# Patient Record
Sex: Female | Born: 1940 | Race: White | Hispanic: No | Smoking: Former smoker
Health system: Southern US, Community
[De-identification: ages and names within clinical notes are randomized; demographics above are authoritative.]

## PROBLEM LIST (undated history)

## (undated) DIAGNOSIS — T8859XA Other complications of anesthesia, initial encounter: Secondary | ICD-10-CM

## (undated) DIAGNOSIS — C569 Malignant neoplasm of unspecified ovary: Secondary | ICD-10-CM

## (undated) DIAGNOSIS — I2510 Atherosclerotic heart disease of native coronary artery without angina pectoris: Secondary | ICD-10-CM

## (undated) DIAGNOSIS — M1990 Unspecified osteoarthritis, unspecified site: Secondary | ICD-10-CM

## (undated) DIAGNOSIS — J449 Chronic obstructive pulmonary disease, unspecified: Secondary | ICD-10-CM

## (undated) DIAGNOSIS — T4145XA Adverse effect of unspecified anesthetic, initial encounter: Secondary | ICD-10-CM

## (undated) DIAGNOSIS — Z972 Presence of dental prosthetic device (complete) (partial): Secondary | ICD-10-CM

## (undated) DIAGNOSIS — C801 Malignant (primary) neoplasm, unspecified: Secondary | ICD-10-CM

## (undated) DIAGNOSIS — K219 Gastro-esophageal reflux disease without esophagitis: Secondary | ICD-10-CM

## (undated) DIAGNOSIS — I499 Cardiac arrhythmia, unspecified: Secondary | ICD-10-CM

## (undated) DIAGNOSIS — E7800 Pure hypercholesterolemia, unspecified: Secondary | ICD-10-CM

## (undated) HISTORY — PX: OTHER SURGICAL HISTORY: SHX169

## (undated) HISTORY — PX: ABDOMINAL HYSTERECTOMY: SHX81

## (undated) HISTORY — PX: TONSILLECTOMY: SUR1361

---

## 1970-04-19 DIAGNOSIS — C569 Malignant neoplasm of unspecified ovary: Secondary | ICD-10-CM

## 1970-04-19 HISTORY — DX: Malignant neoplasm of unspecified ovary: C56.9

## 2005-06-11 ENCOUNTER — Ambulatory Visit: Payer: Self-pay | Admitting: Podiatry

## 2007-04-20 HISTORY — PX: LUNG LOBECTOMY: SHX167

## 2008-04-19 DIAGNOSIS — I2510 Atherosclerotic heart disease of native coronary artery without angina pectoris: Secondary | ICD-10-CM

## 2008-04-19 DIAGNOSIS — C801 Malignant (primary) neoplasm, unspecified: Secondary | ICD-10-CM

## 2008-04-19 HISTORY — DX: Atherosclerotic heart disease of native coronary artery without angina pectoris: I25.10

## 2008-04-19 HISTORY — DX: Malignant (primary) neoplasm, unspecified: C80.1

## 2008-05-17 ENCOUNTER — Ambulatory Visit: Payer: Self-pay | Admitting: Family Medicine

## 2008-05-20 ENCOUNTER — Ambulatory Visit: Payer: Self-pay | Admitting: Oncology

## 2008-05-27 ENCOUNTER — Ambulatory Visit: Payer: Self-pay | Admitting: Oncology

## 2008-05-30 ENCOUNTER — Ambulatory Visit: Payer: Self-pay | Admitting: Oncology

## 2008-06-05 ENCOUNTER — Ambulatory Visit: Payer: Self-pay | Admitting: Surgery

## 2008-06-12 ENCOUNTER — Inpatient Hospital Stay: Payer: Self-pay | Admitting: Surgery

## 2008-06-17 ENCOUNTER — Ambulatory Visit: Payer: Self-pay | Admitting: Oncology

## 2008-06-21 ENCOUNTER — Ambulatory Visit: Payer: Self-pay | Admitting: Oncology

## 2008-07-03 ENCOUNTER — Ambulatory Visit: Payer: Self-pay | Admitting: Surgery

## 2008-07-18 ENCOUNTER — Ambulatory Visit: Payer: Self-pay | Admitting: Oncology

## 2008-10-17 ENCOUNTER — Ambulatory Visit: Payer: Self-pay | Admitting: Oncology

## 2008-11-04 ENCOUNTER — Ambulatory Visit: Payer: Self-pay | Admitting: Oncology

## 2008-11-13 ENCOUNTER — Ambulatory Visit: Payer: Self-pay | Admitting: Oncology

## 2008-11-17 ENCOUNTER — Ambulatory Visit: Payer: Self-pay | Admitting: Oncology

## 2009-04-19 ENCOUNTER — Ambulatory Visit: Payer: Self-pay | Admitting: Oncology

## 2009-05-07 ENCOUNTER — Ambulatory Visit: Payer: Self-pay | Admitting: Oncology

## 2009-05-08 ENCOUNTER — Ambulatory Visit: Payer: Self-pay | Admitting: Oncology

## 2009-05-20 ENCOUNTER — Ambulatory Visit: Payer: Self-pay | Admitting: Oncology

## 2009-05-29 ENCOUNTER — Ambulatory Visit: Payer: Self-pay | Admitting: Family Medicine

## 2009-10-17 ENCOUNTER — Ambulatory Visit: Payer: Self-pay | Admitting: Oncology

## 2009-11-05 ENCOUNTER — Ambulatory Visit: Payer: Self-pay | Admitting: Oncology

## 2009-11-10 ENCOUNTER — Ambulatory Visit: Payer: Self-pay | Admitting: Oncology

## 2009-11-17 ENCOUNTER — Ambulatory Visit: Payer: Self-pay | Admitting: Oncology

## 2010-05-13 ENCOUNTER — Ambulatory Visit: Payer: Self-pay | Admitting: Oncology

## 2010-05-18 ENCOUNTER — Ambulatory Visit: Payer: Self-pay | Admitting: Oncology

## 2010-05-20 ENCOUNTER — Ambulatory Visit: Payer: Self-pay | Admitting: Oncology

## 2011-05-25 ENCOUNTER — Ambulatory Visit: Payer: Self-pay | Admitting: Oncology

## 2011-05-25 LAB — COMPREHENSIVE METABOLIC PANEL
Albumin: 3.5 g/dL (ref 3.4–5.0)
Alkaline Phosphatase: 73 U/L (ref 50–136)
BUN: 6 mg/dL — ABNORMAL LOW (ref 7–18)
Calcium, Total: 8.9 mg/dL (ref 8.5–10.1)
Creatinine: 0.82 mg/dL (ref 0.60–1.30)
Potassium: 3 mmol/L — ABNORMAL LOW (ref 3.5–5.1)
SGOT(AST): 17 U/L (ref 15–37)
SGPT (ALT): 22 U/L
Sodium: 140 mmol/L (ref 136–145)

## 2011-05-25 LAB — CBC CANCER CENTER
Basophil %: 0.2 %
Eosinophil #: 0.1 x10 3/mm (ref 0.0–0.7)
Lymphocyte #: 1.7 x10 3/mm (ref 1.0–3.6)
MCH: 31.9 pg (ref 26.0–34.0)
MCHC: 33.8 g/dL (ref 32.0–36.0)
MCV: 95 fL (ref 80–100)
Monocyte #: 0.7 x10 3/mm (ref 0.0–0.7)
Neutrophil %: 71.2 %
Platelet: 209 x10 3/mm (ref 150–440)

## 2011-06-18 ENCOUNTER — Ambulatory Visit: Payer: Self-pay | Admitting: Oncology

## 2011-11-05 ENCOUNTER — Ambulatory Visit: Payer: Self-pay | Admitting: Physical Medicine and Rehabilitation

## 2011-11-05 LAB — CREATININE, SERUM
Creatinine: 0.8 mg/dL (ref 0.60–1.30)
EGFR (Non-African Amer.): >60

## 2011-11-25 ENCOUNTER — Ambulatory Visit: Payer: Self-pay | Admitting: Oncology

## 2011-11-25 LAB — BASIC METABOLIC PANEL
BUN: 13 mg/dL (ref 7–18)
Calcium, Total: 8.3 mg/dL — ABNORMAL LOW (ref 8.5–10.1)
Chloride: 105 mmol/L (ref 98–107)
Creatinine: 0.86 mg/dL (ref 0.60–1.30)
EGFR (African American): >60
EGFR (Non-African Amer.): >60
Osmolality: 281 (ref 275–301)
Sodium: 140 mmol/L (ref 136–145)

## 2011-11-25 LAB — CBC CANCER CENTER
Basophil %: 0.6 %
Eosinophil %: 1.2 %
HCT: 39.9 % (ref 35.0–47.0)
HGB: 13.4 g/dL (ref 12.0–16.0)
Lymphocyte %: 20.1 %
Monocyte %: 8.6 %
Neutrophil #: 7.5 x10 3/mm — ABNORMAL HIGH (ref 1.4–6.5)
Neutrophil %: 69.5 %
RBC: 4.17 10*6/uL (ref 3.80–5.20)

## 2011-12-19 ENCOUNTER — Ambulatory Visit: Payer: Self-pay | Admitting: Oncology

## 2011-12-28 ENCOUNTER — Other Ambulatory Visit: Payer: Self-pay | Admitting: Neurosurgery

## 2012-01-03 ENCOUNTER — Ambulatory Visit (HOSPITAL_COMMUNITY)
Admission: RE | Admit: 2012-01-03 | Discharge: 2012-01-03 | Disposition: A | Discharge status: Home / Self Care | Payer: Medicare Other | Source: Ambulatory Visit | Attending: Neurosurgery | Admitting: Neurosurgery

## 2012-01-03 ENCOUNTER — Encounter (HOSPITAL_COMMUNITY): Payer: Self-pay

## 2012-01-03 ENCOUNTER — Encounter (HOSPITAL_COMMUNITY)
Admission: RE | Admit: 2012-01-03 | Discharge: 2012-01-03 | Disposition: A | Discharge status: Home / Self Care | Payer: Medicare Other | Source: Ambulatory Visit | Attending: Neurosurgery | Admitting: Neurosurgery

## 2012-01-03 DIAGNOSIS — Z01810 Encounter for preprocedural cardiovascular examination: Secondary | ICD-10-CM | POA: Insufficient documentation

## 2012-01-03 DIAGNOSIS — Z01818 Encounter for other preprocedural examination: Secondary | ICD-10-CM | POA: Insufficient documentation

## 2012-01-03 DIAGNOSIS — Z01812 Encounter for preprocedural laboratory examination: Secondary | ICD-10-CM | POA: Insufficient documentation

## 2012-01-03 HISTORY — DX: Atherosclerotic heart disease of native coronary artery without angina pectoris: I25.10

## 2012-01-03 HISTORY — DX: Adverse effect of unspecified anesthetic, initial encounter: T41.45XA

## 2012-01-03 HISTORY — DX: Unspecified osteoarthritis, unspecified site: M19.90

## 2012-01-03 HISTORY — DX: Malignant (primary) neoplasm, unspecified: C80.1

## 2012-01-03 HISTORY — DX: Other complications of anesthesia, initial encounter: T88.59XA

## 2012-01-03 LAB — BASIC METABOLIC PANEL
Calcium: 9.3 mg/dL (ref 8.4–10.5)
Creatinine, Ser: 0.62 mg/dL (ref 0.50–1.10)
GFR calc Af Amer: >90 mL/min (ref >90)
Sodium: 138 mEq/L (ref 135–145)

## 2012-01-03 LAB — SURGICAL PCR SCREEN
MRSA, PCR: NEGATIVE
Staphylococcus aureus: NEGATIVE

## 2012-01-03 LAB — CBC
Platelets: 204 10*3/uL (ref 150–400)
RBC: 4.18 MIL/uL (ref 3.87–5.11)
RDW: 13 % (ref 11.5–15.5)
WBC: 9.9 10*3/uL (ref 4.0–10.5)

## 2012-01-03 NOTE — Pre-Procedure Instructions (Signed)
20 Bridget Deleon  01/03/2012   Your procedure is scheduled on:  Thursday Sept 19th   2013  Report to [facility] at 0945 AM.per Dr Lovell Sheehan  Call this number if you have problems the morning of surgery: [phone]   Remember:   Do not eat food or drink :After Midnight.    Take these medicines the morning of surgery with A SIP OF WATER: diltiazem  prilose  Percocet     Do not wear jewelry, make-up or nail polish.  Do not wear lotions, powders, or perfumes. You may wear deodorant.  Do not shave 48 hours prior to surgery. Men may shave face and neck.  Do not bring valuables to the hospital.  Contacts, dentures or bridgework may not be worn into surgery.  Leave suitcase in the car. After surgery it may be brought to your room.  For patients admitted to the hospital, checkout time is 11:00 AM the day of discharge.   Patients discharged the day of surgery will not be allowed to drive home.  Name and phone number of your driver: [name]... Son in Social worker.   Special Instructions: CHG Shower Use Special Wash: 1/2 bottle night before surgery and 1/2 bottle morning of surgery.   Please read over the following fact sheets that you were given: Coughing and Deep Breathing, Lab Information, MRSA Information and Surgical Site Infection Prevention

## 2012-01-05 MED ORDER — CEFAZOLIN SODIUM-DEXTROSE 2-3 GM-% IV SOLR
2.0000 g | INTRAVENOUS | Status: AC
  Administered 2012-01-06: 2 g via INTRAVENOUS
  Filled 2012-01-05: qty 50

## 2012-01-06 ENCOUNTER — Inpatient Hospital Stay (HOSPITAL_COMMUNITY)
Admission: RE | Admit: 2012-01-06 | Discharge: 2012-01-06 | DRG: 491 | Disposition: A | Discharge status: Home / Self Care | Payer: Medicare Other | Source: Ambulatory Visit | Attending: Neurosurgery | Admitting: Neurosurgery

## 2012-01-06 ENCOUNTER — Encounter (HOSPITAL_COMMUNITY): Payer: Self-pay | Admitting: *Deleted

## 2012-01-06 ENCOUNTER — Encounter (HOSPITAL_COMMUNITY): Payer: Self-pay | Admitting: Anesthesiology

## 2012-01-06 ENCOUNTER — Encounter (HOSPITAL_COMMUNITY)
Admission: RE | Disposition: A | Discharge status: Home / Self Care | Payer: Self-pay | Source: Ambulatory Visit | Attending: Neurosurgery

## 2012-01-06 ENCOUNTER — Inpatient Hospital Stay (HOSPITAL_COMMUNITY): Payer: Medicare Other

## 2012-01-06 ENCOUNTER — Inpatient Hospital Stay (HOSPITAL_COMMUNITY): Payer: Medicare Other | Admitting: Anesthesiology

## 2012-01-06 DIAGNOSIS — I2510 Atherosclerotic heart disease of native coronary artery without angina pectoris: Secondary | ICD-10-CM | POA: Diagnosis present

## 2012-01-06 DIAGNOSIS — M5126 Other intervertebral disc displacement, lumbar region: Principal | ICD-10-CM | POA: Diagnosis present

## 2012-01-06 DIAGNOSIS — Z85118 Personal history of other malignant neoplasm of bronchus and lung: Secondary | ICD-10-CM

## 2012-01-06 HISTORY — PX: LUMBAR LAMINECTOMY/DECOMPRESSION MICRODISCECTOMY: SHX5026

## 2012-01-06 SURGERY — LUMBAR LAMINECTOMY/DECOMPRESSION MICRODISCECTOMY 1 LEVEL
Anesthesia: General | Site: Back | Laterality: Right | Wound class: Clean

## 2012-01-06 MED ORDER — ACETAMINOPHEN 650 MG RE SUPP: 650.0000 mg | RECTAL | Status: DC | PRN

## 2012-01-06 MED ORDER — ONDANSETRON HCL 4 MG/2ML IJ SOLN: 4.0000 mg | INTRAMUSCULAR | Status: DC | PRN

## 2012-01-06 MED ORDER — BACITRACIN ZINC 500 UNIT/GM EX OINT
TOPICAL_OINTMENT | CUTANEOUS | Status: DC | PRN
  Administered 2012-01-06: 1 via TOPICAL

## 2012-01-06 MED ORDER — ROCURONIUM BROMIDE 100 MG/10ML IV SOLN
INTRAVENOUS | Status: DC | PRN
  Administered 2012-01-06: 50 mg via INTRAVENOUS

## 2012-01-06 MED ORDER — CALCIUM CARBONATE-VITAMIN D 500-200 MG-UNIT PO TABS: 2.0000 | ORAL_TABLET | Freq: Every day | ORAL | Status: DC

## 2012-01-06 MED ORDER — BACITRACIN 50000 UNITS IM SOLR
INTRAMUSCULAR | Status: AC
  Filled 2012-01-06: qty 1

## 2012-01-06 MED ORDER — BELLADONNA ALK-PHENOBARBITAL 16.2 MG PO TABS
1.0000 | ORAL_TABLET | ORAL | Status: DC | PRN
  Filled 2012-01-06: qty 1

## 2012-01-06 MED ORDER — OXYCODONE-ACETAMINOPHEN 10-325 MG PO TABS: 1.0000 | ORAL_TABLET | ORAL | Status: DC | PRN

## 2012-01-06 MED ORDER — GLYCOPYRROLATE 0.2 MG/ML IJ SOLN
INTRAMUSCULAR | Status: DC | PRN
  Administered 2012-01-06: 0.4 mg via INTRAVENOUS

## 2012-01-06 MED ORDER — VITAMIN D3 25 MCG (1000 UNIT) PO TABS: 1000.0000 [IU] | ORAL_TABLET | Freq: Every morning | ORAL | Status: DC

## 2012-01-06 MED ORDER — BUPIVACAINE-EPINEPHRINE PF 0.5-1:200000 % IJ SOLN
INTRAMUSCULAR | Status: DC | PRN
  Administered 2012-01-06: 30 mL

## 2012-01-06 MED ORDER — FENTANYL CITRATE 0.05 MG/ML IJ SOLN
INTRAMUSCULAR | Status: DC | PRN
  Administered 2012-01-06: 100 ug via INTRAVENOUS

## 2012-01-06 MED ORDER — DIAZEPAM 5 MG PO TABS: 5.0000 mg | ORAL_TABLET | Freq: Four times a day (QID) | ORAL | Status: DC | PRN

## 2012-01-06 MED ORDER — MENTHOL 3 MG MT LOZG: 1.0000 | LOZENGE | OROMUCOSAL | Status: DC | PRN

## 2012-01-06 MED ORDER — LIDOCAINE HCL (CARDIAC) 20 MG/ML IV SOLN
INTRAVENOUS | Status: DC | PRN
  Administered 2012-01-06: 60 mg via INTRAVENOUS

## 2012-01-06 MED ORDER — ACETAMINOPHEN 325 MG PO TABS: 650.0000 mg | ORAL_TABLET | ORAL | Status: DC | PRN

## 2012-01-06 MED ORDER — HEMOSTATIC AGENTS (NO CHARGE) OPTIME
TOPICAL | Status: DC | PRN
  Administered 2012-01-06: 1 via TOPICAL

## 2012-01-06 MED ORDER — PROPOFOL 10 MG/ML IV BOLUS
INTRAVENOUS | Status: DC | PRN
  Administered 2012-01-06: 160 mg via INTRAVENOUS

## 2012-01-06 MED ORDER — PHENOL 1.4 % MT LIQD: 1.0000 | OROMUCOSAL | Status: DC | PRN

## 2012-01-06 MED ORDER — DIAZEPAM 5 MG PO TABS
5.0000 mg | ORAL_TABLET | Freq: Four times a day (QID) | ORAL | Status: DC | PRN
Start: 2012-01-06 — End: 2018-01-10

## 2012-01-06 MED ORDER — SENNOSIDES-DOCUSATE SODIUM 8.6-50 MG PO TABS
2.0000 | ORAL_TABLET | Freq: Every evening | ORAL | Status: DC | PRN
  Filled 2012-01-06: qty 2

## 2012-01-06 MED ORDER — OXYCODONE-ACETAMINOPHEN 5-325 MG PO TABS: 1.0000 | ORAL_TABLET | ORAL | Status: DC | PRN

## 2012-01-06 MED ORDER — CALCIUM-D 600-400 MG-UNIT PO TABS: 1.0000 | ORAL_TABLET | Freq: Every morning | ORAL | Status: DC

## 2012-01-06 MED ORDER — DILTIAZEM HCL ER 120 MG PO CP24: 120.0000 mg | ORAL_CAPSULE | Freq: Every morning | ORAL | Status: DC

## 2012-01-06 MED ORDER — 0.9 % SODIUM CHLORIDE (POUR BTL) OPTIME
TOPICAL | Status: DC | PRN
  Administered 2012-01-06: 1000 mL

## 2012-01-06 MED ORDER — FENTANYL CITRATE 0.05 MG/ML IJ SOLN: 25.0000 ug | INTRAMUSCULAR | Status: DC | PRN

## 2012-01-06 MED ORDER — DOCUSATE SODIUM 100 MG PO CAPS
100.0000 mg | ORAL_CAPSULE | Freq: Two times a day (BID) | ORAL | Status: DC
  Filled 2012-01-06: qty 1

## 2012-01-06 MED ORDER — DIPHENHYDRAMINE HCL 25 MG PO CAPS
25.0000 mg | ORAL_CAPSULE | Freq: Four times a day (QID) | ORAL | Status: DC | PRN
  Administered 2012-01-06: 25 mg via ORAL
  Filled 2012-01-06: qty 1

## 2012-01-06 MED ORDER — MORPHINE SULFATE 2 MG/ML IJ SOLN
1.0000 mg | INTRAMUSCULAR | Status: DC | PRN
  Administered 2012-01-06: 2 mg via INTRAVENOUS
  Filled 2012-01-06: qty 1

## 2012-01-06 MED ORDER — SODIUM CHLORIDE 0.9 % IR SOLN
Status: DC | PRN
  Administered 2012-01-06: 13:00:00

## 2012-01-06 MED ORDER — NEOSTIGMINE METHYLSULFATE 1 MG/ML IJ SOLN
INTRAMUSCULAR | Status: DC | PRN
  Administered 2012-01-06: 3 mg via INTRAVENOUS

## 2012-01-06 MED ORDER — THROMBIN 5000 UNITS EX SOLR
CUTANEOUS | Status: DC | PRN
  Administered 2012-01-06 (×2): 5000 [IU] via TOPICAL

## 2012-01-06 MED ORDER — LACTATED RINGERS IV SOLN: INTRAVENOUS | Status: DC

## 2012-01-06 MED ORDER — ONDANSETRON HCL 4 MG/2ML IJ SOLN
INTRAMUSCULAR | Status: DC | PRN
  Administered 2012-01-06: 4 mg via INTRAVENOUS

## 2012-01-06 MED ORDER — ATORVASTATIN CALCIUM 10 MG PO TABS
10.0000 mg | ORAL_TABLET | Freq: Every day | ORAL | Status: DC
  Filled 2012-01-06: qty 1

## 2012-01-06 MED ORDER — ZOLPIDEM TARTRATE 5 MG PO TABS: 5.0000 mg | ORAL_TABLET | Freq: Every evening | ORAL | Status: DC | PRN

## 2012-01-06 MED ORDER — PANTOPRAZOLE SODIUM 40 MG PO TBEC: 40.0000 mg | DELAYED_RELEASE_TABLET | Freq: Every day | ORAL | Status: DC

## 2012-01-06 MED ORDER — VANCOMYCIN HCL IN DEXTROSE 1-5 GM/200ML-% IV SOLN
1000.0000 mg | Freq: Once | INTRAVENOUS | Status: DC
  Filled 2012-01-06: qty 200

## 2012-01-06 MED ORDER — LACTATED RINGERS IV SOLN
INTRAVENOUS | Status: DC | PRN
  Administered 2012-01-06 (×2): via INTRAVENOUS

## 2012-01-06 MED ORDER — SIMVASTATIN 20 MG PO TABS: 20.0000 mg | ORAL_TABLET | Freq: Every day | ORAL | Status: DC

## 2012-01-06 MED ORDER — OXYCODONE HCL 5 MG PO TABS: 5.0000 mg | ORAL_TABLET | ORAL | Status: DC | PRN

## 2012-01-06 MED ORDER — MIDAZOLAM HCL 5 MG/5ML IJ SOLN
INTRAMUSCULAR | Status: DC | PRN
  Administered 2012-01-06: 2 mg via INTRAVENOUS

## 2012-01-06 MED ORDER — SODIUM CHLORIDE 0.9 % IV SOLN
INTRAVENOUS | Status: AC
  Filled 2012-01-06: qty 500

## 2012-01-06 SURGICAL SUPPLY — 55 items
BAG DECANTER FOR FLEXI CONT (MISCELLANEOUS) ×2 IMPLANT
BENZOIN TINCTURE PRP APPL 2/3 (GAUZE/BANDAGES/DRESSINGS) ×2 IMPLANT
BLADE SURG ROTATE 9660 (MISCELLANEOUS) IMPLANT
BRUSH SCRUB EZ PLAIN DRY (MISCELLANEOUS) ×2 IMPLANT
BUR ACORN 6.0 (BURR) ×2 IMPLANT
BUR MATCHSTICK NEURO 3.0 LAGG (BURR) ×2 IMPLANT
CANISTER SUCTION 2500CC (MISCELLANEOUS) ×2 IMPLANT
CLOTH BEACON ORANGE TIMEOUT ST (SAFETY) ×2 IMPLANT
CONT SPEC 4OZ CLIKSEAL STRL BL (MISCELLANEOUS) ×2 IMPLANT
DRAPE LAPAROTOMY 100X72X124 (DRAPES) ×2 IMPLANT
DRAPE MICROSCOPE LEICA (MISCELLANEOUS) ×2 IMPLANT
DRAPE POUCH INSTRU U-SHP 10X18 (DRAPES) ×2 IMPLANT
DRAPE SURG 17X23 STRL (DRAPES) ×8 IMPLANT
ELECT BLADE 4.0 EZ CLEAN MEGAD (MISCELLANEOUS) ×2
ELECT REM PT RETURN 9FT ADLT (ELECTROSURGICAL) ×2
ELECTRODE BLDE 4.0 EZ CLN MEGD (MISCELLANEOUS) ×1 IMPLANT
ELECTRODE REM PT RTRN 9FT ADLT (ELECTROSURGICAL) ×1 IMPLANT
GAUZE SPONGE 4X4 16PLY XRAY LF (GAUZE/BANDAGES/DRESSINGS) IMPLANT
GLOVE BIO SURGEON STRL SZ8.5 (GLOVE) ×2 IMPLANT
GLOVE BIOGEL PI IND STRL 8 (GLOVE) ×2 IMPLANT
GLOVE BIOGEL PI INDICATOR 8 (GLOVE) ×2
GLOVE ECLIPSE 7.5 STRL STRAW (GLOVE) ×4 IMPLANT
GLOVE EXAM NITRILE LRG STRL (GLOVE) IMPLANT
GLOVE EXAM NITRILE MD LF STRL (GLOVE) IMPLANT
GLOVE EXAM NITRILE XL STR (GLOVE) IMPLANT
GLOVE EXAM NITRILE XS STR PU (GLOVE) IMPLANT
GLOVE INDICATOR 8.0 STRL GRN (GLOVE) ×2 IMPLANT
GLOVE INDICATOR 8.5 STRL (GLOVE) ×2 IMPLANT
GLOVE SS BIOGEL STRL SZ 8 (GLOVE) ×1 IMPLANT
GLOVE SUPERSENSE BIOGEL SZ 8 (GLOVE) ×1
GOWN BRE IMP SLV AUR LG STRL (GOWN DISPOSABLE) IMPLANT
GOWN BRE IMP SLV AUR XL STRL (GOWN DISPOSABLE) ×6 IMPLANT
GOWN STRL REIN 2XL LVL4 (GOWN DISPOSABLE) ×4 IMPLANT
KIT BASIN OR (CUSTOM PROCEDURE TRAY) ×2 IMPLANT
KIT ROOM TURNOVER OR (KITS) ×2 IMPLANT
NEEDLE HYPO 21X1.5 SAFETY (NEEDLE) IMPLANT
NEEDLE HYPO 22GX1.5 SAFETY (NEEDLE) ×4 IMPLANT
NS IRRIG 1000ML POUR BTL (IV SOLUTION) ×2 IMPLANT
PACK LAMINECTOMY NEURO (CUSTOM PROCEDURE TRAY) ×2 IMPLANT
PAD ARMBOARD 7.5X6 YLW CONV (MISCELLANEOUS) ×6 IMPLANT
PATTIES SURGICAL .5 X1 (DISPOSABLE) IMPLANT
RUBBERBAND STERILE (MISCELLANEOUS) ×4 IMPLANT
SPONGE GAUZE 4X4 12PLY (GAUZE/BANDAGES/DRESSINGS) ×2 IMPLANT
SPONGE SURGIFOAM ABS GEL SZ50 (HEMOSTASIS) ×2 IMPLANT
STRIP CLOSURE SKIN 1/2X4 (GAUZE/BANDAGES/DRESSINGS) ×2 IMPLANT
SUT VIC AB 1 CT1 18XBRD ANBCTR (SUTURE) ×1 IMPLANT
SUT VIC AB 1 CT1 8-18 (SUTURE) ×1
SUT VIC AB 2-0 CP2 18 (SUTURE) ×2 IMPLANT
SYR 20CC LL (SYRINGE) IMPLANT
SYR 20ML ECCENTRIC (SYRINGE) ×2 IMPLANT
SYR CONTROL 10ML LL (SYRINGE) ×2 IMPLANT
TAPE CLOTH SURG 4X10 WHT LF (GAUZE/BANDAGES/DRESSINGS) ×2 IMPLANT
TOWEL OR 17X24 6PK STRL BLUE (TOWEL DISPOSABLE) ×2 IMPLANT
TOWEL OR 17X26 10 PK STRL BLUE (TOWEL DISPOSABLE) ×2 IMPLANT
WATER STERILE IRR 1000ML POUR (IV SOLUTION) ×2 IMPLANT

## 2012-01-06 NOTE — Op Note (Signed)
Brief history: The patient is a 71 year old white female who has complained of back and right leg pain consistent with a lumbar radiculopathy. She has failed medical management and was worked up with a lumbar MRI. This demonstrated patient had a herniated disc and spinal stenosis at L45. I discussed the various treatment option with the patient including surgery. The patient has weighed the risks, benefits, alternatives surgery and decided proceed with a L4-5 discectomy and decompression.  Preoperative diagnosis: L4-5 herniated disc, spinal stenosis, lumbar discopathy, lumbago  Postoperative diagnosis: The same  Procedure: Right L4-5 Intervertebral discectomy using micro-dissection  Surgeon: Dr. Delma Officer  Asst.: Dr. Aliene Beams  Anesthesia: Gen. endotracheal  Estimated blood loss: 75 cc  Drains: None  Complications: None  Description of procedure: The patient was brought to the operating room by the anesthesia team. General endotracheal anesthesia was induced. The patient was turned to the prone position on the Wilson frame. The patient's lumbosacral region was then prepared with Betadine scrub and Betadine solution. Sterile drapes were applied.  I then injected the area to be incised with Marcaine with epinephrine solution. I then used a scalpel to make a linear midline incision over the L4-5 intervertebral disc space. I then used electrocautery to perform a right sided subperiosteal dissection exposing the spinous process and lamina of L4 and L5. We obtained intraoperative radiograph to confirm our location. I then inserted the [facility] retractor for exposure.  We then brought the operative microscope into the field. Under its magnification and illumination we completed the microdissection. I used a high-speed drill to perform a laminotomy at L4 on the right. I then used a Kerrison punches to widen the laminotomy and removed the ligamentum flavum at L4-5 on the right. We then used  microdissection to free up the thecal sac and the L5 nerve root from the epidural tissue. I then used a Kerrison punch to perform a foraminotomy at about the right L5 nerve root. We then using the nerve root retractor to gently retract the thecal sac and the right L5 nerve root medially. This exposed the intervertebral disc which had migrated in a cephalad direction over the midline L4 vertebral body.. We identified the ruptured disc and remove it with the pituitary forceps. I then used he is we told to remove some redundant posterior longitudinal ligament from the L4-5 endplates further decompressing the thecal sac.  I then palpated along the ventral surface of the thecal sac and along exit route of the right L5 nerve root and noted that the neural structures were well decompressed. This completed the decompression.  We then obtained hemostasis using bipolar electrocautery. We irrigated the wound out with bacitracin solution. We then removed the retractor. We then reapproximated the patient's thoracolumbar fascia with interrupted #1 Vicryl suture. We then reapproximated the patient's subcutaneous tissue with interrupted 3-0 Vicryl suture. We then reapproximated patient's skin with Steri-Strips and benzoin. The was then coated with bacitracin ointment. The drapes were removed. The patient was subsequently returned to the supine position where they were extubated by the anesthesia team. The patient was then transported to the postanesthesia care unit in stable condition. All sponge instrument and needle counts were reportedly correct at the end of this case.

## 2012-01-06 NOTE — Progress Notes (Signed)
ANTIBIOTIC CONSULT NOTE - INITIAL  Pharmacy Consult for Vancomycin Indication: empiric post-op prophylaxis  Allergies  Allergen Reactions  . Iron Rash  . Penicillins Rash  . Vicodin (Hydrocodone-Acetaminophen) Other (See Comments)    Wide awake, just doesn't work    Patient Measurements: Height: 5\' 10"  (177.8 cm) Weight: 164 lb (74.39 kg) IBW/kg (Calculated) : 68.5   Vital Signs: Temp: 97.9 F (36.6 C) (09/19 1500) Temp src: Oral (09/19 0930) BP: 126/59 mmHg (09/19 1500) Pulse Rate: 77  (09/19 1500) Intake/Output from previous day:   Intake/Output from this shift: Total I/O In: 1200 [I.V.:1200] Out: 50 [Blood:50]  Labs:  [facility] 01/03/12 1602  WBC 9.9  HGB 12.9  PLT 204  LABCREA --  CREATININE 0.62   Estimated Creatinine Clearance: 71.8 ml/min (by C-G formula based on Cr of 0.62). No results found for this basename: VANCOTROUGH:2,VANCOPEAK:2,VANCORANDOM:2,GENTTROUGH:2,GENTPEAK:2,GENTRANDOM:2,TOBRATROUGH:2,TOBRAPEAK:2,TOBRARND:2,AMIKACINPEAK:2,AMIKACINTROU:2,AMIKACIN:2, in the last 72 hours   Microbiology: Recent Results (from the past 720 hour(s))  SURGICAL PCR SCREEN     Status: Normal   Collection Time   01/03/12  3:46 PM      Component Value Range Status Comment   MRSA, PCR NEGATIVE  NEGATIVE Final    Staphylococcus aureus NEGATIVE  NEGATIVE Final     Medical History: Past Medical History  Diagnosis Date  . Complication of anesthesia 23 yrs ago .     woke up on operating table   . Coronary artery disease 2010    woke up and states she was told  bottom of heart was asleep ... takes diltiazem for that reason .  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 Arthritis     Medications:  Prescriptions prior to admission  Medication Sig Dispense Refill  . alendronate (FOSAMAX) 70 MG tablet Take 70 mg by mouth every 7 (seven) days. On Saturday.  Take with a full glass of water on an empty stomach.      . belladonna alk-PHENObarbital (DONNATAL) 16.2 MG  tablet Take 1 tablet by mouth as needed. For cramping      . Calcium Carbonate-Vitamin D (CALCIUM-D) 600-400 MG-UNIT TABS Take 1 tablet by mouth every morning.      . cholecalciferol (VITAMIN D) 1000 UNITS tablet Take 1,000 Units by mouth every morning.      Marland Kitchen CRANBERRY PO Take 1,500 mg by mouth every morning.      . diltiazem (DILACOR XR) 120 MG 24 hr capsule Take 120 mg by mouth every morning.      Marland Kitchen ibuprofen (ADVIL,MOTRIN) 800 MG tablet Take 800 mg by mouth 2 (two) times daily as needed. For pain      . omeprazole (PRILOSEC) 20 MG capsule Take 20 mg by mouth every morning.      Marland Kitchen oxyCODONE-acetaminophen (PERCOCET) 10-325 MG per tablet Take 1 tablet by mouth 3 (three) times daily as needed. For pain      . pravastatin (PRAVACHOL) 40 MG tablet Take 40 mg by mouth every morning.      . senna-docusate (SENOKOT-S) 8.6-50 MG per tablet Take 2 tablets by mouth at bedtime as needed. For constipation      . DISCONTD: methocarbamol (ROBAXIN) 500 MG tablet Take 500 mg by mouth 2 (two) times daily as needed. For pain      . DISCONTD: predniSONE (STERAPRED UNI-PAK) 5 MG TABS Take 5-15 mg by mouth as needed. For rash       Assessment: 71 y.o. female s/p Right L4-5 Intervertebral discectomy. No drains  in place post-op. Received Ancef 2gm pre-op at 1152. To receive one dose of vancomycin post-op.   Goal of Therapy:  Vancomycin trough level 10-15 mcg/ml  Plan:  1. Vancomycin 1gm IV x 1 today at 2000 2. Pharmacy will sign off - please reconsult if needed.  Thanks, Christoper Fabian, PharmD, BCPS Clinical pharmacist, pager [phone] 01/06/2012 3:17 PM

## 2012-01-06 NOTE — Anesthesia Procedure Notes (Signed)
Procedure Name: Intubation Date/Time: 01/06/2012 11:45 AM Performed by: Jerilee Hoh Pre-anesthesia Checklist: Patient identified, Emergency Drugs available, Suction available and Patient being monitored Patient Re-evaluated:Patient Re-evaluated prior to inductionOxygen Delivery Method: Circle system utilized Preoxygenation: Pre-oxygenation with 100% oxygen Intubation Type: IV induction Ventilation: Mask ventilation without difficulty and Oral airway inserted - appropriate to patient size Laryngoscope Size: Mac and 3 Grade View: Grade I Tube type: Oral Tube size: 7.5 mm Number of attempts: 1 Airway Equipment and Method: Stylet Placement Confirmation: ETT inserted through vocal cords under direct vision,  positive ETCO2 and breath sounds checked- equal and bilateral Secured at: 22 cm Tube secured with: Tape Dental Injury: Teeth and Oropharynx as per pre-operative assessment

## 2012-01-06 NOTE — H&P (Signed)
Subjective: The patient is a 71 year old white female who has complained of back and right leg pain. She has failed medical management and was worked up with a lumbar MRI. This demonstrated a herniated disc at L4-5. I discussed the various treatment option with the patient including surgery. She has weighed the risks, benefits, and alternatives surgery and decided proceed with a L4-5 discectomy.   Past Medical History  Diagnosis Date  . Complication of anesthesia 23 yrs ago .     woke up on operating table   . Coronary artery disease 2010    woke up and states she was told  bottom of heart was asleep ... takes diltiazem for that reason .  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 Arthritis     Past Surgical History  Procedure Date  . Tonsillectomy   . Abdominal hysterectomy     No Known Allergies  History  Substance Use Topics  . Smoking status: Not on file  . Smokeless tobacco: Not on file  . Alcohol Use: No    No family history on file. Prior to Admission medications   Medication Sig Start Date End Date Taking? Authorizing Provider  alendronate (FOSAMAX) 70 MG tablet Take 70 mg by mouth every 7 (seven) days. On Saturday.  Take with a full glass of water on an empty stomach.   Yes Historical Provider, MD  belladonna alk-PHENObarbital (DONNATAL) 16.2 MG tablet Take 1 tablet by mouth as needed. For cramping   Yes Historical Provider, MD  Calcium Carbonate-Vitamin D (CALCIUM-D) 600-400 MG-UNIT TABS Take 1 tablet by mouth every morning.   Yes Historical Provider, MD  cholecalciferol (VITAMIN D) 1000 UNITS tablet Take 1,000 Units by mouth every morning.   Yes Historical Provider, MD  CRANBERRY PO Take 1,500 mg by mouth every morning.   Yes Historical Provider, MD  diltiazem (DILACOR XR) 120 MG 24 hr capsule Take 120 mg by mouth every morning.   Yes Historical Provider, MD  ibuprofen (ADVIL,MOTRIN) 800 MG tablet Take 800 mg by mouth 2 (two) times daily as needed. For pain   Yes  Historical Provider, MD  methocarbamol (ROBAXIN) 500 MG tablet Take 500 mg by mouth 2 (two) times daily as needed. For pain   Yes Historical Provider, MD  omeprazole (PRILOSEC) 20 MG capsule Take 20 mg by mouth every morning.   Yes Historical Provider, MD  oxyCODONE-acetaminophen (PERCOCET) 10-325 MG per tablet Take 1 tablet by mouth 3 (three) times daily as needed. For pain   Yes Historical Provider, MD  pravastatin (PRAVACHOL) 40 MG tablet Take 40 mg by mouth every morning.   Yes Historical Provider, MD  predniSONE (STERAPRED UNI-PAK) 5 MG TABS Take 5-15 mg by mouth as needed. For rash   Yes Historical Provider, MD  senna-docusate (SENOKOT-S) 8.6-50 MG per tablet Take 2 tablets by mouth at bedtime as needed. For constipation   Yes Historical Provider, MD     Review of Systems  Positive ROS: As above  All other systems have been reviewed and were otherwise negative with the exception of those mentioned in the HPI and as above.  Objective: Vital signs in last 24 hours:    General Appearance: Alert, cooperative, no distress, appears stated age Head: Normocephalic, without obvious abnormality, atraumatic Eyes: PERRL, conjunctiva/corneas clear, EOM's intact, fundi benign, both eyes      Ears: Normal TM's and external ear canals, both ears Throat: Lips, mucosa, and tongue normal; teeth and gums normal  Neck: Supple, symmetrical, trachea midline, no adenopathy; thyroid: No enlargement/tenderness/nodules; no carotid bruit or JVD Back: Symmetric, no curvature, ROM normal, no CVA tenderness Lungs: Clear to auscultation bilaterally, respirations unlabored Heart: Regular rate and rhythm, S1 and S2 normal, no murmur, rub or gallop Abdomen: Soft, non-tender, bowel sounds active all four quadrants, no masses, no organomegaly Extremities: Extremities normal, atraumatic, no cyanosis or edema Pulses: 2+ and symmetric all extremities Skin: Skin color, texture, turgor normal, no rashes or  lesions  NEUROLOGIC:   Mental status: alert and oriented, no aphasia, good attention span, Fund of knowledge/ memory ok Motor Exam - grossly normal except she has slightly decreased left extensor hallucis longus strength. Sensory Exam - grossly normal except that she has decreased sensation in the right L5 distribution Reflexes:  Coordination - grossly normal Gait - grossly normal Balance - grossly normal Cranial Nerves: I: smell Not tested  II: visual acuity  OS: Normal    OD: Normal   II: visual fields Full to confrontation  II: pupils Equal, round, reactive to light  III,VII: ptosis None  III,IV,VI: extraocular muscles  Full ROM  V: mastication Normal  V: facial light touch sensation  Normal  V,VII: corneal reflex  Present  VII: facial muscle function - upper  Normal  VII: facial muscle function - lower Normal  VIII: hearing Not tested  IX: soft palate elevation  Normal  IX,X: gag reflex Present  XI: trapezius strength  5/5  XI: sternocleidomastoid strength 5/5  XI: neck flexion strength  5/5  XII: tongue strength  Normal    Data Review Lab Results  Component Value Date   WBC 9.9 01/03/2012   HGB 12.9 01/03/2012   HCT 38.3 01/03/2012   MCV 91.6 01/03/2012   PLT 204 01/03/2012   Lab Results  Component Value Date   NA 138 01/03/2012   K 3.2* 01/03/2012   CL 102 01/03/2012   CO2 28 01/03/2012   BUN 10 01/03/2012   CREATININE 0.62 01/03/2012   GLUCOSE 101* 01/03/2012   No results found for this basename: INR, PROTIME    Assessment/Plan: Left L4-5 herniated disc, spinal stenosis, lumbar radiculopathy, lumbago: I have discussed the situation with the patient. I reviewed her MR scan with her and pointed out the abnormalities. We have discussed the various treatment options including surgery. I have described the surgical option of a right L4-5 discectomy. I've shown her surgical models. We have discussed the risks, benefits, alternatives and likelihood of achieving our goals  with surgery. I have answered all the patient's questions. She wants to proceed with the operation.   Bridget Deleon D 01/06/2012 9:11 AM

## 2012-01-06 NOTE — Anesthesia Preprocedure Evaluation (Addendum)
Anesthesia Evaluation  Patient identified by MRN, date of birth, ID band Patient awake    Reviewed: Allergy & Precautions, H&P , NPO status , Patient's Chart, lab work & pertinent test results  Airway Mallampati: II      Dental   Pulmonary neg pulmonary ROS,  breath sounds clear to auscultation        Cardiovascular + CAD Rhythm:Regular Rate:Normal     Neuro/Psych negative neurological ROS     GI/Hepatic Neg liver ROS,   Endo/Other  negative endocrine ROS  Renal/GU negative Renal ROS     Musculoskeletal   Abdominal   Peds  Hematology negative hematology ROS (+)   Anesthesia Other Findings   Reproductive/Obstetrics                          Anesthesia Physical Anesthesia Plan  ASA: III  Anesthesia Plan: General   Post-op Pain Management:    Induction: Intravenous  Airway Management Planned: Oral ETT  Additional Equipment:   Intra-op Plan:   Post-operative Plan:   Informed Consent:   Dental advisory given  Plan Discussed with: CRNA, Surgeon and Anesthesiologist  Anesthesia Plan Comments:        Anesthesia Quick Evaluation

## 2012-01-06 NOTE — Anesthesia Postprocedure Evaluation (Signed)
  Anesthesia Post-op Note  Patient: Bridget Deleon  Procedure(s) Performed: Procedure(s) (LRB) with comments: LUMBAR LAMINECTOMY/DECOMPRESSION MICRODISCECTOMY 1 LEVEL (Right) - RIGHT Lumbar four - five diskectomy  Patient Location: PACU  Anesthesia Type: General  Level of Consciousness: awake  Airway and Oxygen Therapy: Patient Spontanous Breathing  Post-op Pain: mild  Post-op Assessment: Post-op Vital signs reviewed  Post-op Vital Signs: Reviewed  Complications: No apparent anesthesia complications

## 2012-01-06 NOTE — Progress Notes (Signed)
Patient ID: Bridget Deleon, female   DOB: 01-09-1941, 71 y.o.   MRN: [id number] Subjective:  The patient is alert and pleasant. She looks well. She is in no apparent distress.  Objective: Vital signs in last 24 hours: Temp:  [97.7 F (36.5 C)-98.4 F (36.9 C)] 98.4 F (36.9 C) (09/19 1336) Pulse Rate:  [73] 73  (09/19 0930) Resp:  [18] 18  (09/19 0930) BP: (131)/(69) 131/69 mmHg (09/19 0930) SpO2:  [98 %] 98 % (09/19 0930)  Intake/Output from previous day:   Intake/Output this shift: Total I/O In: 1000 [I.V.:1000] Out: 50 [Blood:50]  Physical exam the patient is alert. She is moving all 4 extremities well.  Lab Results:  [facility] 01/03/12 1602  WBC 9.9  HGB 12.9  HCT 38.3  PLT 204   BMET  Basename 01/03/12 1602  NA 138  K 3.2*  CL 102  CO2 28  GLUCOSE 101*  BUN 10  CREATININE 0.62  CALCIUM 9.3    Studies/Results: No results found.  Assessment/Plan: The patient is doing well.  LOS: 0 days     Ronalda Walpole D 01/06/2012, 2:04 PM

## 2012-01-06 NOTE — Progress Notes (Signed)
Patient converted from simvastatin 20 mg prescription, to atorvastatin 10 mg prescription, per therapeutic interchange authorized by P&[employer] due to simvastatin drug interaction with diltiazem. The recommendation is that simvastatin 10mg  is the highest dosage that should be used concomitantly with diltiazem.  Thank you,  Tomi Bamberger, PharmD Clinical Pharmacist Pager: [phone] Pharmacy: [phone] 01/06/2012 3:30 PM

## 2012-01-06 NOTE — Transfer of Care (Signed)
Immediate Anesthesia Transfer of Care Note  Patient: Bridget Deleon  Procedure(s) Performed: Procedure(s) (LRB) with comments: LUMBAR LAMINECTOMY/DECOMPRESSION MICRODISCECTOMY 1 LEVEL (Right) - RIGHT Lumbar four - five diskectomy  Patient Location: PACU  Anesthesia Type: General  Level of Consciousness: awake, alert , oriented and patient cooperative  Airway & Oxygen Therapy: Patient Spontanous Breathing and Patient connected to nasal cannula oxygen  Post-op Assessment: Report given to PACU RN, Post -op Vital signs reviewed and stable and Patient moving all extremities  Post vital signs: Reviewed and stable  Complications: No apparent anesthesia complications

## 2012-01-06 NOTE — Preoperative (Signed)
Beta Blockers   Reason not to administer Beta Blockers:Not Applicable 

## 2012-01-06 NOTE — Progress Notes (Signed)
Pt doing well. Pt ambulated in the hallway without difficulty. Pt voided x 2. Pt's pain is controlled. Pt D/C'd home via wheelchair @ 1900 per MD order. Rema Fendt, RN

## 2012-01-07 ENCOUNTER — Encounter (HOSPITAL_COMMUNITY): Payer: Self-pay | Admitting: Neurosurgery

## 2012-01-10 NOTE — Discharge Summary (Signed)
  Physician Discharge Summary  Patient ID: Bridget Deleon MRN: [id number] DOB/AGE: 11-24-1940 71 y.o.  Admit date: 01/06/2012 Discharge date: 01/10/2012 01/06/12   Admission Diagnoses:Right L4-5 herniated disc, lumbar radiculopathy, lumbago  Discharge Diagnoses: The same  Active Problems:  * No active hospital problems. *    Discharged Condition: good  Hospital Course: I admitted the patient to [facility] on 01/06/2012. On that day I performed a right L4-5 discectomy. The patient's postoperative course was unremarkable and she was discharged to home that day. The patient was given oral and written discharge instructions.  Consults:None  Significant Diagnostic Studies:None  Treatments:Right L4-5 discectomy using microdissection  Discharge Exam: Blood pressure 128/69, pulse 89, temperature 98.8 F (37.1 C), temperature source Oral, resp. rate 18, height 5\' 10"  (1.778 m), weight 74.39 kg (164 lb), SpO2 95.00%. She is alert and oriented. Her strength is normal.  Disposition: Home     Medication List     As of 01/10/2012  7:33 AM    STOP taking these medications         methocarbamol 500 MG tablet   Commonly known as: ROBAXIN      predniSONE 5 MG Tabs   Commonly known as: STERAPRED UNI-PAK      TAKE these medications         alendronate 70 MG tablet   Commonly known as: FOSAMAX   Take 70 mg by mouth every 7 (seven) days. On Saturday.  Take with a full glass of water on an empty stomach.      belladonna alk-PHENObarbital 16.2 MG tablet   Commonly known as: DONNATAL   Take 1 tablet by mouth as needed. For cramping      Calcium-D 600-400 MG-UNIT Tabs   Take 1 tablet by mouth every morning.      cholecalciferol 1000 UNITS tablet   Commonly known as: VITAMIN D   Take 1,000 Units by mouth every morning.      CRANBERRY PO   Take 1,500 mg by mouth every morning.      diazepam 5 MG tablet   Commonly known as: VALIUM   Take 1 tablet (5 mg total) by mouth  every 6 (six) hours as needed for anxiety.      diltiazem 120 MG 24 hr capsule   Commonly known as: DILACOR XR   Take 120 mg by mouth every morning.      ibuprofen 800 MG tablet   Commonly known as: ADVIL,MOTRIN   Take 800 mg by mouth 2 (two) times daily as needed. For pain      omeprazole 20 MG capsule   Commonly known as: PRILOSEC   Take 20 mg by mouth every morning.      oxyCODONE-acetaminophen 10-325 MG per tablet   Commonly known as: PERCOCET   Take 1 tablet by mouth every 4 (four) hours as needed.      oxyCODONE-acetaminophen 10-325 MG per tablet   Commonly known as: PERCOCET   Take 1 tablet by mouth 3 (three) times daily as needed. For pain      pravastatin 40 MG tablet   Commonly known as: PRAVACHOL   Take 40 mg by mouth every morning.      senna-docusate 8.6-50 MG per tablet   Commonly known as: Senokot-S   Take 2 tablets by mouth at bedtime as needed. For constipation         Signed: Cristi Loron 01/10/2012, 7:33 AM

## 2012-02-14 ENCOUNTER — Ambulatory Visit: Payer: Self-pay

## 2012-05-24 ENCOUNTER — Ambulatory Visit: Payer: Self-pay | Admitting: Oncology

## 2012-06-17 ENCOUNTER — Ambulatory Visit: Payer: Self-pay | Admitting: Oncology

## 2012-07-17 ENCOUNTER — Ambulatory Visit: Payer: Self-pay | Admitting: Ophthalmology

## 2012-07-24 ENCOUNTER — Ambulatory Visit: Payer: Self-pay | Admitting: Ophthalmology

## 2012-08-07 ENCOUNTER — Ambulatory Visit: Payer: Self-pay | Admitting: Ophthalmology

## 2012-11-24 DIAGNOSIS — K589 Irritable bowel syndrome without diarrhea: Secondary | ICD-10-CM | POA: Insufficient documentation

## 2012-11-24 DIAGNOSIS — F419 Anxiety disorder, unspecified: Secondary | ICD-10-CM | POA: Insufficient documentation

## 2012-11-24 DIAGNOSIS — I10 Essential (primary) hypertension: Secondary | ICD-10-CM | POA: Insufficient documentation

## 2013-02-14 ENCOUNTER — Ambulatory Visit: Payer: Self-pay

## 2013-05-25 ENCOUNTER — Ambulatory Visit: Payer: Self-pay | Admitting: Oncology

## 2013-06-17 ENCOUNTER — Ambulatory Visit: Payer: Self-pay | Admitting: Oncology

## 2014-08-09 NOTE — Op Note (Signed)
PATIENT NAME:  Bridget Deleon, Bridget Deleon MR#:  [id number] DATE OF BIRTH:  07/28/1940  DATE OF PROCEDURE:  07/24/2012  PREOPERATIVE DIAGNOSIS: Cataract, left eye.   POSTOPERATIVE DIAGNOSIS: Cataract, left eye.   PROCEDURE PERFORMED: Extracapsular cataract extraction using phacoemulsification, placement of Alcon SN6CWS, 21.5 diopter posterior chamber lens, serial number [id number].   SURGEON: Loura Back. Aerilynn Goin, MD  ANESTHESIA: 4% lidocaine and 0.75% Marcaine in a 50:50 mixture with 10 units per mL of Hylenex added, given as a peribulbar.   ANESTHESIOLOGIST: Dr. Andree Elk.   COMPLICATIONS: None.   ESTIMATED BLOOD LOSS: Less than 1 mL.   CDE: 47.54.  DESCRIPTION OF PROCEDURE:  The patient was brought to the operating room and given a peribulbar block.  The patient was then prepped and draped in the usual fashion.  The vertical rectus muscles were imbricated using 5-0 silk sutures.  These sutures were then clamped to the sterile drapes as bridle sutures.  A limbal peritomy was performed extending two clock hours and hemostasis was obtained with cautery.  A partial thickness scleral groove was made at the surgical limbus and dissected anteriorly in a lamellar dissection using an Alcon crescent knife.  The anterior chamber was entered supero-temporally with a Superblade and through the lamellar dissection with a 2.6 mm keratome.  DisCoVisc was used to replace the aqueous and a continuous tear capsulorrhexis was carried out.  Hydrodissection and hydrodelineation were carried out with balanced salt and a 27 gauge canula.  The nucleus was rotated to confirm the effectiveness of the hydrodissection.  Phacoemulsification was carried out using a divide-and-conquer technique.  Total ultrasound time was 1 minute and 32.8 seconds with an average power of 25.9 percent.   Irrigation/aspiration was used to remove the residual cortex.  DisCoVisc was used to inflate the capsule and the internal incision was enlarged  to 3 mm with the crescent knife.  The intraocular lens was folded and inserted into the capsular bag using the AcrySert delivery system.  Irrigation/aspiration was used to remove the residual DisCoVisc.  Miostat was injected into the anterior chamber through the paracentesis track to inflate the anterior chamber and induce miosis.  The wound was checked for leaks and none were found. The conjunctiva was closed with cautery and the bridle sutures were removed.  Two drops of 0.3% Vigamox were placed on the eye.   An eye shield was placed on the eye.  The patient was discharged to the recovery room in good condition.     ____________________________ Loura Back Ardyth Kelso, MD sad:es D: 07/24/2012 14:24:16 ET T: 07/24/2012 14:34:21 ET JOB#: [id number]  cc: Remo Lipps A. Aleja Yearwood, MD, <Dictator> Martie Lee MD ELECTRONICALLY SIGNED 07/31/2012 13:28

## 2014-08-09 NOTE — Op Note (Signed)
PATIENT NAME:  Bridget Deleon, Bridget Deleon MR#:  [id number] DATE OF BIRTH:  1940-06-23  DATE OF PROCEDURE:  08/07/2012  PREOPERATIVE DIAGNOSIS:  Cataract, right eye.   POSTOPERATIVE DIAGNOSIS:  Cataract, right eye.  PROCEDURE PERFORMED:  Extracapsular cataract extraction using phacoemulsification with placement of an Alcon SN6CWS, 21.5-diopter posterior chamber lens, serial [id number].  SURGEON:  Loura Back. Lounell Schumacher, MD  ASSISTANT:  None.  ANESTHESIA:  4% lidocaine and 0.75% Marcaine in a 50/50 mixture with 10 units/mL of Hylenex added, given as a peribulbar.  ANESTHESIOLOGIST:  Dr. Benjamine Mola  COMPLICATIONS:  None.  ESTIMATED BLOOD LOSS:  Less than 1 mL.  DESCRIPTION OF PROCEDURE:  The patient was brought to the operating room and given a peribulbar block.  The patient was then prepped and draped in the usual fashion.  The vertical rectus muscles were imbricated using 5-0 silk sutures.  These sutures were then clamped to the sterile drapes as bridle sutures.  A limbal peritomy was performed extending two clock hours and hemostasis was obtained with cautery.  A partial thickness scleral groove was made at the surgical limbus and dissected anteriorly in a lamellar dissection using an Alcon crescent knife.  The anterior chamber was entered superonasally with a Superblade and through the lamellar dissection with a 2.6 mm keratome.  DisCoVisc was used to replace the aqueous and a continuous tear capsulorrhexis was carried out.  Hydrodissection and hydrodelineation were carried out with balanced salt and a 27 gauge canula.  The nucleus was rotated to confirm the effectiveness of the hydrodissection.  Phacoemulsification was carried out using a divide-and-conquer technique.  Total ultrasound time was 1 minute and 34.6 seconds with an average power of 24.6 percent.  CDE of 42.78.  Irrigation/aspiration was used to remove the residual cortex.  DisCoVisc was used to inflate the capsule and the internal incision  was enlarged to 3 mm with the crescent knife.  The intraocular lens was folded and inserted into the capsular bag using the AcrySert Delivery System.   Irrigation/aspiration was used to remove the residual DisCoVisc.  Miostat was injected into the anterior chamber through the paracentesis track to inflate the anterior chamber and induce miosis.  The wound was checked for leaks and none were found. The conjunctiva was closed with cautery and the bridle sutures were removed.  Two drops of 0.3% Vigamox were placed on the eye.   An eye shield was placed on the eye.  The patient was discharged to the recovery room in good condition.  ____________________________ Loura Back Nakai Pollio, MD sad:cb D: 08/07/2012 14:20:54 ET T: 08/07/2012 14:53:00 ET JOB#: [id number]  cc: Remo Lipps A. Amily Depp, MD, <Dictator> Martie Lee MD ELECTRONICALLY SIGNED 08/14/2012 14:21

## 2015-07-31 DIAGNOSIS — R7989 Other specified abnormal findings of blood chemistry: Secondary | ICD-10-CM | POA: Insufficient documentation

## 2016-12-28 ENCOUNTER — Other Ambulatory Visit: Payer: Self-pay | Admitting: Internal Medicine

## 2016-12-28 DIAGNOSIS — Z1239 Encounter for other screening for malignant neoplasm of breast: Secondary | ICD-10-CM

## 2017-01-13 ENCOUNTER — Ambulatory Visit
Admission: RE | Admit: 2017-01-13 | Discharge: 2017-01-13 | Disposition: A | Discharge status: Home / Self Care | Payer: Medicare Other | Source: Ambulatory Visit | Attending: Internal Medicine | Admitting: Internal Medicine

## 2017-01-13 DIAGNOSIS — Z1231 Encounter for screening mammogram for malignant neoplasm of breast: Secondary | ICD-10-CM | POA: Insufficient documentation

## 2017-01-13 DIAGNOSIS — Z1239 Encounter for other screening for malignant neoplasm of breast: Secondary | ICD-10-CM

## 2018-01-10 ENCOUNTER — Other Ambulatory Visit: Payer: Self-pay

## 2018-01-10 ENCOUNTER — Encounter: Payer: Self-pay | Admitting: *Deleted

## 2018-01-12 NOTE — Anesthesia Preprocedure Evaluation (Addendum)
Anesthesia Evaluation  Patient identified by MRN, date of birth, ID band Patient awake    Reviewed: Allergy & Precautions, NPO status , Patient's Chart, lab work & pertinent test results  History of Anesthesia Complications Negative for: history of anesthetic complications  Airway Mallampati: IV  TM Distance: >3 FB Neck ROM: Full    Dental  (+) Lower Dentures, Upper Dentures   Pulmonary former smoker (quit 2009),    Pulmonary exam normal breath sounds clear to auscultation       Cardiovascular negative cardio ROS Normal cardiovascular exam Rhythm:Regular Rate:Normal  Pt with excellent functional capacity.  Multiple flights of stairs with no difficulty.    Neuro/Psych negative neurological ROS     GI/Hepatic GERD  ,  Endo/Other  negative endocrine ROS  Renal/GU negative Renal ROS     Musculoskeletal  (+) Arthritis ,   Abdominal   Peds  Hematology Ovarian and lung CA   Anesthesia Other Findings   Reproductive/Obstetrics                           Anesthesia Physical Anesthesia Plan  ASA: II  Anesthesia Plan: General   Post-op Pain Management:    Induction: Intravenous  PONV Risk Score and Plan: 3 and Dexamethasone and Ondansetron  Airway Management Planned: Natural Airway  Additional Equipment:   Intra-op Plan:   Post-operative Plan:   Informed Consent: I have reviewed the patients History and Physical, chart, labs and discussed the procedure including the risks, benefits and alternatives for the proposed anesthesia with the patient or authorized representative who has indicated his/her understanding and acceptance.     Plan Discussed with: CRNA  Anesthesia Plan Comments:        Anesthesia Quick Evaluation

## 2018-01-18 ENCOUNTER — Ambulatory Visit: Payer: Medicare Other | Admitting: Anesthesiology

## 2018-01-18 ENCOUNTER — Ambulatory Visit
Admission: RE | Admit: 2018-01-18 | Discharge: 2018-01-18 | Disposition: A | Discharge status: Home / Self Care | Payer: Medicare Other | Source: Ambulatory Visit | Attending: Otolaryngology | Admitting: Otolaryngology

## 2018-01-18 ENCOUNTER — Encounter
Admission: RE | Disposition: A | Discharge status: Home / Self Care | Payer: Self-pay | Source: Ambulatory Visit | Attending: Otolaryngology

## 2018-01-18 DIAGNOSIS — H61001 Unspecified perichondritis of right external ear: Secondary | ICD-10-CM | POA: Diagnosis not present

## 2018-01-18 DIAGNOSIS — Z8543 Personal history of malignant neoplasm of ovary: Secondary | ICD-10-CM | POA: Diagnosis not present

## 2018-01-18 DIAGNOSIS — K219 Gastro-esophageal reflux disease without esophagitis: Secondary | ICD-10-CM | POA: Insufficient documentation

## 2018-01-18 DIAGNOSIS — Z87891 Personal history of nicotine dependence: Secondary | ICD-10-CM | POA: Diagnosis not present

## 2018-01-18 HISTORY — DX: Gastro-esophageal reflux disease without esophagitis: K21.9

## 2018-01-18 HISTORY — DX: Malignant neoplasm of unspecified ovary: C56.9

## 2018-01-18 HISTORY — PX: MASS EXCISION: SHX2000

## 2018-01-18 HISTORY — DX: Presence of dental prosthetic device (complete) (partial): Z97.2

## 2018-01-18 SURGERY — EXCISION MASS
Anesthesia: General | Site: Ear | Laterality: Right

## 2018-01-18 MED ORDER — LIDOCAINE HCL (CARDIAC) PF 100 MG/5ML IV SOSY
PREFILLED_SYRINGE | INTRAVENOUS | Status: DC | PRN
  Administered 2018-01-18: 40 mg via INTRAVENOUS

## 2018-01-18 MED ORDER — BACITRACIN 500 UNIT/GM EX OINT
TOPICAL_OINTMENT | CUTANEOUS | Status: DC | PRN
  Administered 2018-01-18: 1 via TOPICAL

## 2018-01-18 MED ORDER — ACETAMINOPHEN 10 MG/ML IV SOLN: 1000.0000 mg | Freq: Once | INTRAVENOUS | Status: DC | PRN

## 2018-01-18 MED ORDER — PROPOFOL 500 MG/50ML IV EMUL
INTRAVENOUS | Status: DC | PRN
  Administered 2018-01-18: 50 ug/kg/min via INTRAVENOUS

## 2018-01-18 MED ORDER — LACTATED RINGERS IV SOLN
INTRAVENOUS | Status: DC
  Administered 2018-01-18: 10:00:00 via INTRAVENOUS

## 2018-01-18 MED ORDER — LACTATED RINGERS IV SOLN: INTRAVENOUS | Status: DC

## 2018-01-18 MED ORDER — GENTAMICIN SULFATE 0.1 % EX OINT: 1.0000 "application " | TOPICAL_OINTMENT | Freq: Three times a day (TID) | CUTANEOUS | 0 refills | Status: DC

## 2018-01-18 MED ORDER — FENTANYL CITRATE (PF) 100 MCG/2ML IJ SOLN: 25.0000 ug | INTRAMUSCULAR | Status: DC | PRN

## 2018-01-18 MED ORDER — FENTANYL CITRATE (PF) 100 MCG/2ML IJ SOLN
INTRAMUSCULAR | Status: DC | PRN
  Administered 2018-01-18: 50 ug via INTRAVENOUS

## 2018-01-18 MED ORDER — OXYCODONE HCL 5 MG/5ML PO SOLN: 5.0000 mg | Freq: Once | ORAL | Status: DC | PRN

## 2018-01-18 MED ORDER — LIDOCAINE-EPINEPHRINE 1 %-1:100000 IJ SOLN
INTRAMUSCULAR | Status: DC | PRN
  Administered 2018-01-18: 1 mL

## 2018-01-18 MED ORDER — OXYCODONE HCL 5 MG PO TABS: 5.0000 mg | ORAL_TABLET | Freq: Once | ORAL | Status: DC | PRN

## 2018-01-18 MED ORDER — ONDANSETRON HCL 4 MG/2ML IJ SOLN
4.0000 mg | Freq: Once | INTRAMUSCULAR | Status: AC | PRN
  Administered 2018-01-18: 4 mg via INTRAVENOUS

## 2018-01-18 MED ORDER — MIDAZOLAM HCL 2 MG/2ML IJ SOLN
INTRAMUSCULAR | Status: DC | PRN
  Administered 2018-01-18: 2 mg via INTRAVENOUS

## 2018-01-18 MED ORDER — ONDANSETRON HCL 4 MG/2ML IJ SOLN
INTRAMUSCULAR | Status: DC | PRN
  Administered 2018-01-18: 4 mg via INTRAVENOUS

## 2018-01-18 SURGICAL SUPPLY — 18 items
APPLICATOR COTTON TIP WD 3 STR (MISCELLANEOUS) ×2 IMPLANT
CORD BIP STRL DISP 12FT (MISCELLANEOUS) ×2 IMPLANT
COTTON BALL STRL MEDIUM (GAUZE/BANDAGES/DRESSINGS) ×2 IMPLANT
GLOVE BIO SURGEON STRL SZ7.5 (GLOVE) ×4 IMPLANT
KIT TURNOVER KIT A (KITS) ×2 IMPLANT
MARKER SKIN W/RULER 31145785 (MISCELLANEOUS) ×2 IMPLANT
NEEDLE HYPO 25GX1X1/2 BEV (NEEDLE) ×2 IMPLANT
NS IRRIG 500ML POUR BTL (IV SOLUTION) ×2 IMPLANT
PACK ENT CUSTOM (PACKS) ×2 IMPLANT
SOL PREP PVP 2OZ (MISCELLANEOUS) ×2
SOLUTION PREP PVP 2OZ (MISCELLANEOUS) ×1 IMPLANT
STRAP BODY AND KNEE 60X3 (MISCELLANEOUS) ×2 IMPLANT
SUCTION FRAZIER TIP 10 FR DISP (SUCTIONS) IMPLANT
SUT PROLENE 5 0 P 3 (SUTURE) ×2 IMPLANT
SUT PROLENE 6 0 P 1 18 (SUTURE) ×2 IMPLANT
SUT VIC AB 5-0 P-3 18X BRD (SUTURE) ×1 IMPLANT
SUT VIC AB 5-0 P3 18 (SUTURE) ×1
SYR 10ML LL (SYRINGE) ×2 IMPLANT

## 2018-01-18 NOTE — Discharge Instructions (Signed)
General Anesthesia, Adult, Care After °These instructions provide you with information about caring for yourself after your procedure. Your health care provider may also give you more specific instructions. Your treatment has been planned according to current medical practices, but problems sometimes occur. Call your health care provider if you have any problems or questions after your procedure. °What can I expect after the procedure? °After the procedure, it is common to have: °· Vomiting. °· A sore throat. °· Mental slowness. ° °It is common to feel: °· Nauseous. °· Cold or shivery. °· Sleepy. °· Tired. °· Sore or achy, even in parts of your body where you did not have surgery. ° °Follow these instructions at home: °For at least 24 hours after the procedure: °· Do not: °? Participate in activities where you could fall or become injured. °? Drive. °? Use heavy machinery. °? Drink alcohol. °? Take sleeping pills or medicines that cause drowsiness. °? Make important decisions or sign legal documents. °? Take care of children on your own. °· Rest. °Eating and drinking °· If you vomit, drink water, juice, or soup when you can drink without vomiting. °· Drink enough fluid to keep your urine clear or pale yellow. °· Make sure you have little or no nausea before eating solid foods. °· Follow the diet recommended by your health care provider. °General instructions °· Have a responsible adult stay with you until you are awake and alert. °· Return to your normal activities as told by your health care provider. Ask your health care provider what activities are safe for you. °· Take over-the-counter and prescription medicines only as told by your health care provider. °· If you smoke, do not smoke without supervision. °· Keep all follow-up visits as told by your health care provider. This is important. °Contact a health care provider if: °· You continue to have nausea or vomiting at home, and medicines are not helpful. °· You  cannot drink fluids or start eating again. °· You cannot urinate after 8-12 hours. °· You develop a skin rash. °· You have fever. °· You have increasing redness at the site of your procedure. °Get help right away if: °· You have difficulty breathing. °· You have chest pain. °· You have unexpected bleeding. °· You feel that you are having a life-threatening or urgent problem. °This information is not intended to replace advice given to you by your health care provider. Make sure you discuss any questions you have with your health care provider. °Document Released: 07/12/2000 Document Revised: 09/08/2015 Document Reviewed: 03/20/2015 °Elsevier Interactive Patient Education © 2018 [employer] ° °

## 2018-01-18 NOTE — Transfer of Care (Signed)
Immediate Anesthesia Transfer of Care Note  Patient: Bridget Deleon  Procedure(s) Performed: EXCISION OF RIGHT EAR LESION (Right Ear)  Patient Location: PACU  Anesthesia Type: General  Level of Consciousness: awake, alert  and patient cooperative  Airway and Oxygen Therapy: Patient Spontanous Breathing and Patient connected to supplemental oxygen  Post-op Assessment: Post-op Vital signs reviewed, Patient's Cardiovascular Status Stable, Respiratory Function Stable, Patent Airway and No signs of Nausea or vomiting  Post-op Vital Signs: Reviewed and stable  Complications: No apparent anesthesia complications

## 2018-01-18 NOTE — H&P (Signed)
..  History and Physical paper copy reviewed and updated date of procedure and will be scanned into system.  Patient seen and examined and marked.  

## 2018-01-18 NOTE — Anesthesia Postprocedure Evaluation (Signed)
Anesthesia Post Note  Patient: Bridget Deleon  Procedure(s) Performed: EXCISION OF RIGHT EAR LESION (Right Ear)  Patient location during evaluation: PACU Anesthesia Type: General Level of consciousness: awake and alert, oriented and patient cooperative Pain management: pain level controlled Vital Signs Assessment: post-procedure vital signs reviewed and stable Respiratory status: spontaneous breathing, nonlabored ventilation and respiratory function stable Cardiovascular status: blood pressure returned to baseline and stable Postop Assessment: adequate PO intake Anesthetic complications: no    Darrin Nipper

## 2018-01-18 NOTE — Op Note (Signed)
..  01/18/2018  10:20 AM    Knecht, Enrique Sack  [id number]   Pre-Op Dx:  ULCER NEOPLASM UNCERTAIN BEHAVIOR EAR  Post-op Dx: ULCER NEOPLASM UNCERTAIN BEHAVIOR EAR  Proc:Excision of right ear mass with advancement flap closure 2cm  Surg: Vernelle Wisner  Anes:  IV sedation  EBL:  None  Comp:  None  Findings:  Ulcerated mass extending from antihelix to conchal bowl excised.  Cartilage margin excised beneath.  Diffuse erythema surrounding lesion.  Procedure: With the patient in a comfortable supine position, IV sedation anesthesia was administered.  At an appropriate level, right ear was injected with 22ml of 1% lidocaine with 1:100,000 epinephrine.  The patient was prepped and draped in a sterile fashion.  Using a 15 blade, a fusiform incision was made surrounding the nodular and ulcerated lesion.  Dissection with 15 blade and scissors was made under magnification down to the perichondrium.  The cartilage did not appear to be invovled.  The lesion was passed off the table for permanent evaluation.  Skin was elevated along the side of the conchal bowl and antihelix for closure.  There was limited mobility so a notch of cartilage was removed from underneath the lesion to allow for closure.  Deep 5.0 vicryl and superifical interupted 5.0 Prolene was used for closure of the skin.  The lesion was topped with a bacitracin soaked cotton ball.  Following this  The patient was returned to anesthesia, awakened, and transferred to recovery in stable condition.  Dispo:  PACU to home  Plan: Topical antibiotic use with cotton ball.  Follow up in 1 week.   Curly Mackowski 10:20 AM 01/18/2018

## 2018-01-18 NOTE — Anesthesia Procedure Notes (Signed)
Performed by: Jarnell Cordaro, CRNA Pre-anesthesia Checklist: Patient identified, Emergency Drugs available, Suction available, Patient being monitored and Timeout performed Patient Re-evaluated:Patient Re-evaluated prior to induction Oxygen Delivery Method: Simple face mask Placement Confirmation: positive ETCO2 and breath sounds checked- equal and bilateral       

## 2018-01-19 ENCOUNTER — Encounter: Payer: Self-pay | Admitting: Otolaryngology

## 2018-01-23 ENCOUNTER — Other Ambulatory Visit: Payer: Self-pay | Admitting: Internal Medicine

## 2018-01-23 DIAGNOSIS — Z1231 Encounter for screening mammogram for malignant neoplasm of breast: Secondary | ICD-10-CM

## 2018-01-23 LAB — SURGICAL PATHOLOGY

## 2018-02-03 ENCOUNTER — Ambulatory Visit
Admission: RE | Admit: 2018-02-03 | Discharge: 2018-02-03 | Disposition: A | Discharge status: Home / Self Care | Payer: Medicare Other | Source: Ambulatory Visit | Attending: Internal Medicine | Admitting: Internal Medicine

## 2018-02-03 DIAGNOSIS — Z1231 Encounter for screening mammogram for malignant neoplasm of breast: Secondary | ICD-10-CM | POA: Insufficient documentation

## 2018-10-13 DIAGNOSIS — L509 Urticaria, unspecified: Secondary | ICD-10-CM | POA: Insufficient documentation

## 2019-05-22 IMAGING — MG MM DIGITAL SCREENING BILAT W/ TOMO W/ CAD
6 of 12 series · 6 of 36 positions shown · non-contrast
Comparison: Previous exam(s).

CLINICAL DATA: Screening.

EXAM:
DIGITAL SCREENING BILATERAL MAMMOGRAM WITH TOMO AND CAD

[R CC synth-2D (1 of 2)]
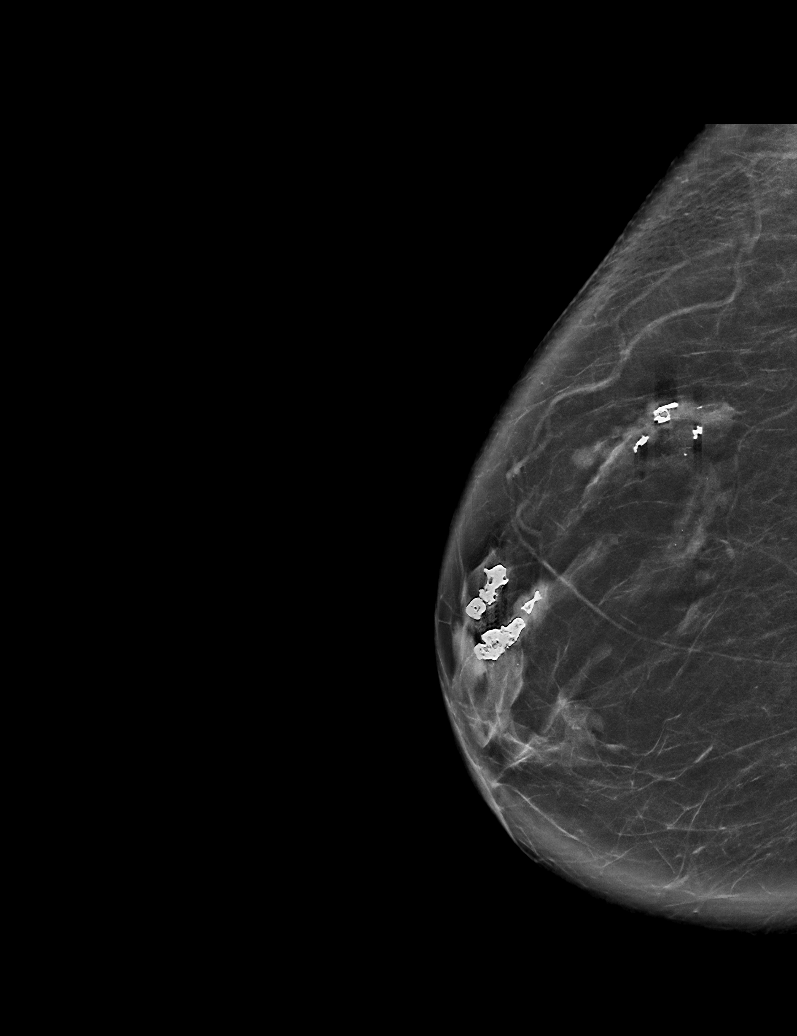

[R CC synth-2D (2 of 2)]
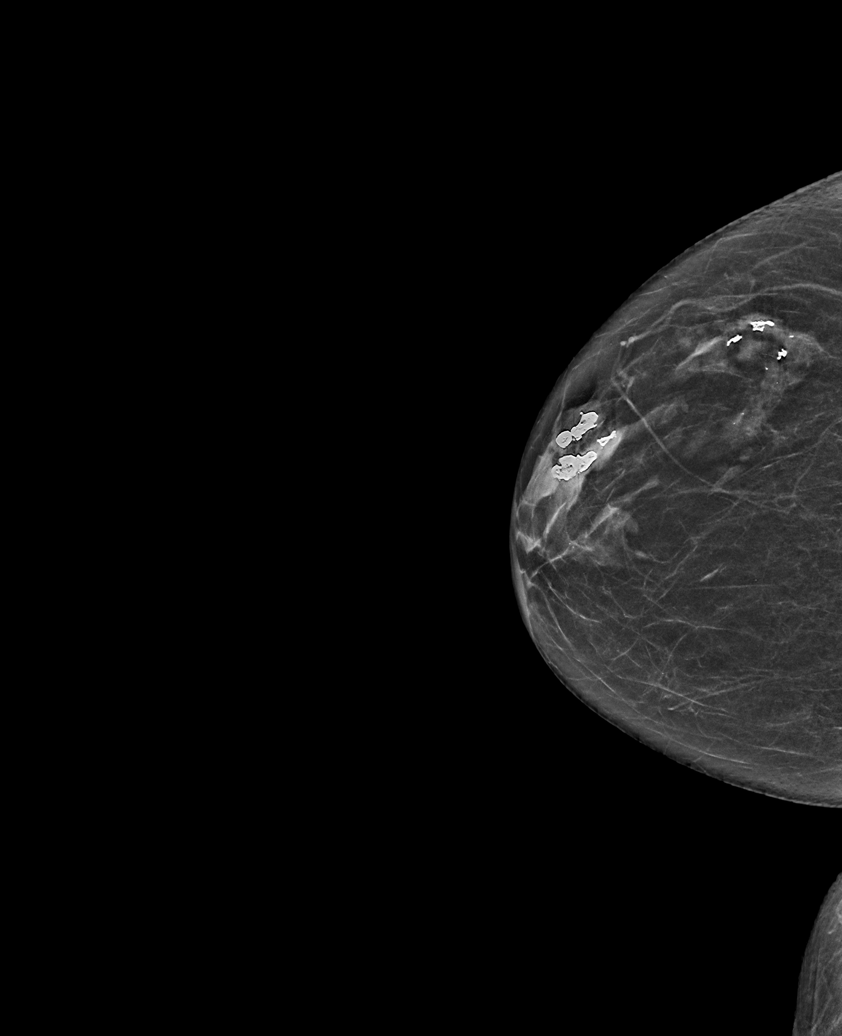

[R MLO synth-2D]
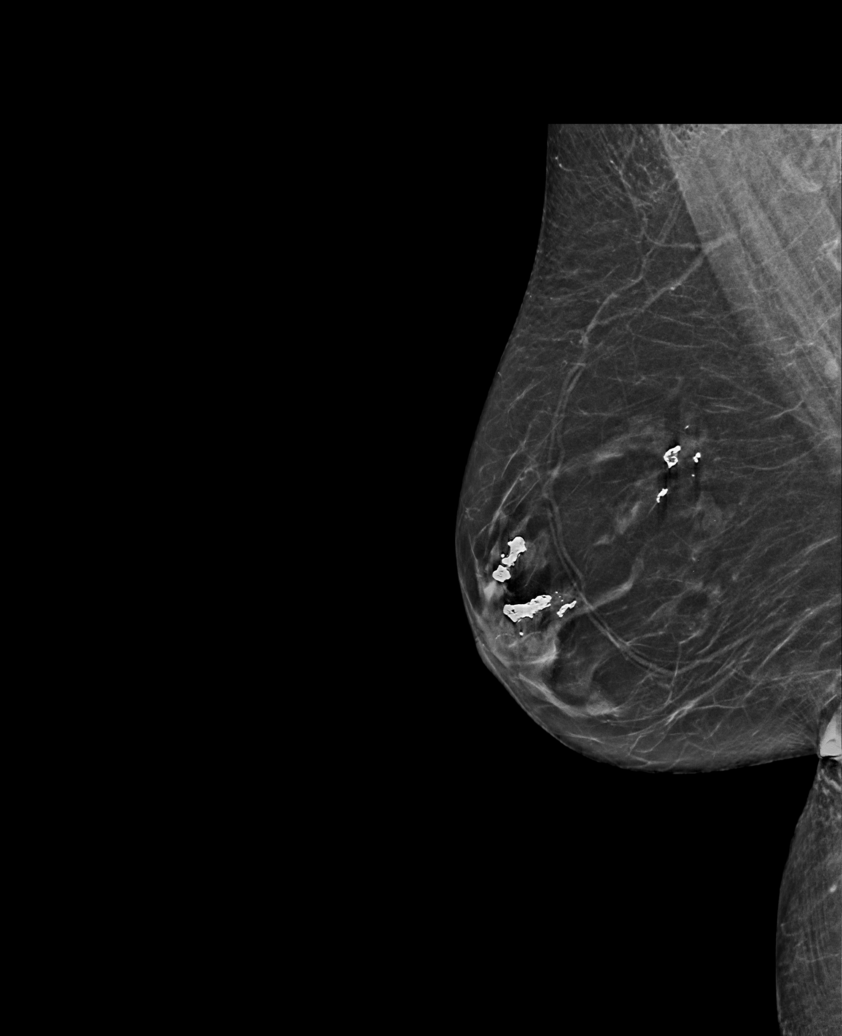

[L MLO synth-2D]
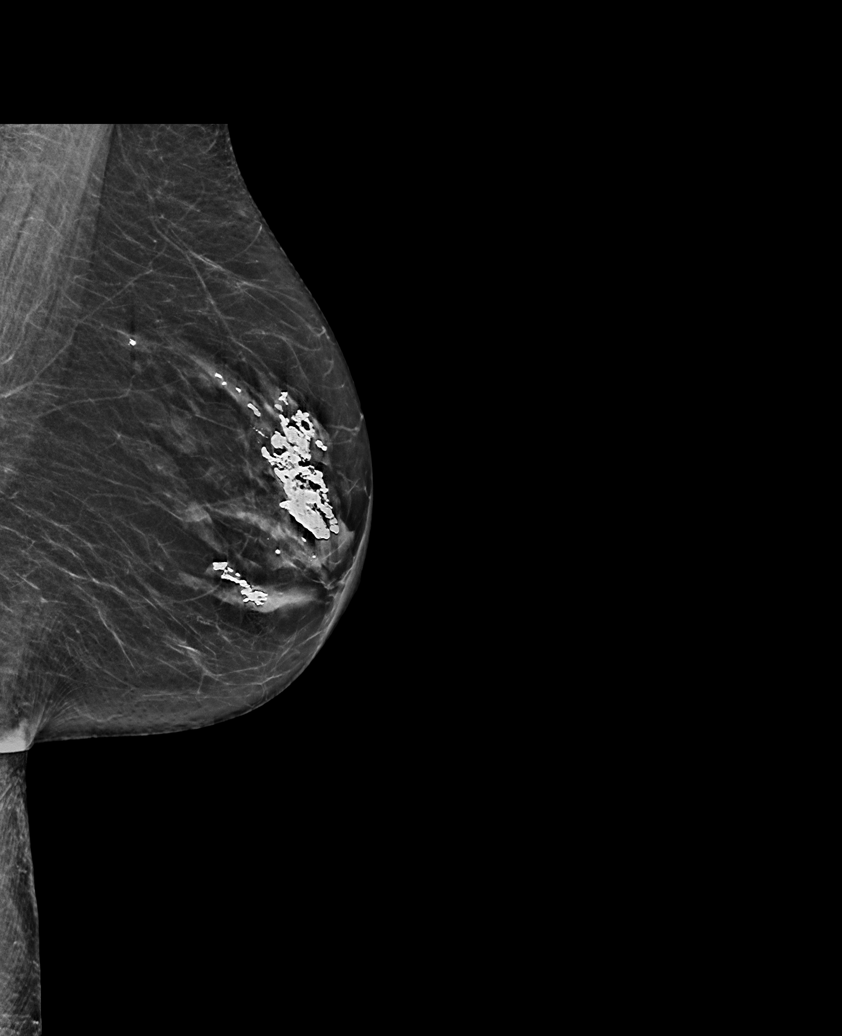

[L CC synth-2D (1 of 2)]
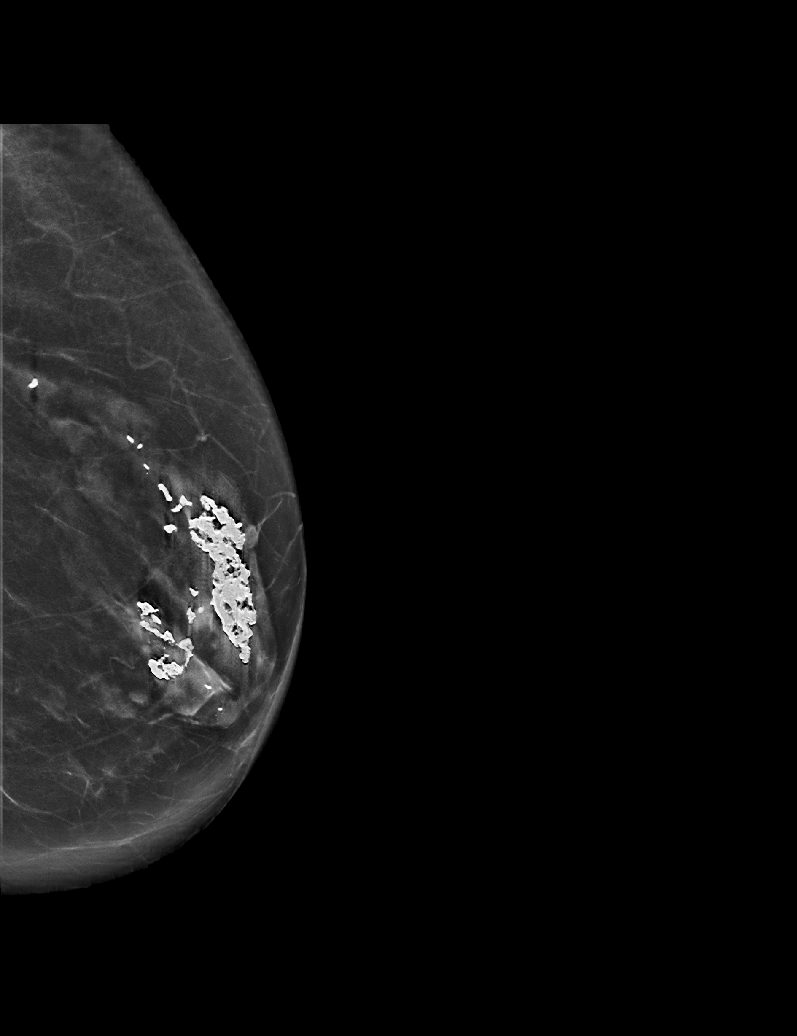

[L CC synth-2D (2 of 2)]
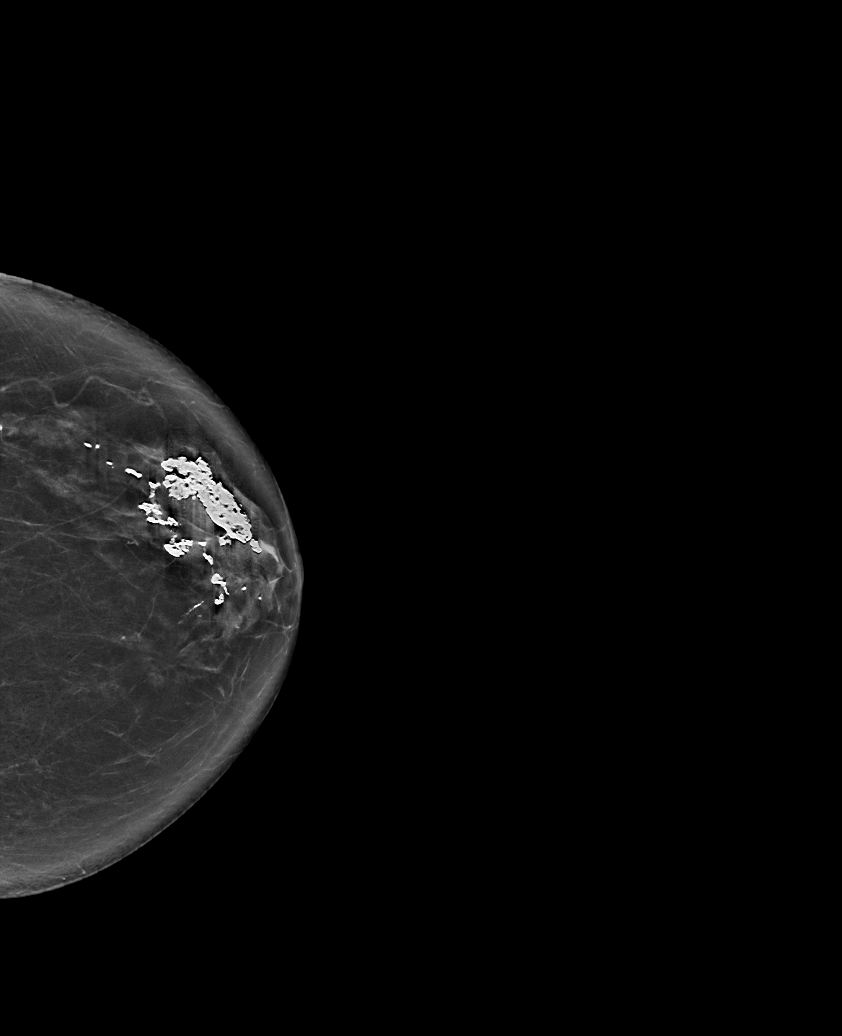

[6 of 36 positions shown; findings below may reference images not displayed]

ACR Breast Density Category b: There are scattered areas of
fibroglandular density.
FINDINGS: There are no findings suspicious for malignancy. Images were
processed with CAD.
IMPRESSION: No mammographic evidence of malignancy. A result letter of this
screening mammogram will be mailed directly to the patient.

RECOMMENDATION:
Screening mammogram in one year. (Code:IV-4-JSM)

BI-RADS CATEGORY  2: Benign.

## 2019-08-24 DIAGNOSIS — Z85118 Personal history of other malignant neoplasm of bronchus and lung: Secondary | ICD-10-CM | POA: Insufficient documentation

## 2019-10-15 ENCOUNTER — Other Ambulatory Visit: Payer: Self-pay | Admitting: Internal Medicine

## 2019-10-15 DIAGNOSIS — Z1231 Encounter for screening mammogram for malignant neoplasm of breast: Secondary | ICD-10-CM

## 2020-01-29 ENCOUNTER — Ambulatory Visit
Admission: RE | Admit: 2020-01-29 | Discharge: 2020-01-29 | Disposition: A | Discharge status: Home / Self Care | Payer: Medicare PPO | Source: Ambulatory Visit | Attending: Internal Medicine | Admitting: Internal Medicine

## 2020-01-29 ENCOUNTER — Other Ambulatory Visit: Payer: Self-pay

## 2020-01-29 DIAGNOSIS — Z1231 Encounter for screening mammogram for malignant neoplasm of breast: Secondary | ICD-10-CM | POA: Diagnosis present

## 2020-06-18 DIAGNOSIS — E782 Mixed hyperlipidemia: Secondary | ICD-10-CM | POA: Insufficient documentation

## 2020-06-18 DIAGNOSIS — I10 Essential (primary) hypertension: Secondary | ICD-10-CM | POA: Insufficient documentation

## 2020-06-18 DIAGNOSIS — R9431 Abnormal electrocardiogram [ECG] [EKG]: Secondary | ICD-10-CM | POA: Insufficient documentation

## 2020-07-03 ENCOUNTER — Ambulatory Visit: Payer: Medicare PPO | Admitting: Dermatology

## 2020-08-18 DIAGNOSIS — R0602 Shortness of breath: Secondary | ICD-10-CM | POA: Insufficient documentation

## 2020-08-27 ENCOUNTER — Other Ambulatory Visit: Payer: Self-pay

## 2020-08-27 ENCOUNTER — Ambulatory Visit: Payer: Medicare PPO | Admitting: Dermatology

## 2020-08-27 DIAGNOSIS — L578 Other skin changes due to chronic exposure to nonionizing radiation: Secondary | ICD-10-CM | POA: Diagnosis not present

## 2020-08-27 DIAGNOSIS — L820 Inflamed seborrheic keratosis: Secondary | ICD-10-CM | POA: Diagnosis not present

## 2020-08-27 DIAGNOSIS — L821 Other seborrheic keratosis: Secondary | ICD-10-CM

## 2020-08-27 NOTE — Patient Instructions (Signed)

## 2020-08-27 NOTE — Progress Notes (Signed)
   New Patient Visit  Subjective  Bridget Deleon is a 80 y.o. female who presents for the following: check spot (R infra ocular, ~20yrs, growing, itchy).  She has other areas to be evaluated. New patient referral from Dr. Tracie Harrier. Patient accompanied by sister who contributes to history.  The following portions of the chart were reviewed this encounter and updated as appropriate:   Tobacco  Allergies  Meds  Problems  Med Hx  Surg Hx  Fam Hx     Review of Systems:  No other skin or systemic complaints except as noted in HPI or Assessment and Plan.  Objective  Well appearing patient in no apparent distress; mood and affect are within normal limits.  A focused examination was performed including face. Relevant physical exam findings are noted in the Assessment and Plan.  Objective  R med lower eyelid and eylid margin x 1: Erythematous keratotic or waxy stuck-on papule or plaque.    Assessment & Plan  Inflamed seborrheic keratosis R med lower eyelid and eylid margin x 1  Destruction of lesion - R med lower eyelid and eylid margin x 1 Complexity: simple   Destruction method: cryotherapy   Informed consent: discussed and consent obtained   Timeout:  patient name, date of birth, surgical site, and procedure verified Lesion destroyed using liquid nitrogen: Yes   Region frozen until ice ball extended beyond lesion: Yes   Outcome: patient tolerated procedure well with no complications   Post-procedure details: wound care instructions given    Actinic Damage - chronic, secondary to cumulative UV radiation exposure/sun exposure over time - diffuse scaly erythematous macules with underlying dyspigmentation - Recommend daily broad spectrum sunscreen SPF 30+ to sun-exposed areas, reapply every 2 hours as needed.  - Recommend staying in the shade or wearing long sleeves, sun glasses (UVA+UVB protection) and wide brim hats (4-inch brim around the entire circumference of the  hat). - Call for new or changing lesions.  Seborrheic Keratoses - Stuck-on, waxy, tan-brown papules and/or plaques  - Benign-appearing - Discussed benign etiology and prognosis. - Observe - Call for any changes  Return in about 3 months (around 11/27/2020) for recheck ISK.  I, Othelia Pulling, RMA, am acting as scribe for Sarina Ser, MD .  Documentation: I have reviewed the above documentation for accuracy and completeness, and I agree with the above.  Sarina Ser, MD

## 2020-09-02 ENCOUNTER — Encounter: Payer: Self-pay | Admitting: Dermatology

## 2020-09-05 ENCOUNTER — Other Ambulatory Visit: Payer: Self-pay | Admitting: Internal Medicine

## 2020-09-05 DIAGNOSIS — Z1231 Encounter for screening mammogram for malignant neoplasm of breast: Secondary | ICD-10-CM

## 2020-12-16 ENCOUNTER — Other Ambulatory Visit: Payer: Self-pay

## 2020-12-16 ENCOUNTER — Encounter: Payer: Self-pay | Admitting: Dermatology

## 2020-12-16 ENCOUNTER — Ambulatory Visit: Payer: Medicare PPO | Admitting: Dermatology

## 2020-12-16 DIAGNOSIS — L820 Inflamed seborrheic keratosis: Secondary | ICD-10-CM

## 2020-12-16 DIAGNOSIS — Z872 Personal history of diseases of the skin and subcutaneous tissue: Secondary | ICD-10-CM | POA: Diagnosis not present

## 2020-12-16 DIAGNOSIS — L821 Other seborrheic keratosis: Secondary | ICD-10-CM | POA: Diagnosis not present

## 2020-12-16 NOTE — Patient Instructions (Signed)

## 2020-12-16 NOTE — Progress Notes (Signed)
   Follow-Up Visit   Subjective  Bridget Deleon is a 80 y.o. female who presents for the following: recheck ISK (R med lower eyelid and eylid margin - resolved per patient. ).  The following portions of the chart were reviewed this encounter and updated as appropriate:   Tobacco  Allergies  Meds  Problems  Med Hx  Surg Hx  Fam Hx     Review of Systems:  No other skin or systemic complaints except as noted in HPI or Assessment and Plan.  Objective  Well appearing patient in no apparent distress; mood and affect are within normal limits.  A focused examination was performed including face. Relevant physical exam findings are noted in the Assessment and Plan.  Right med lower eyelid and eyelid margin Clear today   Assessment & Plan  Inflamed seborrheic keratosis Right med lower eyelid and eyelid margin Hx of ISK, resolved with LN2  Seborrheic Keratoses - Stuck-on, waxy, tan-brown papules and/or plaques  - Benign-appearing - Discussed benign etiology and prognosis. - Observe - Call for any changes  Return if symptoms worsen or fail to improve.  I, Othelia Pulling, RMA, am acting as scribe for Sarina Ser, MD . Documentation: I have reviewed the above documentation for accuracy and completeness, and I agree with the above.  Sarina Ser, MD

## 2021-02-24 ENCOUNTER — Other Ambulatory Visit: Payer: Self-pay

## 2021-02-24 ENCOUNTER — Ambulatory Visit
Admission: RE | Admit: 2021-02-24 | Discharge: 2021-02-24 | Disposition: A | Discharge status: Home / Self Care | Payer: Medicare PPO | Source: Ambulatory Visit | Attending: Internal Medicine | Admitting: Internal Medicine

## 2021-02-24 DIAGNOSIS — Z1231 Encounter for screening mammogram for malignant neoplasm of breast: Secondary | ICD-10-CM | POA: Insufficient documentation

## 2021-11-11 DIAGNOSIS — R413 Other amnesia: Secondary | ICD-10-CM | POA: Insufficient documentation

## 2021-11-11 DIAGNOSIS — F411 Generalized anxiety disorder: Secondary | ICD-10-CM | POA: Insufficient documentation

## 2021-11-30 ENCOUNTER — Other Ambulatory Visit
Admission: RE | Admit: 2021-11-30 | Discharge: 2021-11-30 | Disposition: A | Discharge status: Home / Self Care | Payer: Medicare PPO | Source: Ambulatory Visit | Attending: Pulmonary Disease | Admitting: Pulmonary Disease

## 2021-11-30 DIAGNOSIS — R0609 Other forms of dyspnea: Secondary | ICD-10-CM | POA: Insufficient documentation

## 2021-11-30 DIAGNOSIS — Z85118 Personal history of other malignant neoplasm of bronchus and lung: Secondary | ICD-10-CM | POA: Diagnosis present

## 2021-11-30 LAB — D-DIMER, QUANTITATIVE: D-Dimer, Quant: 0.54 ug/mL-FEU — ABNORMAL HIGH (ref 0.00–0.50)

## 2021-12-02 ENCOUNTER — Other Ambulatory Visit: Payer: Self-pay | Admitting: Pulmonary Disease

## 2021-12-02 ENCOUNTER — Other Ambulatory Visit (HOSPITAL_COMMUNITY): Payer: Self-pay | Admitting: Pulmonary Disease

## 2021-12-02 DIAGNOSIS — R0609 Other forms of dyspnea: Secondary | ICD-10-CM

## 2021-12-08 ENCOUNTER — Other Ambulatory Visit: Payer: Medicare PPO

## 2021-12-09 ENCOUNTER — Ambulatory Visit
Admission: RE | Admit: 2021-12-09 | Discharge: 2021-12-09 | Disposition: A | Discharge status: Home / Self Care | Payer: Medicare PPO | Source: Ambulatory Visit | Attending: Pulmonary Disease | Admitting: Pulmonary Disease

## 2021-12-09 DIAGNOSIS — R0609 Other forms of dyspnea: Secondary | ICD-10-CM | POA: Diagnosis present

## 2021-12-09 MED ORDER — IOHEXOL 350 MG/ML SOLN
75.0000 mL | Freq: Once | INTRAVENOUS | Status: AC | PRN
  Administered 2021-12-09: 75 mL via INTRAVENOUS

## 2022-01-25 ENCOUNTER — Other Ambulatory Visit: Payer: Self-pay | Admitting: Internal Medicine

## 2022-01-25 DIAGNOSIS — Z1231 Encounter for screening mammogram for malignant neoplasm of breast: Secondary | ICD-10-CM

## 2022-02-25 ENCOUNTER — Ambulatory Visit
Admission: RE | Admit: 2022-02-25 | Discharge: 2022-02-25 | Disposition: A | Discharge status: Home / Self Care | Payer: Medicare PPO | Source: Ambulatory Visit | Attending: Internal Medicine | Admitting: Internal Medicine

## 2022-02-25 DIAGNOSIS — Z1231 Encounter for screening mammogram for malignant neoplasm of breast: Secondary | ICD-10-CM | POA: Diagnosis present

## 2022-04-19 ENCOUNTER — Observation Stay
Admission: EM | Admit: 2022-04-19 | Discharge: 2022-04-21 | Disposition: A | Discharge status: Home / Self Care | Payer: Medicare PPO | Attending: Student | Admitting: Student

## 2022-04-19 ENCOUNTER — Emergency Department: Payer: Medicare PPO

## 2022-04-19 DIAGNOSIS — Z79899 Other long term (current) drug therapy: Secondary | ICD-10-CM | POA: Diagnosis not present

## 2022-04-19 DIAGNOSIS — Z87891 Personal history of nicotine dependence: Secondary | ICD-10-CM | POA: Diagnosis not present

## 2022-04-19 DIAGNOSIS — R0602 Shortness of breath: Secondary | ICD-10-CM | POA: Diagnosis present

## 2022-04-19 DIAGNOSIS — E876 Hypokalemia: Secondary | ICD-10-CM | POA: Insufficient documentation

## 2022-04-19 DIAGNOSIS — M810 Age-related osteoporosis without current pathological fracture: Secondary | ICD-10-CM | POA: Diagnosis not present

## 2022-04-19 DIAGNOSIS — R0902 Hypoxemia: Secondary | ICD-10-CM

## 2022-04-19 DIAGNOSIS — Z1152 Encounter for screening for COVID-19: Secondary | ICD-10-CM | POA: Diagnosis not present

## 2022-04-19 DIAGNOSIS — J101 Influenza due to other identified influenza virus with other respiratory manifestations: Secondary | ICD-10-CM | POA: Diagnosis not present

## 2022-04-19 DIAGNOSIS — Z85118 Personal history of other malignant neoplasm of bronchus and lung: Secondary | ICD-10-CM | POA: Diagnosis not present

## 2022-04-19 DIAGNOSIS — J441 Chronic obstructive pulmonary disease with (acute) exacerbation: Secondary | ICD-10-CM | POA: Insufficient documentation

## 2022-04-19 DIAGNOSIS — I2510 Atherosclerotic heart disease of native coronary artery without angina pectoris: Secondary | ICD-10-CM | POA: Diagnosis not present

## 2022-04-19 DIAGNOSIS — K589 Irritable bowel syndrome without diarrhea: Secondary | ICD-10-CM | POA: Insufficient documentation

## 2022-04-19 DIAGNOSIS — R791 Abnormal coagulation profile: Secondary | ICD-10-CM | POA: Insufficient documentation

## 2022-04-19 DIAGNOSIS — J9601 Acute respiratory failure with hypoxia: Secondary | ICD-10-CM | POA: Diagnosis not present

## 2022-04-19 LAB — CBC WITH DIFFERENTIAL/PLATELET
Abs Immature Granulocytes: 0.07 10*3/uL (ref 0.00–0.07)
Basophils Absolute: 0.1 10*3/uL (ref 0.0–0.1)
Basophils Relative: 1 %
Eosinophils Absolute: 0.1 10*3/uL (ref 0.0–0.5)
Eosinophils Relative: 1 %
HCT: 39.6 % (ref 36.0–46.0)
Hemoglobin: 13 g/dL (ref 12.0–15.0)
Immature Granulocytes: 1 %
Lymphocytes Relative: 17 %
Lymphs Abs: 2.1 10*3/uL (ref 0.7–4.0)
MCH: 30.7 pg (ref 26.0–34.0)
MCHC: 32.8 g/dL (ref 30.0–36.0)
MCV: 93.4 fL (ref 80.0–100.0)
Monocytes Absolute: 1 10*3/uL (ref 0.1–1.0)
Monocytes Relative: 8 %
Neutro Abs: 8.9 10*3/uL — ABNORMAL HIGH (ref 1.7–7.7)
Neutrophils Relative %: 72 %
Platelets: 221 10*3/uL (ref 150–400)
RBC: 4.24 MIL/uL (ref 3.87–5.11)
RDW: 13.3 % (ref 11.5–15.5)
WBC: 12.1 10*3/uL — ABNORMAL HIGH (ref 4.0–10.5)
nRBC: 0 % (ref 0.0–0.2)

## 2022-04-19 LAB — COMPREHENSIVE METABOLIC PANEL
ALT: 13 U/L (ref 0–44)
AST: 19 U/L (ref 15–41)
Albumin: 3.7 g/dL (ref 3.5–5.0)
Alkaline Phosphatase: 63 U/L (ref 38–126)
Anion gap: 10 (ref 5–15)
BUN: 24 mg/dL — ABNORMAL HIGH (ref 8–23)
CO2: 24 mmol/L (ref 22–32)
Calcium: 9 mg/dL (ref 8.9–10.3)
Chloride: 101 mmol/L (ref 98–111)
Creatinine, Ser: 0.81 mg/dL (ref 0.44–1.00)
GFR, Estimated: >60 mL/min (ref >60)
Glucose, Bld: 130 mg/dL — ABNORMAL HIGH (ref 70–99)
Potassium: 3.4 mmol/L — ABNORMAL LOW (ref 3.5–5.1)
Sodium: 135 mmol/L (ref 135–145)
Total Bilirubin: 0.7 mg/dL (ref 0.3–1.2)
Total Protein: 7 g/dL (ref 6.5–8.1)

## 2022-04-19 LAB — TROPONIN I (HIGH SENSITIVITY)
Troponin I (High Sensitivity): 7 ng/L (ref <18)
Troponin I (High Sensitivity): 7 ng/L (ref <18)

## 2022-04-19 LAB — RESP PANEL BY RT-PCR (RSV, FLU A&B, COVID)  RVPGX2
Influenza A by PCR: POSITIVE — AB
Influenza B by PCR: NEGATIVE
Resp Syncytial Virus by PCR: NEGATIVE
SARS Coronavirus 2 by RT PCR: NEGATIVE

## 2022-04-19 MED ORDER — SENNOSIDES-DOCUSATE SODIUM 8.6-50 MG PO TABS: 2.0000 | ORAL_TABLET | Freq: Every evening | ORAL | Status: DC | PRN

## 2022-04-19 MED ORDER — DICYCLOMINE HCL 20 MG PO TABS: 20.0000 mg | ORAL_TABLET | Freq: Three times a day (TID) | ORAL | Status: DC | PRN

## 2022-04-19 MED ORDER — PREDNISONE 20 MG PO TABS
20.0000 mg | ORAL_TABLET | Freq: Every day | ORAL | Status: DC
  Administered 2022-04-20: 20 mg via ORAL
  Filled 2022-04-19: qty 1

## 2022-04-19 MED ORDER — OSELTAMIVIR PHOSPHATE 75 MG PO CAPS
75.0000 mg | ORAL_CAPSULE | Freq: Once | ORAL | Status: AC
  Administered 2022-04-19: 75 mg via ORAL
  Filled 2022-04-19: qty 1

## 2022-04-19 MED ORDER — IBUPROFEN 400 MG PO TABS: 400.0000 mg | ORAL_TABLET | Freq: Four times a day (QID) | ORAL | Status: DC | PRN

## 2022-04-19 MED ORDER — ONDANSETRON 8 MG PO TBDP
8.0000 mg | ORAL_TABLET | Freq: Three times a day (TID) | ORAL | Status: DC | PRN
  Administered 2022-04-20: 8 mg via ORAL
  Filled 2022-04-19: qty 2

## 2022-04-19 MED ORDER — POTASSIUM CHLORIDE CRYS ER 20 MEQ PO TBCR
20.0000 meq | EXTENDED_RELEASE_TABLET | ORAL | Status: AC
  Administered 2022-04-19 – 2022-04-20 (×3): 20 meq via ORAL
  Filled 2022-04-19 (×3): qty 1

## 2022-04-19 MED ORDER — OXYCODONE-ACETAMINOPHEN 5-325 MG PO TABS: 1.0000 | ORAL_TABLET | Freq: Three times a day (TID) | ORAL | Status: DC | PRN

## 2022-04-19 MED ORDER — OYSTER SHELL CALCIUM/D3 500-5 MG-MCG PO TABS
1.0000 | ORAL_TABLET | Freq: Every morning | ORAL | Status: DC
  Administered 2022-04-20 – 2022-04-21 (×2): 1 via ORAL
  Filled 2022-04-19 (×2): qty 1

## 2022-04-19 MED ORDER — PREDNISOLONE 5 MG PO TABS: 5.0000 mg | ORAL_TABLET | Freq: Every day | ORAL | Status: DC

## 2022-04-19 MED ORDER — GUAIFENESIN ER 600 MG PO TB12
600.0000 mg | ORAL_TABLET | Freq: Two times a day (BID) | ORAL | Status: DC
  Administered 2022-04-19: 600 mg via ORAL
  Filled 2022-04-19: qty 1

## 2022-04-19 MED ORDER — BENZONATATE 100 MG PO CAPS
200.0000 mg | ORAL_CAPSULE | Freq: Three times a day (TID) | ORAL | Status: DC | PRN
  Administered 2022-04-20 – 2022-04-21 (×3): 200 mg via ORAL
  Filled 2022-04-19 (×3): qty 2

## 2022-04-19 NOTE — ED Triage Notes (Signed)
Pt presents to the ED via POV due to SOB and increasing tiredness x 2 weeks. Pt states she took a z pack that was prescribed by pcp. Pt is talkative in triage. Pt A&Ox4

## 2022-04-19 NOTE — H&P (Signed)
History and Physical    Patient: Bridget Deleon WOE:[id number] DOB: 1940/11/03 DOA: 04/19/2022 DOS: the patient was seen and examined on 04/19/2022 PCP: Tracie Harrier, MD  Patient coming from: Home  Chief Complaint:  Chief Complaint  Patient presents with   Shortness of Breath   HPI: Bridget Deleon is a 82 y.o. 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  She presented from home with a 3-day history of cough and shortness of breath.  She denies any known sick contacts.  Cough is nonproductive.  No associated fever or chills.  She also admits to generalized weakness and difficulty with walking.  She has had poor oral intake.  She was also noted to have shortness of breath and hypoxia on presentation.  Workup in the ED revealed influenza A infection.  Chest x-ray however unremarkable for any acute infiltrates.  Due to patient's age, pulmonary comorbidities and need for oxygen, patient is being admitted to hospitalist service for further management.  Patient was started on oseltamavir in the ER.  Review of Systems: As mentioned in the history of present illness. All other systems reviewed and are negative. Past Medical History:  Diagnosis Date   Arthritis    osteoarthritis   Cancer ([place]) 2010   right lower lobe cancer    Complication of anesthesia 23 yrs ago .    woke up on operating table, also A-fib for short time after Lung surgery   Coronary artery disease 2010   woke up and states she was told  bottom of heart was asleep ... takes diltiazem for that reason .   GERD (gastroesophageal reflux disease)    Ovarian ca [facility]) 1972   Wears dentures    full upper and lower   Past Surgical History:  Procedure Laterality Date   ABDOMINAL HYSTERECTOMY     LUMBAR LAMINECTOMY/DECOMPRESSION MICRODISCECTOMY  01/06/2012    Procedure: LUMBAR LAMINECTOMY/DECOMPRESSION MICRODISCECTOMY 1 LEVEL;  Surgeon: Ophelia Charter, MD;  Location: [facility];  Service: Neurosurgery;  Laterality: Right;  RIGHT Lumbar four - five diskectomy   LUNG LOBECTOMY  2009   MASS EXCISION Right 01/18/2018   Procedure: EXCISION OF RIGHT EAR LESION;  Surgeon: Carloyn Manner, MD;  Location: [place];  Service: ENT;  Laterality: Right;   TONSILLECTOMY     Social History:  reports that she quit smoking about 15 years ago. Her smoking use included cigarettes. She has never used smokeless tobacco. She reports current drug use. Drug: Oxycodone. She reports that she does not drink alcohol.  Allergies  Allergen Reactions   Iron Rash   Penicillins Rash   Adhesive [Tape]     Tears skin   Calamine     Burns skin   Codeine Nausea And Vomiting   Vicodin [Hydrocodone-Acetaminophen] Nausea And Vomiting    Family History  Problem Relation Age of Onset   Breast cancer Neg Hx     Prior to Admission medications   Medication Sig Start Date End Date Taking? Authorizing Provider  alendronate (FOSAMAX) 70 MG tablet Take 70 mg by mouth every 7 (seven) days. On Saturday.  Take with a full glass of water on an empty stomach.    [provider]  benzonatate (TESSALON) 200 MG capsule Take 200 mg by mouth 3 (three) times daily as needed for cough.    [provider]  Calcium Carbonate-Vitamin D (  CALCIUM-D) 600-400 MG-UNIT TABS Take 1 tablet by mouth every morning.    [provider]  cholecalciferol (VITAMIN D) 1000 UNITS tablet Take 1,000 Units by mouth every morning.    [provider]  CRANBERRY PO Take 1,500 mg by mouth every morning.    [provider]  dicyclomine (BENTYL) 20 MG tablet Take 20 mg by mouth 3 (three) times daily as needed for spasms.    [provider]  diphenhydramine-acetaminophen (TYLENOL PM) 25-500 MG TABS tablet Take 1 tablet by mouth at bedtime as needed.     [provider]  gentamicin cream (GARAMYCIN) 0.1 % Apply 1 application topically 3 (three) times daily as needed.    [provider]  gentamicin ointment (GARAMYCIN) 0.1 % Apply 1 application topically 3 (three) times daily. 01/18/18   Vaught, Jeannie Fend, MD  hydrocortisone 2.5 % cream Apply topically 2 (two) times daily as needed.    [provider]  ibuprofen (ADVIL,MOTRIN) 800 MG tablet Take 800 mg by mouth 2 (two) times daily as needed. For pain    [provider]  losartan (COZAAR) 100 MG tablet Take by mouth. 08/18/20 08/18/21  [provider]  methocarbamol (ROBAXIN) 500 MG tablet Take 500 mg by mouth 3 (three) times daily as needed for muscle spasms.    [provider]  ondansetron (ZOFRAN-ODT) 8 MG disintegrating tablet Take 8 mg by mouth every 8 (eight) hours as needed for nausea or vomiting.    [provider]  oxyCODONE-acetaminophen (PERCOCET/ROXICET) 5-325 MG tablet Take by mouth every 4 (four) hours as needed for severe pain.    [provider]  prednisoLONE 5 MG TABS tablet Take 5 mg by mouth daily.    [provider]  senna-docusate (SENOKOT-S) 8.6-50 MG per tablet Take 2 tablets by mouth at bedtime as needed. For constipation    [provider]  traMADol (ULTRAM) 50 MG tablet Take by mouth every 6 (six) hours as needed.    [provider]  triamcinolone cream (KENALOG) 0.1 % Apply 1 application topically 2 (two) times daily as needed.    [provider]    Physical Exam: Vitals:   04/19/22 1623 04/19/22 1625 04/19/22 1631  BP: 118/61    Pulse: 86    Resp: 18    Temp: 98.6 F (37 C)    TempSrc: Oral    SpO2: (!) 89% 91% 93%  Weight:  61.2 kg   Height:  '5\' 9"'$  (1.753 m)    Patient is a pleasant elderly female.  She is alert oriented x 3. H EENT: Pupils are equal round react light accommodation.  No JVD. Neck supple with no JVD. Chest: Clinically diminished bilaterally.   No significant expiratory wheezes appreciated. Abdomen: Soft nontender.  Bowel sounds present.  No organomegaly appreciated. Extremities: Without any pedal edema. Cardiovascular: S1-S2 regular rhythm and rate. CNS: Shows no focal deficits. Skin negative for any new rash.  Data Reviewed: Sodium was 135, potassium 3.4, chloride 101, bicarb 24, glucose 130, BUN 24, creatinine 0.8, WBC 12.1 hemoglobin 13, hematocrit 39, platelet count 221 neutrophil count 72%.  Influenza A+.  Chest x-ray: Shows no acute cardiopulmonary disease.  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  Symptoms consistent with bronchitis versus COPD exacerbation.  Tested positive for Fawze A.  Influenza A viral infection prodrome symptoms plan suspected viral  bronchitis.  Patient was initiated on oseltamavir.  Patient will be gently hydrated.  Advance diet as tolerated by patient.  Guaifenesin, benzonatate will be prescribed.  Acute hypoxia secondary to COPD exacerbation most likely secondary to influenza A infection.  Patient will be given as needed bronchodilators.  Patient is on inhaled corticosteroids at home.  Currently requiring oxygen supplementation.  Mild elevated D-dimer: Using age-adjusted D-dimer measures, patient is less likely to have fatigue based on her numbers.  Medication for CT angiogram at this time.  Last CTA was in August 2023.  Hypokalemia: Potassium replacement protocol will be instituted.  Chronic back pain: Patient will continue home medications with opiates.  She is also on NSAIDs.  Irritable bowel syndrome: Patient is on Bentyl as needed twice daily.  Hypertension: Patient is on Cozaar and nifedipine: Will resume home medications as appropriate and as tolerated by blood pressure.  Osteoporosis: Patient is on Fosamax  Acute on  chronic debility: Physical therapy to evaluate patient in AM.   Advance Care Planning:   Code Status: Full Code   Consults: none  Family Communication: Patient's family who is at bedside backdated regarding plan of care.  Severity of Illness: The appropriate patient status for this patient is OBSERVATION. Observation status is judged to be reasonable and necessary in order to provide the required intensity of service to ensure the patient's safety. The patient's presenting symptoms, physical exam findings, and initial radiographic and laboratory data in the context of their medical condition is felt to place them at decreased risk for further clinical deterioration. Furthermore, it is anticipated that the patient will be medically stable for discharge from the hospital within 2 midnights of admission.   Author: Artist Beach, MD 04/19/2022 7:00 PM  For on call review [email or web address].

## 2022-04-19 NOTE — ED Provider Notes (Signed)
[facility] Provider Note    Event Date/Time   First MD Initiated Contact with Patient 04/19/22 1730     (approximate)  History   Chief Complaint: Shortness of Breath  HPI  Bridget Deleon is a 82 y.o. female with a past medical history arthritis, gastric reflux, presents to the emergency department for generalized weakness cough and shortness of breath.  According to the patient for the past 3 days she has been very weak coughing and short of breath.  Patient states she does not normally use a walker but has been having to use a walker due to diffuse weakness.  No known fever at home.  Vital signs show afebrile in the emergency department however 89% on room air.  No baseline O2 requirement.  Physical Exam   Triage Vital Signs: ED Triage Vitals  Enc Vitals Group     BP 04/19/22 1623 118/61     Pulse Rate 04/19/22 1623 86     Resp 04/19/22 1623 18     Temp 04/19/22 1623 98.6 F (37 C)     Temp Source 04/19/22 1623 Oral     SpO2 04/19/22 1623 (!) 89 %     Weight 04/19/22 1625 135 lb (61.2 kg)     Height 04/19/22 1625 '5\' 9"'$  (1.753 m)     Head Circumference --      Peak Flow --      Pain Score 04/19/22 1625 4     Pain Loc --      Pain Edu? --      Excl. in [place]? --     Most recent vital signs: Vitals:   04/19/22 1625 04/19/22 1631  BP:    Pulse:    Resp:    Temp:    SpO2: 91% 93%    General: Awake, no distress.  Frequent wet sounding cough. CV:  Good peripheral perfusion.  Regular rate and rhythm  Resp:  Normal effort.  Equal breath sounds bilaterally.  Abd:  No distention.  Soft, nontender.  No rebound or guarding.  ED Results / Procedures / Treatments   EKG  EKG viewed and interpreted by myself shows a sinus rhythm at 71 bpm with a narrow QRS, normal axis, normal intervals, nonspecific ST changes.  RADIOLOGY  Chest x-ray viewed and interpreted by myself shows no consolidation. Radiology is read the chest x-ray as  negative.   MEDICATIONS ORDERED IN ED: Medications - No data to display   IMPRESSION / MDM / [place] / ED COURSE  I reviewed the triage vital signs and the nursing notes.  Patient's presentation is most consistent with acute presentation with potential threat to life or bodily function.  Patient presents emergency department for worsening weakness shortness of breath cough found to be hypoxic in the 80s on room air placed on 2 L nasal cannula satting in the mid 90s currently.  No baseline O2 requirement.  Patient's labs have resulted showing overall reassuring CBC with slight leukocytosis of 12,000, chemistry shows no concerning findings troponin reassuringly negative.  Patient's chest x-ray is clear.  Patient's influenza A test is positive highly suspect influenza to be the cause of the patient's weakness shortness of breath cough and hypoxia.  We will maintain the patient on 2 L nasal cannula we will IV hydrate and dose Tamiflu.  Patient will require admission to the hospital service given her new hypoxia.   CRITICAL CARE Performed by: Harvest Dark   Total critical care time:  30 minutes  Critical care time was exclusive of separately billable procedures and treating other patients.  Critical care was necessary to treat or prevent imminent or life-threatening deterioration.  Critical care was time spent personally by me on the following activities: development of treatment plan with patient and/or surrogate as well as nursing, discussions with consultants, evaluation of patient's response to treatment, examination of patient, obtaining history from patient or surrogate, ordering and performing treatments and interventions, ordering and review of laboratory studies, ordering and review of radiographic studies, pulse oximetry and re-evaluation of patient's condition.   FINAL CLINICAL IMPRESSION(S) / ED DIAGNOSES   Respiratory failure secondary to influenza   hypoxia Influenza A    Note:  This document was prepared using Dragon voice recognition software and may include unintentional dictation errors.   Harvest Dark, MD 04/19/22 1827

## 2022-04-19 NOTE — ED Notes (Signed)
Patient provided with [place] box at this time per request of wanting something to eat. Tray set up for easy assistance. Patient tolerating well.

## 2022-04-19 NOTE — ED Provider Triage Note (Signed)
Emergency Medicine Provider Triage Evaluation Note  Bridget Deleon, a 82 y.o. female  was evaluated in triage.  Pt complains of shortness of breath and generalized weakness.  Patient reports 2 weeks of symptoms with increasing shortness of breath over the last 2 weeks.  She took a Zpack prescribed by her PCP empirically.  Review of Systems  Positive: Cough, SOB Negative: FCS  Physical Exam  BP 118/61 (BP Location: Right Arm)   Pulse 86   Temp 98.6 F (37 C) (Oral)   Resp 18   SpO2 91%  Gen:   Awake, no distress  NAD Resp:  Normal effort  MSK:   Moves extremities without difficulty  Other:    Medical Decision Making  Medically screening exam initiated at 4:25 PM.  Appropriate orders placed.  Bridget Deleon was informed that the remainder of the evaluation will be completed by another provider, this initial triage assessment does not replace that evaluation, and the importance of remaining in the ED until their evaluation is complete.  Geriatric patient to the ED for evaluation of 2 weeks of cough & SOB.  She presents hypoxic to the ED with room air sats in the high 80s.   Bridget Needles, PA-C 04/19/22 1627

## 2022-04-20 DIAGNOSIS — J101 Influenza due to other identified influenza virus with other respiratory manifestations: Secondary | ICD-10-CM | POA: Diagnosis not present

## 2022-04-20 LAB — CBC
HCT: 36.1 % (ref 36.0–46.0)
Hemoglobin: 11.8 g/dL — ABNORMAL LOW (ref 12.0–15.0)
MCH: 30.2 pg (ref 26.0–34.0)
MCHC: 32.7 g/dL (ref 30.0–36.0)
MCV: 92.3 fL (ref 80.0–100.0)
Platelets: 205 10*3/uL (ref 150–400)
RBC: 3.91 MIL/uL (ref 3.87–5.11)
RDW: 13.4 % (ref 11.5–15.5)
WBC: 9.1 10*3/uL (ref 4.0–10.5)
nRBC: 0 % (ref 0.0–0.2)

## 2022-04-20 LAB — BASIC METABOLIC PANEL
Anion gap: 8 (ref 5–15)
BUN: 15 mg/dL (ref 8–23)
CO2: 25 mmol/L (ref 22–32)
Calcium: 8.4 mg/dL — ABNORMAL LOW (ref 8.9–10.3)
Chloride: 104 mmol/L (ref 98–111)
Creatinine, Ser: 0.7 mg/dL (ref 0.44–1.00)
GFR, Estimated: >60 mL/min (ref >60)
Glucose, Bld: 153 mg/dL — ABNORMAL HIGH (ref 70–99)
Potassium: 3.5 mmol/L (ref 3.5–5.1)
Sodium: 137 mmol/L (ref 135–145)

## 2022-04-20 MED ORDER — GUAIFENESIN ER 600 MG PO TB12
600.0000 mg | ORAL_TABLET | Freq: Two times a day (BID) | ORAL | Status: DC
  Administered 2022-04-20 – 2022-04-21 (×3): 600 mg via ORAL
  Filled 2022-04-20 (×3): qty 1

## 2022-04-20 MED ORDER — SERTRALINE HCL 50 MG PO TABS
50.0000 mg | ORAL_TABLET | Freq: Every day | ORAL | Status: DC
  Administered 2022-04-20 – 2022-04-21 (×2): 50 mg via ORAL
  Filled 2022-04-20 (×2): qty 1

## 2022-04-20 MED ORDER — MELATONIN 5 MG PO TABS
5.0000 mg | ORAL_TABLET | Freq: Every day | ORAL | Status: DC
  Administered 2022-04-20: 5 mg via ORAL
  Filled 2022-04-20: qty 1

## 2022-04-20 MED ORDER — FLUTICASONE FUROATE-VILANTEROL 100-25 MCG/ACT IN AEPB
1.0000 | INHALATION_SPRAY | Freq: Every day | RESPIRATORY_TRACT | Status: DC
  Administered 2022-04-21: 1 via RESPIRATORY_TRACT
  Filled 2022-04-20: qty 28

## 2022-04-20 MED ORDER — UMECLIDINIUM BROMIDE 62.5 MCG/ACT IN AEPB
1.0000 | INHALATION_SPRAY | Freq: Every day | RESPIRATORY_TRACT | Status: DC
  Administered 2022-04-21: 1 via RESPIRATORY_TRACT
  Filled 2022-04-20: qty 7

## 2022-04-20 MED ORDER — IPRATROPIUM-ALBUTEROL 0.5-2.5 (3) MG/3ML IN SOLN
3.0000 mL | Freq: Four times a day (QID) | RESPIRATORY_TRACT | Status: DC
  Administered 2022-04-20: 3 mL via RESPIRATORY_TRACT
  Filled 2022-04-20 (×2): qty 3

## 2022-04-20 MED ORDER — PREDNISONE 20 MG PO TABS
40.0000 mg | ORAL_TABLET | Freq: Every day | ORAL | Status: DC
  Administered 2022-04-21: 40 mg via ORAL
  Filled 2022-04-20: qty 2

## 2022-04-20 MED ORDER — ENOXAPARIN SODIUM 40 MG/0.4ML IJ SOSY
40.0000 mg | PREFILLED_SYRINGE | INTRAMUSCULAR | Status: DC
  Administered 2022-04-20: 40 mg via SUBCUTANEOUS
  Filled 2022-04-20: qty 0.4

## 2022-04-20 MED ORDER — HYDRALAZINE HCL 20 MG/ML IJ SOLN: 5.0000 mg | Freq: Four times a day (QID) | INTRAMUSCULAR | Status: DC | PRN

## 2022-04-20 MED ORDER — OSELTAMIVIR PHOSPHATE 30 MG PO CAPS
30.0000 mg | ORAL_CAPSULE | Freq: Two times a day (BID) | ORAL | Status: DC
  Administered 2022-04-20 – 2022-04-21 (×3): 30 mg via ORAL
  Filled 2022-04-20 (×3): qty 1

## 2022-04-20 MED ORDER — OSELTAMIVIR PHOSPHATE 75 MG PO CAPS: 75.0000 mg | ORAL_CAPSULE | Freq: Two times a day (BID) | ORAL | Status: DC

## 2022-04-20 MED ORDER — ALBUTEROL SULFATE (2.5 MG/3ML) 0.083% IN NEBU: 2.5000 mg | INHALATION_SOLUTION | RESPIRATORY_TRACT | Status: DC | PRN

## 2022-04-20 NOTE — Progress Notes (Signed)
PHARMACY NOTE:  ANTIMICROBIAL/antiviral RENAL DOSAGE ADJUSTMENT  Current antimicrobial/antiviral regimen includes a mismatch between antimicrobial/antiviral  dosage and estimated renal function.  As per policy approved by the Pharmacy & Therapeutics and Medical Executive Committees, the antimicrobial/ antiviral dosage will be adjusted accordingly.  Current antiviral  dosage:  tamiflu '75mg'$  po bid  Indication: flu  Renal Function: Estimated Creatinine Clearance: 54.4 mL/min (by C-G formula based on SCr of 0.81 mg/dL).     antiviral dosage has been changed to:   tamiflu 30 bid for Crcl < 60 ml/min  Additional comments:   Thank you for allowing pharmacy to be a part of this patient's care.  Orlanda Frankum A, [facility] 04/20/2022 9:14 AM

## 2022-04-20 NOTE — Progress Notes (Signed)
Mobility Specialist - Progress Note     04/20/22 1200  Mobility  Activity Ambulated with assistance in hallway;Stood at bedside  Level of Assistance Standby assist, set-up cues, supervision of patient - no hands on  Assistive Device None  Distance Ambulated (ft) 80 ft  Activity Response Tolerated well  Mobility Referral Yes  $Mobility charge 1 Mobility   Pt resting in bed on RA upon entry. Pt STS and ambulates in hallway SBA. Pt lightly furniture/wall surfs and declined HHA or assistance with lines while walking. Pt began displaying SOB at 40f (SPO2 level was 91%) and took a 2-3 minute seated rest break before returning to bed. Pt left with needs in reach and family present in room.   ALoma SenderMobility Specialist 04/20/22, 12:58 PM

## 2022-04-20 NOTE — ED Notes (Signed)
Patient ambulated to restroom with ease.

## 2022-04-20 NOTE — Evaluation (Signed)
Occupational Therapy Evaluation Patient Details Name: Bridget Deleon MRN: [id number] DOB: [birth year] Today's Date: 04/20/2022   History of Present Illness Pt is a 82 y.o. female presenting to hospital 04/19/22 with c/o SOB, generalized weakness, and cough x3 days.  Pt noted to have SOB and hypoxia on presentation.  (+) influenza A.  PMH includes arthritis, gastric reflux, COPD, h/o smoking, adenocarcinoma of lung s/p R lobectomy, chronic LBP, CAD, IBS, HLD, GERD, lumbar laminectomy/decompression.   Clinical Impression   Upon entering the room, pt supine in bed with no c/o pain and agreeable to OT intervention. Pt reports living with multiple family members and being Ind at baseline. She has recently started using rollator for safety secondary to weakness. Pt on RA during session and remaining above 90%. Pt reports feeling some weakness but close to baseline. Mod I for bed mobility and pt sits EOB to open all containers of breakfast tray. OT educated pt on energy conservation techniques for self care and functional mobility with recommendation to use rollator to increase safety. Pt verbalized understanding. She plans on giving driving up and family will take her to appointments. Pt declines further OT intervention this session and is likely close to her baseline. OT does recommend mobility team to work with pt while here to increase endurance before returning home.  OT to complete order at this time.      Recommendations for follow up therapy are one component of a multi-disciplinary discharge planning process, led by the attending physician.  Recommendations may be updated based on patient status, additional functional criteria and insurance authorization.   Follow Up Recommendations  No OT follow up     Assistance Recommended at Discharge Intermittent Supervision/Assistance  Patient can return home with the following Assistance with cooking/housework;Assist for transportation       Equipment  Recommendations  None recommended by OT       Precautions / Restrictions Precautions Precautions: Fall Restrictions Weight Bearing Restrictions: No      Mobility Bed Mobility Overal bed mobility: Modified Independent             General bed mobility comments: no difficulties noted    Transfers Overall transfer level: Needs assistance Equipment used: None Transfers: Sit to/from Stand Sit to Stand: Supervision                  Balance Overall balance assessment: Needs assistance Sitting-balance support: No upper extremity supported, Feet supported Sitting balance-Leahy Scale: Normal     Standing balance support: No upper extremity supported, During functional activity Standing balance-Leahy Scale: Good                             ADL either performed or assessed with clinical judgement   ADL Overall ADL's : Needs assistance/impaired Eating/Feeding: Independent;Sitting   Grooming: Wash/dry hands;Wash/dry face;Sitting;Supervision/safety                                       Vision Patient Visual Report: No change from baseline              Pertinent Vitals/Pain Pain Assessment Pain Assessment: No/denies pain     Hand Dominance Right   Extremity/Trunk Assessment Upper Extremity Assessment Upper Extremity Assessment: Overall WFL for tasks assessed   Lower Extremity Assessment Lower Extremity Assessment: Generalized weakness   Cervical / Trunk Assessment Cervical /  Trunk Assessment: Normal   Communication Communication Communication: No difficulties   Cognition Arousal/Alertness: Awake/alert Behavior During Therapy: WFL for tasks assessed/performed Overall Cognitive Status: Within Functional Limits for tasks assessed                                                  Home Living Family/patient expects to be discharged to:: Private residence Living Arrangements: Other relatives (Pt lives with  multiple family members) Available Help at Discharge: Family Type of Home: House Home Access: Stairs to enter Technical brewer of Steps: 1 Entrance Stairs-Rails: None Home Layout: Two level;Able to live on main level with bedroom/bathroom     Bathroom Shower/Tub: Occupational psychologist: Standard     Home Equipment: Rollator (4 wheels)          Prior Functioning/Environment Prior Level of Function : Independent/Modified Independent             Mobility Comments: No AD use typically but recently using 4ww d/t feeling weak. ADLs Comments: Pt reports plan to give up her license d/t not feeling safe driving anymore. She reports Ind in self care and IADLs but will have family to assist as needed.                 OT Goals(Current goals can be found in the care plan section) Acute Rehab OT Goals Patient Stated Goal: to return home and stop coughing OT Goal Formulation: With patient Time For Goal Achievement: 04/20/22 Potential to Achieve Goals: Good  OT Frequency:         AM-PAC OT "6 Clicks" Daily Activity     Outcome Measure Help from another person eating meals?: None Help from another person taking care of personal grooming?: None Help from another person toileting, which includes using toliet, bedpan, or urinal?: None Help from another person bathing (including washing, rinsing, drying)?: None Help from another person to put on and taking off regular upper body clothing?: None Help from another person to put on and taking off regular lower body clothing?: None 6 Click Score: 24   End of Session Nurse Communication: Mobility status  Activity Tolerance: Patient tolerated treatment well Patient left: in bed;with call bell/phone within reach;with bed alarm set                   Time: 1000-1015 OT Time Calculation (min): 15 min Charges:  OT General Charges $OT Visit: 1 Visit OT Evaluation $OT Eval Low Complexity: 1 Low  Darleen Crocker, MS,  OTR/L , CBIS ascom [phone]  04/20/22, 11:16 AM

## 2022-04-20 NOTE — Evaluation (Signed)
Physical Therapy Evaluation Patient Details Name: Bridget Deleon MRN: [id number] DOB: [birth year] Today's Date: 04/20/2022  History of Present Illness  Pt is a 82 y.o. female presenting to hospital 04/19/22 with c/o SOB, generalized weakness, and cough x3 days.  Pt noted to have SOB and hypoxia on presentation.  (+) influenza A.  PMH includes arthritis, gastric reflux, COPD, h/o smoking, adenocarcinoma of lung s/p R lobectomy, chronic LBP, CAD, IBS, HLD, GERD, lumbar laminectomy/decompression.  Clinical Impression  Prior to hospital admission, pt was most recently using rollator for ambulation d/t feeling weak (no AD use prior to that); lives with family and stays on main level.  Currently pt is modified independent with bed mobility; SBA with transfers; and CGA ambulating 100 feet (no AD use)--pt reporting LE's feeling weak when ambulating.  Pt's O2 sats 90% on room air post ambulation (SOB noted) but within a minute of sitting rest pt's O2 sats 94% or greater on room air and SOB improved within a couple minutes of sitting rest.  No c/o pain during session.  Pt would benefit from skilled PT to address noted impairments and functional limitations (see below for any additional details).  Upon hospital discharge, pt would benefit from [facility] (pt reports she does not feel she will need HHPT upon hospital discharge); anticipate pt will benefit from continued use of rollator for mobility as well.    Recommendations for follow up therapy are one component of a multi-disciplinary discharge planning process, led by the attending physician.  Recommendations may be updated based on patient status, additional functional criteria and insurance authorization.  Follow Up Recommendations Home health PT      Assistance Recommended at Discharge Set up Supervision/Assistance  Patient can return home with the following  A little help with walking and/or transfers;A little help with bathing/dressing/bathroom;Assistance  with cooking/housework;Assist for transportation;Help with stairs or ramp for entrance    Equipment Recommendations None recommended by PT (pt has rollator at home already she has been using)  Recommendations for Other Services  OT consult    Functional Status Assessment Patient has had a recent decline in their functional status and demonstrates the ability to make significant improvements in function in a reasonable and predictable amount of time.     Precautions / Restrictions Precautions Precautions: Fall Restrictions Weight Bearing Restrictions: No      Mobility  Bed Mobility   Bed Mobility: Supine to Sit, Sit to Supine     Supine to sit: Modified independent (Device/Increase time), HOB elevated Sit to supine: Modified independent (Device/Increase time), HOB elevated   General bed mobility comments: no difficulties noted    Transfers Overall transfer level: Needs assistance Equipment used: None Transfers: Sit to/from Stand Sit to Stand: Supervision           General transfer comment: mild increased effort to stand from bed but overall steady    Ambulation/Gait Ambulation/Gait assistance: Min guard Gait Distance (Feet): 100 Feet Assistive device: None   Gait velocity: decreased     General Gait Details: mostly step through gait pattern  Stairs            Wheelchair Mobility    Modified Rankin (Stroke Patients Only)       Balance Overall balance assessment: Needs assistance Sitting-balance support: No upper extremity supported, Feet supported Sitting balance-Leahy Scale: Normal Sitting balance - Comments: steady sitting reaching outside BOS   Standing balance support: No upper extremity supported, During functional activity Standing balance-Leahy Scale: Good Standing balance comment: no  loss of balance noted with ambulation (without UE support)                             Pertinent Vitals/Pain Pain Assessment Pain  Assessment: No/denies pain HR WFL during sessions activities.    Home Living Family/patient expects to be discharged to:: Private residence Living Arrangements: Other relatives (Pt's son in law, granddaughter (about 45 y.o.) and great grandson (82 y.o.).) Available Help at Discharge: Family Type of Home: House Home Access: Stairs to enter Entrance Stairs-Rails: None Entrance Stairs-Number of Steps: 1   Home Layout: Two level;Able to live on main level with bedroom/bathroom Home Equipment: Rollator (4 wheels)      Prior Function Prior Level of Function : Independent/Modified Independent             Mobility Comments: No AD use typically but recently using 4ww d/t feeling weak. ADLs Comments: Pt reports plan to give up her license d/t not feeling safe driving anymore.     Hand Dominance        Extremity/Trunk Assessment   Upper Extremity Assessment Upper Extremity Assessment: Defer to OT evaluation;Overall WFL for tasks assessed    Lower Extremity Assessment Lower Extremity Assessment: Generalized weakness    Cervical / Trunk Assessment Cervical / Trunk Assessment: Normal  Communication   Communication: No difficulties  Cognition Arousal/Alertness: Awake/alert Behavior During Therapy: Flat affect Overall Cognitive Status: Within Functional Limits for tasks assessed                                          General Comments  Nursing cleared pt for participation in physical therapy.  Pt agreeable to PT session.    Exercises     Assessment/Plan    PT Assessment Patient needs continued PT services  PT Problem List Decreased strength;Decreased activity tolerance;Decreased balance;Decreased mobility;Cardiopulmonary status limiting activity       PT Treatment Interventions DME instruction;Gait training;Functional mobility training;Therapeutic activities;Therapeutic exercise;Balance training;Patient/family education;Stair training    PT Goals  (Current goals can be found in the Care Plan section)  Acute Rehab PT Goals Patient Stated Goal: to improve strength and endurance PT Goal Formulation: With patient Time For Goal Achievement: 05/04/22 Potential to Achieve Goals: Good    Frequency Min 2X/week     Co-evaluation               AM-PAC PT "6 Clicks" Mobility  Outcome Measure Help needed turning from your back to your side while in a flat bed without using bedrails?: None Help needed moving from lying on your back to sitting on the side of a flat bed without using bedrails?: None Help needed moving to and from a bed to a chair (including a wheelchair)?: A Little Help needed standing up from a chair using your arms (e.g., wheelchair or bedside chair)?: A Little Help needed to walk in hospital room?: A Little Help needed climbing 3-5 steps with a railing? : A Little 6 Click Score: 20    End of Session   Activity Tolerance: Patient limited by fatigue Patient left: in bed;with call bell/phone within reach;with bed alarm set Nurse Communication: Mobility status;Precautions;Other (comment) (pt's O2 sats during session) PT Visit Diagnosis: Other abnormalities of gait and mobility (R26.89);Muscle weakness (generalized) (M62.81)    Time: 1443-1540 PT Time Calculation (min) (ACUTE ONLY): 20 min  Charges:   PT Evaluation $PT Eval Low Complexity: 1 Low         Keyry Iracheta, PT 04/20/22, 9:16 AM

## 2022-04-20 NOTE — ED Notes (Signed)
Pt independently ambulatory to bathroom

## 2022-04-20 NOTE — Progress Notes (Signed)
[facility] Progress Note  Patient: Bridget Deleon    UXN:[id number]  DOA: 04/19/2022     Date of Service: the patient was seen and examined on 04/20/2022  Chief Complaint  Patient presents with   Shortness of Breath   Brief hospital course:  Bridget Deleon is a 82 y.o. 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  She presented from home with a 3-day history of cough and shortness of breath.  She denies any known sick contacts.  Cough is nonproductive.  No associated fever or chills.  She also admits to generalized weakness and difficulty with walking.  She has had poor oral intake.  She was also noted to have shortness of breath and hypoxia on presentation.   Workup in the ED revealed influenza A infection.  Chest x-ray however unremarkable for any acute infiltrates.  Due to patient's age, pulmonary comorbidities and need for oxygen, patient is being admitted to hospitalist service for further management.  Patient was started on oseltamavir in the ER.   Assessment and Plan:  Influenza A viral infection prodrome symptoms plan suspected viral bronchitis.  Patient was initiated on oseltamavir.  Patient will be gently hydrated.  Advance diet as tolerated by patient.  Guaifenesin, benzonatate will be prescribed.   Acute hypoxia secondary to COPD exacerbation most likely secondary to influenza A infection.  Patient will be given as needed bronchodilators.  Patient is on inhaled corticosteroids at home.  Currently requiring oxygen supplementation.   Mild elevated D-dimer: Using age-adjusted D-dimer measures, patient is less likely to have fatigue based on her numbers.  Medication for CT angiogram at this time.  Last CTA was in August 2023.   Hypokalemia: Potassium replacement protocol will be instituted.   Chronic back  pain: Patient will continue home medications with opiates.  She is also on NSAIDs.   Irritable bowel syndrome: Patient is on Bentyl as needed twice daily.   Hypertension: Patient is on Cozaar and nifedipine: Will resume home medications as appropriate and as tolerated by blood pressure.   Osteoporosis: Patient is on Fosamax   Acute on chronic debility: Physical therapy to evaluate patient in AM.    Body mass index is 19.94 kg/m.  Interventions:     Diet: Carb modified diet DVT Prophylaxis: Subcutaneous Lovenox   Advance goals of care discussion: Full code  Family Communication: family was present at bedside, at the time of interview.  The pt provided permission to discuss medical plan with the family. Opportunity was given to ask question and all questions were answered satisfactorily.   Disposition:  Pt is from Home, admitted with influenza A viral infection, feels generalized weakness, still has generalized weakness, needs to be seen by PT/OT improvement, which precludes a safe discharge. Discharge to home, most likely tomorrow a.m.  Subjective: No significant overnight events, patient is feeling generalized weakness, having mild cough and mild shortness of breath, saturating well on room air. Denies any chest pain or palpitations, no any other active issues.  Physical Exam: General:  alert oriented to time, place, and person.  Appear in mild distress, affect appropriate Eyes: PERRLA ENT: Oral Mucosa Clear, moist  Neck: no JVD,  Cardiovascular: S1 and S2 Present, no Murmur,  Respiratory: good respiratory effort, Bilateral Air entry equal and Decreased, mild Crackles, no wheezes Abdomen: Bowel Sound present, Soft and no tenderness,  Skin: no rashes  Extremities: no Pedal edema, no calf tenderness Neurologic: without any new focal findings Gait not checked due to patient safety concerns  Vitals:   04/20/22 0900 04/20/22 0901 04/20/22 1154 04/20/22 1300  BP: (!) 113/93    125/73  Pulse: 84 74  76  Resp: (!) '29 19  18  '$ Temp:   98 F (36.7 C)   TempSrc:   Oral   SpO2: 94% 92%  93%  Weight:      Height:       No intake or output data in the 24 hours ending 04/20/22 1653 Filed Weights   04/19/22 1625  Weight: 61.2 kg    Data Reviewed: I have personally reviewed and interpreted daily labs, tele strips, imagings as discussed above. I reviewed all nursing notes, pharmacy notes, vitals, pertinent old records I have discussed plan of care as described above with RN and patient/family.  CBC: Recent Labs  Lab 04/19/22 1630 04/20/22 1050  WBC 12.1* 9.1  NEUTROABS 8.9*  --   HGB 13.0 11.8*  HCT 39.6 36.1  MCV 93.4 92.3  PLT 221 300   Basic Metabolic Panel: Recent Labs  Lab 04/19/22 1630 04/20/22 1050  NA 135 137  K 3.4* 3.5  CL 101 104  CO2 24 25  GLUCOSE 130* 153*  BUN 24* 15  CREATININE 0.81 0.70  CALCIUM 9.0 8.4*    Studies: DG Chest 2 View  Result Date: 04/19/2022 CLINICAL DATA:  Shortness of breath and chest pain. EXAM: CHEST - 2 VIEW COMPARISON:  12/09/2021 CT and 01/03/2012 chest radiograph FINDINGS: Patient is rotated to the RIGHT. The cardiomediastinal silhouette is unchanged given technique. There is no evidence of focal airspace disease, pulmonary edema, suspicious pulmonary nodule/mass, pleural effusion, or pneumothorax. No acute bony abnormalities are identified. IMPRESSION: No acute cardiopulmonary disease. Electronically Signed   By: Margarette Canada M.D.   On: 04/19/2022 17:33    Scheduled Meds:  calcium-vitamin D  1 tablet Oral q morning   enoxaparin (LOVENOX) injection  40 mg Subcutaneous Q24H   fluticasone furoate-vilanterol  1 puff Inhalation Daily   And   umeclidinium bromide  1 puff Inhalation Daily   guaiFENesin  600 mg Oral BID   ipratropium-albuterol  3 mL Nebulization Q6H   melatonin  5 mg Oral QHS   oseltamivir  30 mg Oral BID   [START ON 04/21/2022] predniSONE  40 mg Oral Q breakfast   sertraline  50 mg Oral Daily    Continuous Infusions: PRN Meds: albuterol, benzonatate, dicyclomine, hydrALAZINE, ibuprofen, ondansetron, oxyCODONE-acetaminophen, senna-docusate  Time spent: 35 minutes  Author: Val Riles. MD [facility] 04/20/2022 4:53 PM  To reach On-call, see care teams to locate the attending and reach out to them via [email or web address]. If 7PM-7AM, please contact night-coverage If you still have difficulty reaching the attending provider, please page the [facility] (Director on Call) for [facility] on amion for assistance.

## 2022-04-20 NOTE — ED Notes (Signed)
Pt complaining of nausea. Prn medication given. Pt requested gingerale and saltines and tolerating well.

## 2022-04-20 NOTE — ED Notes (Signed)
Pt provided with ginger ale per request

## 2022-04-21 DIAGNOSIS — J101 Influenza due to other identified influenza virus with other respiratory manifestations: Secondary | ICD-10-CM | POA: Diagnosis not present

## 2022-04-21 MED ORDER — OSELTAMIVIR PHOSPHATE 30 MG PO CAPS: 30.0000 mg | ORAL_CAPSULE | Freq: Two times a day (BID) | ORAL | 0 refills | Status: AC

## 2022-04-21 MED ORDER — GUAIFENESIN-DM 100-10 MG/5ML PO SYRP: 5.0000 mL | ORAL_SOLUTION | ORAL | 0 refills | Status: AC | PRN

## 2022-04-21 NOTE — Discharge Summary (Signed)
[facility] Discharge Summary   Patient: Bridget Deleon NMM:[id number]  PCP: Tracie Harrier, MD  Date of admission: 04/19/2022   Date of discharge:  04/21/2022     Discharge Diagnoses:  Principal Problem:   Influenza A   Admitted From: Home Disposition:  Home   Recommendations for Outpatient Follow-up:  PCP: in 1 wk Follow up LABS/TEST:     Diet recommendation: Cardiac diet  Activity: The patient is advised to gradually reintroduce usual activities, as tolerated  Discharge Condition: stable  Code Status: Full code   History of present illness: As per the H and P dictated on admission Hospital Course:  Bridget Deleon is a 82 y.o. 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  She presented from home with a 3-day history of cough and shortness of breath.  She denies any known sick contacts.  Cough is nonproductive.  No associated fever or chills.  She also admits to generalized weakness and difficulty with walking.  She has had poor oral intake.  She was also noted to have shortness of breath and hypoxia on presentation. Workup in the ED revealed influenza A infection.  Chest x-ray however unremarkable for any acute infiltrates.  Due to patient's age, pulmonary comorbidities and need for oxygen, patient is being admitted to hospitalist service for further management.  Patient was started on oseltamavir in the ER.   Assessment and Plan: # Influenza A viral infection prodrome symptoms plan suspected viral bronchitis.  Patient was initiated on oseltamavir.  s/p gentle hydration and symptomatic treatment given.  Patient remained on room air saturating well.  Patient was advised to continue Tamiflu for total 5 days and follow with PCP. # Acute hypoxia secondary to COPD exacerbation most likely secondary to  influenza A infection.  Inhaler and nebulizer were continued, patient's O2 saturation improved, saturating well on room air. # Mild elevated D-dimer: Using age-adjusted D-dimer measures, patient is less likely to have fatigue based on her numbers.  Medication for CT angiogram at this time.  Last CTA was in August 2023. # Hypokalemia: Potassium repleted. Resolved  # Chronic back pain: continue home medications. # Irritable bowel syndrome: Patient is on Bentyl as needed twice daily. # Hypertension: Continue Cozaar and nifedipine, advised to monitor BP at home and follow with PCP. # Osteoporosis: continues Fosamax # Acute on chronic debility, feeling generalized weakness but improved, ambulating well.  Seen by PT, recommended home health PT, OT recommended no needs.     Body mass index is 19.94 kg/m.  Interventions:  - Patient was instructed, not to drive, operate heavy machinery, perform activities at heights, swimming or participation in water activities or provide baby sitting services while on Pain, Sleep and Anxiety Medications; until her outpatient Physician has advised to do so again.  - Also recommended to not to take more than prescribed Pain, Sleep and Anxiety Medications.  Patient was ambulatory without any assistance. On the day of the discharge the patient's vitals were stable, and no other acute medical condition were reported by patient. the patient was felt safe to be discharge at Home.  Consultants: None Procedures: None  Discharge Exam: General: Appear in no distress, no Rash; Oral Mucosa Clear, moist. Cardiovascular: S1 and S2 Present, no Murmur, Respiratory: normal respiratory effort, Bilateral Air entry present and no Crackles, no wheezes Abdomen: Bowel Sound present, Soft and no tenderness, no  hernia Extremities: no Pedal edema, no calf tenderness Neurology: alert and oriented to time, place, and person affect appropriate.  Filed Weights   04/19/22 1625  Weight:  61.2 kg   Vitals:   04/20/22 1754 04/20/22 2351  BP: 128/65 114/62  Pulse: 71 76  Resp: 18 18  Temp: 98.5 F (36.9 C) 98.9 F (37.2 C)  SpO2: 96% 97%    DISCHARGE MEDICATION: Allergies as of 04/21/2022       Reactions   Iron Rash   Penicillins Rash   Adhesive [tape]    Tears skin   Calamine    Burns skin   Codeine Nausea And Vomiting   Vicodin [hydrocodone-acetaminophen] Nausea And Vomiting        Medication List     STOP taking these medications    meloxicam 15 MG tablet Commonly known as: MOBIC   prednisoLONE 5 MG Tabs tablet       TAKE these medications    alendronate 70 MG tablet Commonly known as: FOSAMAX Take 70 mg by mouth every 7 (seven) days. On Saturday.  Take with a full glass of water on an empty stomach.   benzonatate 200 MG capsule Commonly known as: TESSALON Take 200 mg by mouth 3 (three) times daily as needed for cough.   Calcium-D 600-400 MG-UNIT Tabs Take 1 tablet by mouth every morning.   cholecalciferol 1000 units tablet Commonly known as: VITAMIN D Take 1,000 Units by mouth every morning.   CRANBERRY PO Take 1,500 mg by mouth every morning.   dicyclomine 20 MG tablet Commonly known as: BENTYL Take 20 mg by mouth 3 (three) times daily as needed for spasms.   diphenhydramine-acetaminophen 25-500 MG Tabs tablet Commonly known as: TYLENOL PM Take 1 tablet by mouth at bedtime as needed.   fluticasone 50 MCG/ACT nasal spray Commonly known as: FLONASE Place 1 spray into both nostrils daily.   gentamicin cream 0.1 % Commonly known as: GARAMYCIN Apply 1 application topically 3 (three) times daily as needed. What changed: Another medication with the same name was removed. Continue taking this medication, and follow the directions you see here.   guaiFENesin-dextromethorphan 100-10 MG/5ML syrup Commonly known as: ROBITUSSIN DM Take 5 mLs by mouth every 4 (four) hours as needed for cough.   hydrocortisone 2.5 % cream Apply  topically 2 (two) times daily as needed.   ibuprofen 800 MG tablet Commonly known as: ADVIL Take 800 mg by mouth 2 (two) times daily as needed. For pain   losartan 100 MG tablet Commonly known as: COZAAR Take by mouth.   methocarbamol 500 MG tablet Commonly known as: ROBAXIN Take 500 mg by mouth 3 (three) times daily as needed for muscle spasms.   NIFEdipine 30 MG 24 hr tablet Commonly known as: PROCARDIA-XL/NIFEDICAL-XL Take 30 mg by mouth daily.   ondansetron 8 MG disintegrating tablet Commonly known as: ZOFRAN-ODT Take 8 mg by mouth every 8 (eight) hours as needed for nausea or vomiting.   oseltamivir 30 MG capsule Commonly known as: TAMIFLU Take 1 capsule (30 mg total) by mouth 2 (two) times daily for 4 days.   oxyCODONE-acetaminophen 5-325 MG tablet Commonly known as: PERCOCET/ROXICET Take by mouth every 4 (four) hours as needed for severe pain.   senna-docusate 8.6-50 MG tablet Commonly known as: Senokot-S Take 2 tablets by mouth at bedtime as needed. For constipation   sertraline 50 MG tablet Commonly known as: ZOLOFT Take 1 tablet by mouth daily.   torsemide 20 MG tablet Commonly  known as: DEMADEX Take 20 mg by mouth 3 (three) times a week.   traMADol 50 MG tablet Commonly known as: ULTRAM Take by mouth every 6 (six) hours as needed.   Trelegy Ellipta 100-62.5-25 MCG/ACT Aepb Generic drug: Fluticasone-Umeclidin-Vilant Inhale 1 puff into the lungs daily.   triamcinolone cream 0.1 % Commonly known as: KENALOG Apply 1 application topically 2 (two) times daily as needed.       Allergies  Allergen Reactions   Iron Rash   Penicillins Rash   Adhesive [Tape]     Tears skin   Calamine     Burns skin   Codeine Nausea And Vomiting   Vicodin [Hydrocodone-Acetaminophen] Nausea And Vomiting   Discharge Instructions     Call MD for:  difficulty breathing, headache or visual disturbances   Complete by: As directed    Call MD for:  extreme fatigue    Complete by: As directed    Call MD for:  persistant dizziness or light-headedness   Complete by: As directed    Call MD for:  temperature >100.4   Complete by: As directed    Diet - low sodium heart healthy   Complete by: As directed    Discharge instructions   Complete by: As directed    Follow-up with PCP in 1 week   Increase activity slowly   Complete by: As directed        The results of significant diagnostics from this hospitalization (including imaging, microbiology, ancillary and laboratory) are listed below for reference.    Significant Diagnostic Studies: DG Chest 2 View  Result Date: 04/19/2022 CLINICAL DATA:  Shortness of breath and chest pain. EXAM: CHEST - 2 VIEW COMPARISON:  12/09/2021 CT and 01/03/2012 chest radiograph FINDINGS: Patient is rotated to the RIGHT. The cardiomediastinal silhouette is unchanged given technique. There is no evidence of focal airspace disease, pulmonary edema, suspicious pulmonary nodule/mass, pleural effusion, or pneumothorax. No acute bony abnormalities are identified. IMPRESSION: No acute cardiopulmonary disease. Electronically Signed   By: Margarette Canada M.D.   On: 04/19/2022 17:33    Microbiology: Recent Results (from the past 240 hour(s))  Resp panel by RT-PCR (RSV, Flu A&B, Covid) Anterior Nasal Swab     Status: Abnormal   Collection Time: 04/19/22  4:30 PM   Specimen: Anterior Nasal Swab  Result Value Ref Range Status   SARS Coronavirus 2 by RT PCR NEGATIVE NEGATIVE Final    Comment: (NOTE) SARS-CoV-2 target nucleic acids are NOT DETECTED.  The SARS-CoV-2 RNA is generally detectable in upper respiratory specimens during the acute phase of infection. The lowest concentration of SARS-CoV-2 viral copies this assay can detect is 138 copies/mL. A negative result does not preclude SARS-Cov-2 infection and should not be used as the sole basis for treatment or other patient management decisions. A negative result may occur with  improper  specimen collection/handling, submission of specimen other than nasopharyngeal swab, presence of viral mutation(s) within the areas targeted by this assay, and inadequate number of viral copies(<138 copies/mL). A negative result must be combined with clinical observations, patient history, and epidemiological information. The expected result is Negative.  Fact Sheet for Patients:  EntrepreneurPulse.com.au  Fact Sheet for Healthcare Providers:  IncredibleEmployment.be  This test is no t yet approved or cleared by the [place] FDA and  has been authorized for detection and/or diagnosis of SARS-CoV-2 by FDA under an Emergency Use Authorization (EUA). This EUA will remain  in effect (meaning this test can be used) for  the duration of the COVID-19 declaration under Section 564(b)(1) of the Act, 21 U.S.C.section 360bbb-3(b)(1), unless the authorization is terminated  or revoked sooner.       Influenza A by PCR POSITIVE (A) NEGATIVE Final   Influenza B by PCR NEGATIVE NEGATIVE Final    Comment: (NOTE) The Xpert Xpress SARS-CoV-2/FLU/RSV plus assay is intended as an aid in the diagnosis of influenza from Nasopharyngeal swab specimens and should not be used as a sole basis for treatment. Nasal washings and aspirates are unacceptable for Xpert Xpress SARS-CoV-2/FLU/RSV testing.  Fact Sheet for Patients: EntrepreneurPulse.com.au  Fact Sheet for Healthcare Providers: IncredibleEmployment.be  This test is not yet approved or cleared by the [place] FDA and has been authorized for detection and/or diagnosis of SARS-CoV-2 by FDA under an Emergency Use Authorization (EUA). This EUA will remain in effect (meaning this test can be used) for the duration of the COVID-19 declaration under Section 564(b)(1) of the Act, 21 U.S.C. section 360bbb-3(b)(1), unless the authorization is terminated or revoked.     Resp  Syncytial Virus by PCR NEGATIVE NEGATIVE Final    Comment: (NOTE) Fact Sheet for Patients: EntrepreneurPulse.com.au  Fact Sheet for Healthcare Providers: IncredibleEmployment.be  This test is not yet approved or cleared by the [place] FDA and has been authorized for detection and/or diagnosis of SARS-CoV-2 by FDA under an Emergency Use Authorization (EUA). This EUA will remain in effect (meaning this test can be used) for the duration of the COVID-19 declaration under Section 564(b)(1) of the Act, 21 U.S.C. section 360bbb-3(b)(1), unless the authorization is terminated or revoked.  Performed at [facility], [address] [place], [place] [address]      Labs: CBC: Recent Labs  Lab 04/19/22 1630 04/20/22 1050  WBC 12.1* 9.1  NEUTROABS 8.9*  --   HGB 13.0 11.8*  HCT 39.6 36.1  MCV 93.4 92.3  PLT 221 417   Basic Metabolic Panel: Recent Labs  Lab 04/19/22 1630 04/20/22 1050  NA 135 137  K 3.4* 3.5  CL 101 104  CO2 24 25  GLUCOSE 130* 153*  BUN 24* 15  CREATININE 0.81 0.70  CALCIUM 9.0 8.4*   Liver Function Tests: Recent Labs  Lab 04/19/22 1630  AST 19  ALT 13  ALKPHOS 63  BILITOT 0.7  PROT 7.0  ALBUMIN 3.7   No results for input(s): "LIPASE", "AMYLASE" in the last 168 hours. No results for input(s): "AMMONIA" in the last 168 hours. Cardiac Enzymes: No results for input(s): "CKTOTAL", "CKMB", "CKMBINDEX", "TROPONINI" in the last 168 hours. BNP (last 3 results) No results for input(s): "BNP" in the last 8760 hours. CBG: No results for input(s): "GLUCAP" in the last 168 hours.  Time spent: 35 minutes  Signed:  Val Riles  [facility]  04/21/2022 12:35 PM

## 2022-04-21 NOTE — Plan of Care (Signed)

## 2022-08-12 ENCOUNTER — Encounter: Payer: Self-pay | Admitting: *Deleted

## 2022-08-12 ENCOUNTER — Encounter: Payer: BC Managed Care – PPO | Attending: Pulmonary Disease | Admitting: *Deleted

## 2022-08-12 DIAGNOSIS — J449 Chronic obstructive pulmonary disease, unspecified: Secondary | ICD-10-CM

## 2022-08-12 NOTE — Progress Notes (Signed)
Virtual orientation call completed today. shehas an appointment on Date: 5/22024  for EP eval and gym Orientation.  Documentation of diagnosis can be found in C HLDate: 07/07/2022 .

## 2022-08-19 ENCOUNTER — Encounter: Payer: Medicare PPO | Attending: Pulmonary Disease

## 2022-08-19 VITALS — Ht 68.0 in | Wt 134.0 lb

## 2022-08-19 DIAGNOSIS — C801 Malignant (primary) neoplasm, unspecified: Secondary | ICD-10-CM | POA: Insufficient documentation

## 2022-08-19 DIAGNOSIS — J449 Chronic obstructive pulmonary disease, unspecified: Secondary | ICD-10-CM | POA: Diagnosis present

## 2022-08-19 DIAGNOSIS — K219 Gastro-esophageal reflux disease without esophagitis: Secondary | ICD-10-CM | POA: Insufficient documentation

## 2022-08-19 DIAGNOSIS — M810 Age-related osteoporosis without current pathological fracture: Secondary | ICD-10-CM | POA: Insufficient documentation

## 2022-08-19 NOTE — Patient Instructions (Signed)
Patient Instructions  Patient Details  Name: Bridget Deleon MRN: [id number] Date of Birth: [birth year] Referring Provider:  Vida Rigger, MD  Below are your personal goals for exercise, nutrition, and risk factors. Our goal is to help you stay on track towards obtaining and maintaining these goals. We will be discussing your progress on these goals with you throughout the program.  Initial Exercise Prescription:  Initial Exercise Prescription - 08/19/22 1600       Date of Initial Exercise RX and Referring Provider   Date 08/19/22    Referring Provider Vida Rigger MD      Oxygen   Maintain Oxygen Saturation 88% or higher      NuStep   Level 1    SPM 80    Minutes 15    METs 1.4      Biostep-RELP   Level 1    SPM 50    Minutes 15    METs 1.4      Track   Laps 7    Minutes 15    METs 1.44      Prescription Details   Frequency (times per week) 2    Duration Progress to 30 minutes of continuous aerobic without signs/symptoms of physical distress      Intensity   THRR 40-80% of Max Heartrate 99 - 126    Ratings of Perceived Exertion 11-13    Perceived Dyspnea 0-4      Progression   Progression Continue to progress workloads to maintain intensity without signs/symptoms of physical distress.      Resistance Training   Training Prescription Yes    Weight 2 lb    Reps 10-15             Exercise Goals: Frequency: Be able to perform aerobic exercise two to three times per week in program working toward 2-5 days per week of home exercise.  Intensity: Work with a perceived exertion of 11 (fairly light) - 15 (hard) while following your exercise prescription.  We will make changes to your prescription with you as you progress through the program.   Duration: Be able to do 30 to 45 minutes of continuous aerobic exercise in addition to a 5 minute warm-up and a 5 minute cool-down routine.   Nutrition Goals: Your personal nutrition goals will be established when  you do your nutrition analysis with the dietician.  The following are general nutrition guidelines to follow: Cholesterol < 200mg /day Sodium < 1500mg /day Fiber: Women over 50 yrs - 21 grams per day  Personal Goals:  Personal Goals and Risk Factors at Admission - 08/19/22 1636       Core Components/Risk Factors/Patient Goals on Admission    Weight Management Yes;Weight Gain   Per pt request, had lost some weight and would like to regain some back   Intervention Weight Management: Develop a combined nutrition and exercise program designed to reach desired caloric intake, while maintaining appropriate intake of nutrient and fiber, sodium and fats, and appropriate energy expenditure required for the weight goal.;Weight Management: Provide education and appropriate resources to help participant work on and attain dietary goals.    Admit Weight 134 lb (60.8 kg)    Goal Weight: Short Term 137 lb (62.1 kg)    Goal Weight: Long Term 140 lb (63.5 kg)    Expected Outcomes Short Term: Continue to assess and modify interventions until short term weight is achieved;Long Term: Adherence to nutrition and physical activity/exercise program aimed toward attainment of established weight  goal;Weight Gain: Understanding of general recommendations for a high calorie, high protein meal plan that promotes weight gain by distributing calorie intake throughout the day with the consumption for 4-5 meals, snacks, and/or supplements;Understanding recommendations for meals to include 15-35% energy as protein, 25-35% energy from fat, 35-60% energy from carbohydrates, less than 200mg  of dietary cholesterol, 20-35 gm of total fiber daily;Understanding of distribution of calorie intake throughout the day with the consumption of 4-5 meals/snacks    Improve shortness of breath with ADL's Yes    Intervention Provide education, individualized exercise plan and daily activity instruction to help decrease symptoms of SOB with activities  of daily living.    Expected Outcomes Short Term: Improve cardiorespiratory fitness to achieve a reduction of symptoms when performing ADLs;Long Term: Be able to perform more ADLs without symptoms or delay the onset of symptoms    Increase knowledge of respiratory medications and ability to use respiratory devices properly  Yes    Intervention Provide education and demonstration as needed of appropriate use of medications, inhalers, and oxygen therapy.    Expected Outcomes Short Term: Achieves understanding of medications use. Understands that oxygen is a medication prescribed by physician. Demonstrates appropriate use of inhaler and oxygen therapy.;Long Term: Maintain appropriate use of medications, inhalers, and oxygen therapy.    Hypertension Yes    Intervention Provide education on lifestyle modifcations including regular physical activity/exercise, weight management, moderate sodium restriction and increased consumption of fresh fruit, vegetables, and low fat dairy, alcohol moderation, and smoking cessation.;Monitor prescription use compliance.    Expected Outcomes Short Term: Continued assessment and intervention until BP is < 140/23mm HG in hypertensive participants. < 130/60mm HG in hypertensive participants with diabetes, heart failure or chronic kidney disease.;Long Term: Maintenance of blood pressure at goal levels.             Tobacco Use Initial Evaluation: Social History   Tobacco Use  Smoking Status Former   Packs/day: 1.00   Years: 55.00   Additional pack years: 0.00   Total pack years: 55.00   Types: Cigarettes   Quit date: 2009   Years since quitting: 15.3  Smokeless Tobacco Never    Exercise Goals and Review:  Exercise Goals     Row Name 08/19/22 1636             Exercise Goals   Increase Physical Activity Yes       Intervention Provide advice, education, support and counseling about physical activity/exercise needs.;Develop an individualized exercise  prescription for aerobic and resistive training based on initial evaluation findings, risk stratification, comorbidities and participant's personal goals.       Expected Outcomes Short Term: Attend rehab on a regular basis to increase amount of physical activity.;Long Term: Add in home exercise to make exercise part of routine and to increase amount of physical activity.;Long Term: Exercising regularly at least 3-5 days a week.       Increase Strength and Stamina Yes       Intervention Provide advice, education, support and counseling about physical activity/exercise needs.;Develop an individualized exercise prescription for aerobic and resistive training based on initial evaluation findings, risk stratification, comorbidities and participant's personal goals.       Expected Outcomes Short Term: Perform resistance training exercises routinely during rehab and add in resistance training at home;Short Term: Increase workloads from initial exercise prescription for resistance, speed, and METs.;Long Term: Improve cardiorespiratory fitness, muscular endurance and strength as measured by increased METs and functional capacity ( )  Able to understand and use rate of perceived exertion (RPE) scale Yes       Intervention Provide education and explanation on how to use RPE scale       Expected Outcomes Short Term: Able to use RPE daily in rehab to express subjective intensity level;Long Term:  Able to use RPE to guide intensity level when exercising independently       Able to understand and use Dyspnea scale Yes       Intervention Provide education and explanation on how to use Dyspnea scale       Expected Outcomes Short Term: Able to use Dyspnea scale daily in rehab to express subjective sense of shortness of breath during exertion;Long Term: Able to use Dyspnea scale to guide intensity level when exercising independently       Knowledge and understanding of Target Heart Rate Range (THRR) Yes        Intervention Provide education and explanation of THRR including how the numbers were predicted and where they are located for reference       Expected Outcomes Short Term: Able to state/look up THRR;Long Term: Able to use THRR to govern intensity when exercising independently;Short Term: Able to use daily as guideline for intensity in rehab       Able to check pulse independently Yes       Intervention Provide education and demonstration on how to check pulse in carotid and radial arteries.;Review the importance of being able to check your own pulse for safety during independent exercise       Expected Outcomes Long Term: Able to check pulse independently and accurately;Short Term: Able to explain why pulse checking is important during independent exercise       Understanding of Exercise Prescription Yes       Intervention Provide education, explanation, and written materials on patient's individual exercise prescription       Expected Outcomes Short Term: Able to explain program exercise prescription;Long Term: Able to explain home exercise prescription to exercise independently                Copy of goals given to participant.

## 2022-08-19 NOTE — Progress Notes (Signed)
Pulmonary Individual Treatment Plan  Patient Details  Name: Bridget Deleon MRN: [id number] Date of Birth: 09-08-1940 Referring Provider:   Flowsheet Row Pulmonary Rehab from 08/19/2022 in [facility] Cardiac and Pulmonary Rehab  Referring Provider Vida Rigger MD       Initial Encounter Date:  Flowsheet Row Pulmonary Rehab from 08/19/2022 in [facility] Cardiac and Pulmonary Rehab  Date 08/19/22       Visit Diagnosis: Stage 2 moderate COPD by GOLD classification (HCC)  Patient's Home Medications on Admission:  Current Outpatient Medications:    alendronate (FOSAMAX) 70 MG tablet, Take 70 mg by mouth every 7 (seven) days. On Saturday.  Take with a full glass of water on an empty stomach., Disp: , Rfl:    ALPRAZolam (XANAX) 0.25 MG tablet, Take by mouth., Disp: , Rfl:    benzonatate (TESSALON) 200 MG capsule, Take 200 mg by mouth 3 (three) times daily as needed for cough., Disp: , Rfl:    Calcium Carbonate-Vitamin D (CALCIUM-D) 600-400 MG-UNIT TABS, Take 1 tablet by mouth every morning., Disp: , Rfl:    cholecalciferol (VITAMIN D) 1000 UNITS tablet, Take 1,000 Units by mouth every morning., Disp: , Rfl:    CRANBERRY PO, Take 1,500 mg by mouth every morning., Disp: , Rfl:    dicyclomine (BENTYL) 20 MG tablet, Take 20 mg by mouth 3 (three) times daily as needed for spasms., Disp: , Rfl:    diphenhydramine-acetaminophen (TYLENOL PM) 25-500 MG TABS tablet, Take 1 tablet by mouth at bedtime as needed., Disp: , Rfl:    famotidine (PEPCID) 20 MG tablet, Take by mouth., Disp: , Rfl:    fluticasone (FLONASE) 50 MCG/ACT nasal spray, Place 1 spray into both nostrils daily., Disp: , Rfl:    gentamicin cream (GARAMYCIN) 0.1 %, Apply 1 application topically 3 (three) times daily as needed., Disp: , Rfl:    guaiFENesin-dextromethorphan (ROBITUSSIN DM) 100-10 MG/5ML syrup, Take 5 mLs by mouth every 4 (four) hours as needed for cough., Disp: 118 mL, Rfl: 0   hydrocortisone 2.5 % cream, Apply topically 2 (two)  times daily as needed., Disp: , Rfl:    ibuprofen (ADVIL,MOTRIN) 800 MG tablet, Take 800 mg by mouth 2 (two) times daily as needed. For pain, Disp: , Rfl:    losartan (COZAAR) 100 MG tablet, Take by mouth., Disp: , Rfl:    methocarbamol (ROBAXIN) 500 MG tablet, Take 500 mg by mouth 3 (three) times daily as needed for muscle spasms., Disp: , Rfl:    NIFEdipine (PROCARDIA-XL/NIFEDICAL-XL) 30 MG 24 hr tablet, Take 30 mg by mouth daily., Disp: , Rfl:    ondansetron (ZOFRAN-ODT) 8 MG disintegrating tablet, Take 8 mg by mouth every 8 (eight) hours as needed for nausea or vomiting., Disp: , Rfl:    oxyCODONE-acetaminophen (PERCOCET/ROXICET) 5-325 MG tablet, Take by mouth every 4 (four) hours as needed for severe pain., Disp: , Rfl:    senna-docusate (SENOKOT-S) 8.6-50 MG per tablet, Take 2 tablets by mouth at bedtime as needed. For constipation, Disp: , Rfl:    sertraline (ZOLOFT) 50 MG tablet, Take 1 tablet by mouth daily., Disp: , Rfl:    torsemide (DEMADEX) 20 MG tablet, Take 20 mg by mouth 3 (three) times a week., Disp: , Rfl:    traMADol (ULTRAM) 50 MG tablet, Take by mouth every 6 (six) hours as needed., Disp: , Rfl:    TRELEGY ELLIPTA 100-62.5-25 MCG/ACT AEPB, Inhale 1 puff into the lungs daily., Disp: , Rfl:    triamcinolone cream (KENALOG) 0.1 %,  Apply 1 application topically 2 (two) times daily as needed., Disp: , Rfl:   Past Medical History: Past Medical History:  Diagnosis Date   Arthritis    osteoarthritis   Cancer (HCC) 2010   right lower lobe cancer    Complication of anesthesia 23 yrs ago .    woke up on operating table, also A-fib for short time after Lung surgery   Coronary artery disease 2010   woke up and states she was told  bottom of heart was asleep ... takes diltiazem for that reason .   GERD (gastroesophageal reflux disease)    Ovarian ca (HCC) 1972   Wears dentures    full upper and lower    Tobacco Use: Social History   Tobacco Use  Smoking Status Former    Packs/day: 1.00   Years: 55.00   Additional pack years: 0.00   Total pack years: 55.00   Types: Cigarettes   Quit date: 2009   Years since quitting: 15.3  Smokeless Tobacco Never    Labs: Review Flowsheet        No data to display           Pulmonary Assessment Scores:  Pulmonary Assessment Scores     Row Name 08/19/22 1611         ADL UCSD   ADL Phase Entry     SOB Score total 74     Rest 0     Walk 2     Stairs 5     Bath 3     Dress 3     Shop 5       CAT Score   CAT Score 13       mMRC Score   mMRC Score 3              UCSD: Self-administered rating of dyspnea associated with activities of daily living (ADLs) 6-point scale (0 = "not at all" to 5 = "maximal or unable to do because of breathlessness")  Scoring Scores range from 0 to 120.  Minimally important difference is 5 units  CAT: CAT can identify the health impairment of COPD patients and is better correlated with disease progression.  CAT has a scoring range of zero to 40. The CAT score is classified into four groups of low (less than 10), medium (10 - 20), high (21-30) and very high (31-40) based on the impact level of disease on health status. A CAT score over 10 suggests significant symptoms.  A worsening CAT score could be explained by an exacerbation, poor medication adherence, poor inhaler technique, or progression of COPD or comorbid conditions.  CAT MCID is 2 points  mMRC: mMRC (Modified Medical Research Council) Dyspnea Scale is used to assess the degree of baseline functional disability in patients of respiratory disease due to dyspnea. No minimal important difference is established. A decrease in score of 1 point or greater is considered a positive change.   Pulmonary Function Assessment:  Pulmonary Function Assessment - 08/19/22 1618       Pulmonary Function Tests   FEV1% 53 %    FEV1/FVC Ratio 58.9   Taken from PFT 07/20/22 ([facility])     Post Bronchodilator Spirometry Results    FEV1% 52 %    FEV1/FVC Ratio 55.97             Exercise Target Goals: Exercise Program Goal: Individual exercise prescription set using results from initial 6 min walk test and THRR while considering  patient's activity barriers and safety.   Exercise Prescription Goal: Initial exercise prescription builds to 30-45 minutes a day of aerobic activity, 2-3 days per week.  Home exercise guidelines will be given to patient during program as part of exercise prescription that the participant will acknowledge.  Education: Aerobic Exercise: - Group verbal and visual presentation on the components of exercise prescription. Introduces F.I.T.T principle from ACSM for exercise prescriptions.  Reviews F.I.T.T. principles of aerobic exercise including progression. Written material given at graduation.   Education: Resistance Exercise: - Group verbal and visual presentation on the components of exercise prescription. Introduces F.I.T.T principle from ACSM for exercise prescriptions  Reviews F.I.T.T. principles of resistance exercise including progression. Written material given at graduation.    Education: Exercise & Equipment Safety: - Individual verbal instruction and demonstration of equipment use and safety with use of the equipment. Flowsheet Row Pulmonary Rehab from 08/19/2022 in [facility] Cardiac and Pulmonary Rehab  Education need identified 08/19/22  Date 08/19/22  Educator KW  Instruction Review Code 1- Verbalizes Understanding       Education: Exercise Physiology & General Exercise Guidelines: - Group verbal and written instruction with models to review the exercise physiology of the cardiovascular system and associated critical values. Provides general exercise guidelines with specific guidelines to those with heart or lung disease.    Education: Flexibility, Balance, Mind/Body Relaxation: - Group verbal and visual presentation with interactive activity on the components of exercise  prescription. Introduces F.I.T.T principle from ACSM for exercise prescriptions. Reviews F.I.T.T. principles of flexibility and balance exercise training including progression. Also discusses the mind body connection.  Reviews various relaxation techniques to help reduce and manage stress (i.e. Deep breathing, progressive muscle relaxation, and visualization). Balance handout provided to take home. Written material given at graduation.   Activity Barriers & Risk Stratification:  Activity Barriers & Cardiac Risk Stratification - 08/19/22 1624       Activity Barriers & Cardiac Risk Stratification   Activity Barriers Arthritis;Balance Concerns;Deconditioning;Muscular Weakness;Assistive Device;Shortness of Breath             6 Minute Walk:  6 Minute Walk     Row Name 08/19/22 1624         6 Minute Walk   Phase Initial     Distance 570 feet     Walk Time 6 minutes     # of Rest Breaks 0     MPH 1.07     METS 1.48     RPE 15     Perceived Dyspnea  3     VO2 Peak 5.21     Symptoms Yes (comment)     Comments SOB, general fatigue     Resting HR 72 bpm     Resting BP 122/68     Resting Oxygen Saturation  96 %     Exercise Oxygen Saturation  during 6 min walk 94 %     Max Ex. HR 89 bpm     Max Ex. BP 142/68     2 Minute Post BP 124/68       Interval HR   1 Minute HR 85     2 Minute HR 88     3 Minute HR 87     4 Minute HR 89     5 Minute HR 88     6 Minute HR 87     2 Minute Post HR 70     Interval Heart Rate? Yes  Interval Oxygen   Interval Oxygen? Yes     Baseline Oxygen Saturation % 96 %     1 Minute Oxygen Saturation % 94 %     1 Minute Liters of Oxygen 0 L  RA     2 Minute Oxygen Saturation % 94 %     2 Minute Liters of Oxygen 0 L     3 Minute Oxygen Saturation % 94 %     3 Minute Liters of Oxygen 0 L     4 Minute Oxygen Saturation % 97 %     4 Minute Liters of Oxygen 0 L     5 Minute Oxygen Saturation % 97 %     5 Minute Liters of Oxygen 0 L     6  Minute Oxygen Saturation % 97 %     6 Minute Liters of Oxygen 0 L     2 Minute Post Oxygen Saturation % 98 %     2 Minute Post Liters of Oxygen 0 L             Oxygen Initial Assessment:  Oxygen Initial Assessment - 08/19/22 1611       Home Oxygen   Home Oxygen Device None    Sleep Oxygen Prescription None    Home Exercise Oxygen Prescription None    Home Resting Oxygen Prescription None      Initial 6 min Walk   Oxygen Used None      Program Oxygen Prescription   Program Oxygen Prescription None      Intervention   Short Term Goals To learn and demonstrate proper pursed lip breathing techniques or other breathing techniques. ;To learn and demonstrate proper use of respiratory medications    Long  Term Goals Compliance with respiratory medication;Demonstrates proper use of MDI's;Exhibits proper breathing techniques, such as pursed lip breathing or other method taught during program session             Oxygen Re-Evaluation:   Oxygen Discharge (Final Oxygen Re-Evaluation):   Initial Exercise Prescription:  Initial Exercise Prescription - 08/19/22 1600       Date of Initial Exercise RX and Referring Provider   Date 08/19/22    Referring Provider Vida Rigger MD      Oxygen   Maintain Oxygen Saturation 88% or higher      NuStep   Level 1    SPM 80    Minutes 15    METs 1.4      Biostep-RELP   Level 1    SPM 50    Minutes 15    METs 1.4      Track   Laps 7    Minutes 15    METs 1.44      Prescription Details   Frequency (times per week) 2    Duration Progress to 30 minutes of continuous aerobic without signs/symptoms of physical distress      Intensity   THRR 40-80% of Max Heartrate 99 - 126    Ratings of Perceived Exertion 11-13    Perceived Dyspnea 0-4      Progression   Progression Continue to progress workloads to maintain intensity without signs/symptoms of physical distress.      Resistance Training   Training Prescription Yes     Weight 2 lb    Reps 10-15             Perform Capillary Blood Glucose checks as needed.  Exercise Prescription Changes:   Exercise  Prescription Changes     Row Name 08/19/22 1600             Response to Exercise   Blood Pressure (Admit) 122/68       Blood Pressure (Exercise) 142/68       Blood Pressure (Exit) 124/68       Heart Rate (Admit) 72 bpm       Heart Rate (Exercise) 89 bpm       Heart Rate (Exit) 70 bpm       Oxygen Saturation (Admit) 96 %       Oxygen Saturation (Exercise) 94 %       Oxygen Saturation (Exit) 97 %       Rating of Perceived Exertion (Exercise) 15       Perceived Dyspnea (Exercise) 3       Symptoms SOB, fatigue       Comments walk test results                Exercise Comments:   Exercise Goals and Review:   Exercise Goals     Row Name 08/19/22 1636             Exercise Goals   Increase Physical Activity Yes       Intervention Provide advice, education, support and counseling about physical activity/exercise needs.;Develop an individualized exercise prescription for aerobic and resistive training based on initial evaluation findings, risk stratification, comorbidities and participant's personal goals.       Expected Outcomes Short Term: Attend rehab on a regular basis to increase amount of physical activity.;Long Term: Add in home exercise to make exercise part of routine and to increase amount of physical activity.;Long Term: Exercising regularly at least 3-5 days a week.       Increase Strength and Stamina Yes       Intervention Provide advice, education, support and counseling about physical activity/exercise needs.;Develop an individualized exercise prescription for aerobic and resistive training based on initial evaluation findings, risk stratification, comorbidities and participant's personal goals.       Expected Outcomes Short Term: Perform resistance training exercises routinely during rehab and add in resistance training at  home;Short Term: Increase workloads from initial exercise prescription for resistance, speed, and METs.;Long Term: Improve cardiorespiratory fitness, muscular endurance and strength as measured by increased METs and functional capacity ( )       Able to understand and use rate of perceived exertion (RPE) scale Yes       Intervention Provide education and explanation on how to use RPE scale       Expected Outcomes Short Term: Able to use RPE daily in rehab to express subjective intensity level;Long Term:  Able to use RPE to guide intensity level when exercising independently       Able to understand and use Dyspnea scale Yes       Intervention Provide education and explanation on how to use Dyspnea scale       Expected Outcomes Short Term: Able to use Dyspnea scale daily in rehab to express subjective sense of shortness of breath during exertion;Long Term: Able to use Dyspnea scale to guide intensity level when exercising independently       Knowledge and understanding of Target Heart Rate Range (THRR) Yes       Intervention Provide education and explanation of THRR including how the numbers were predicted and where they are located for reference       Expected Outcomes Short Term: Able to state/look  up THRR;Long Term: Able to use THRR to govern intensity when exercising independently;Short Term: Able to use daily as guideline for intensity in rehab       Able to check pulse independently Yes       Intervention Provide education and demonstration on how to check pulse in carotid and radial arteries.;Review the importance of being able to check your own pulse for safety during independent exercise       Expected Outcomes Long Term: Able to check pulse independently and accurately;Short Term: Able to explain why pulse checking is important during independent exercise       Understanding of Exercise Prescription Yes       Intervention Provide education, explanation, and written materials on patient's  individual exercise prescription       Expected Outcomes Short Term: Able to explain program exercise prescription;Long Term: Able to explain home exercise prescription to exercise independently                Exercise Goals Re-Evaluation :   Discharge Exercise Prescription (Final Exercise Prescription Changes):  Exercise Prescription Changes - 08/19/22 1600       Response to Exercise   Blood Pressure (Admit) 122/68    Blood Pressure (Exercise) 142/68    Blood Pressure (Exit) 124/68    Heart Rate (Admit) 72 bpm    Heart Rate (Exercise) 89 bpm    Heart Rate (Exit) 70 bpm    Oxygen Saturation (Admit) 96 %    Oxygen Saturation (Exercise) 94 %    Oxygen Saturation (Exit) 97 %    Rating of Perceived Exertion (Exercise) 15    Perceived Dyspnea (Exercise) 3    Symptoms SOB, fatigue    Comments walk test results             Nutrition:  Target Goals: Understanding of nutrition guidelines, daily intake of sodium 1500mg , cholesterol 200mg , calories 30% from fat and 7% or less from saturated fats, daily to have 5 or more servings of fruits and vegetables.  Education: All About Nutrition: -Group instruction provided by verbal, written material, interactive activities, discussions, models, and posters to present general guidelines for heart healthy nutrition including fat, fiber, MyPlate, the role of sodium in heart healthy nutrition, utilization of the nutrition label, and utilization of this knowledge for meal planning. Follow up email sent as well. Written material given at graduation. Flowsheet Row Pulmonary Rehab from 08/19/2022 in [facility] Cardiac and Pulmonary Rehab  Education need identified 08/19/22       Biometrics:  Pre Biometrics - 08/19/22 1624       Pre Biometrics   Height 5\' 8"  (1.727 m)    Weight 134 lb (60.8 kg)    Waist Circumference 31 inches    Hip Circumference 38 inches    Waist to Hip Ratio 0.82 %    BMI (Calculated) 20.38    Single Leg Stand 2.5  seconds              Nutrition Therapy Plan and Nutrition Goals:  Nutrition Therapy & Goals - 08/19/22 1616       Intervention Plan   Intervention Prescribe, educate and counsel regarding individualized specific dietary modifications aiming towards targeted core components such as weight, hypertension, lipid management, diabetes, heart failure and other comorbidities.    Expected Outcomes Short Term Goal: Understand basic principles of dietary content, such as calories, fat, sodium, cholesterol and nutrients.;Short Term Goal: A plan has been developed with personal nutrition goals set during dietitian  appointment.;Long Term Goal: Adherence to prescribed nutrition plan.             Nutrition Assessments:  MEDIFICTS Score Key: ?70 Need to make dietary changes  40-70 Heart Healthy Diet ? 40 Therapeutic Level Cholesterol Diet  Flowsheet Row Pulmonary Rehab from 08/19/2022 in [facility] Cardiac and Pulmonary Rehab  Picture Your Plate Total Score on Admission 44      Picture Your Plate Scores: <16 Unhealthy dietary pattern with much room for improvement. 41-50 Dietary pattern unlikely to meet recommendations for good health and room for improvement. 51-60 More healthful dietary pattern, with some room for improvement.  >60 Healthy dietary pattern, although there may be some specific behaviors that could be improved.   Nutrition Goals Re-Evaluation:   Nutrition Goals Discharge (Final Nutrition Goals Re-Evaluation):   Psychosocial: Target Goals: Acknowledge presence or absence of significant depression and/or stress, maximize coping skills, provide positive support system. Participant is able to verbalize types and ability to use techniques and skills needed for reducing stress and depression.   Education: Stress, Anxiety, and Depression - Group verbal and visual presentation to define topics covered.  Reviews how body is impacted by stress, anxiety, and depression.  Also discusses  healthy ways to reduce stress and to treat/manage anxiety and depression.  Written material given at graduation.   Education: Sleep Hygiene -Provides group verbal and written instruction about how sleep can affect your health.  Define sleep hygiene, discuss sleep cycles and impact of sleep habits. Review good sleep hygiene tips.    Initial Review & Psychosocial Screening:  Initial Psych Review & Screening - 08/12/22 1510       Initial Review   Current issues with Current Anxiety/Panic;Current Psychotropic Meds   zoloft  as needed for anxiety     Family Dynamics   Good Support System? Yes   Marianne Sofia in law and grand daughter  great grandson 50     Barriers   Psychosocial barriers to participate in program There are no identifiable barriers or psychosocial needs.      Screening Interventions   Interventions Encouraged to exercise;To provide support and resources with identified psychosocial needs;Provide feedback about the scores to participant    Expected Outcomes Short Term goal: Utilizing psychosocial counselor, staff and physician to assist with identification of specific Stressors or current issues interfering with healing process. Setting desired goal for each stressor or current issue identified.;Long Term Goal: Stressors or current issues are controlled or eliminated.;Short Term goal: Identification and review with participant of any Quality of Life or Depression concerns found by scoring the questionnaire.;Long Term goal: The participant improves quality of Life and PHQ9 Scores as seen by post scores and/or verbalization of changes             Quality of Life Scores:  Scores of 19 and below usually indicate a poorer quality of life in these areas.  A difference of  2-3 points is a clinically meaningful difference.  A difference of 2-3 points in the total score of the Quality of Life Index has been associated with significant improvement in overall quality of life,  self-image, physical symptoms, and general health in studies assessing change in quality of life.  PHQ-9: Review Flowsheet       08/19/2022  Depression screen PHQ 2/9  Decreased Interest 2  Down, Depressed, Hopeless 1  PHQ - 2 Score 3  Altered sleeping 0  Tired, decreased energy 2  Change in appetite 3  Feeling bad  or failure about yourself  3  Trouble concentrating 0  Moving slowly or fidgety/restless 0  Suicidal thoughts 1  PHQ-9 Score 12  Difficult doing work/chores Somewhat difficult   Interpretation of Total Score  Total Score Depression Severity:  1-4 = Minimal depression, 5-9 = Mild depression, 10-14 = Moderate depression, 15-19 = Moderately severe depression, 20-27 = Severe depression   Psychosocial Evaluation and Intervention:  Psychosocial Evaluation - 08/12/22 1634       Psychosocial Evaluation & Interventions   Interventions Encouraged to exercise with the program and follow exercise prescription    Comments Dewayne Hatch is ready to start the program.  She does not have transportation. She was advised  about and given the [facility] information and phone number.  She lives alone and has support from friends. She would like to see improvement with her shortness of breath. She will plan on attending 2 days aweek.    Expected Outcomes STG Dewayne Hatch will be able to attend all scheduled sessions and work on her exercise progression and improve her breathing with ADLs.  LTG Dewayne Hatch will be able to continue to wrok on her exercise progression and improved breathing    Continue Psychosocial Services  Follow up required by staff             Psychosocial Re-Evaluation:   Psychosocial Discharge (Final Psychosocial Re-Evaluation):   Education: Education Goals: Education classes will be provided on a weekly basis, covering required topics. Participant will state understanding/return demonstration of topics presented.  Learning Barriers/Preferences:   General Pulmonary Education  Topics:  Infection Prevention: - Provides verbal and written material to individual with discussion of infection control including proper hand washing and proper equipment cleaning during exercise session. Flowsheet Row Pulmonary Rehab from 08/19/2022 in [facility] Cardiac and Pulmonary Rehab  Education need identified 08/19/22  Date 08/19/22  Educator KW  Instruction Review Code 1- Verbalizes Understanding       Falls Prevention: - Provides verbal and written material to individual with discussion of falls prevention and safety. Flowsheet Row Pulmonary Rehab from 08/12/2022 in [facility] Cardiac and Pulmonary Rehab  Date 08/12/22  Educator SB  Instruction Review Code 1- Verbalizes Understanding       Chronic Lung Disease Review: - Group verbal instruction with posters, models, PowerPoint presentations and videos,  to review new updates, new respiratory medications, new advancements in procedures and treatments. Providing information on websites and "800" numbers for continued self-education. Includes information about supplement oxygen, available portable oxygen systems, continuous and intermittent flow rates, oxygen safety, concentrators, and Medicare reimbursement for oxygen. Explanation of Pulmonary Drugs, including class, frequency, complications, importance of spacers, rinsing mouth after steroid MDI's, and proper cleaning methods for nebulizers. Review of basic lung anatomy and physiology related to function, structure, and complications of lung disease. Review of risk factors. Discussion about methods for diagnosing sleep apnea and types of masks and machines for OSA. Includes a review of the use of types of environmental controls: home humidity, furnaces, filters, dust mite/pet prevention, HEPA vacuums. Discussion about weather changes, air quality and the benefits of nasal washing. Instruction on Warning signs, infection symptoms, calling MD promptly, preventive modes, and value of vaccinations.  Review of effective airway clearance, coughing and/or vibration techniques. Emphasizing that all should Create an Action Plan. Written material given at graduation. Flowsheet Row Pulmonary Rehab from 08/19/2022 in [facility] Cardiac and Pulmonary Rehab  Education need identified 08/19/22       AED/CPR: - Group verbal and written instruction with the use  of models to demonstrate the basic use of the AED with the basic ABC's of resuscitation.    Anatomy and Cardiac Procedures: - Group verbal and visual presentation and models provide information about basic cardiac anatomy and function. Reviews the testing methods done to diagnose heart disease and the outcomes of the test results. Describes the treatment choices: Medical Management, Angioplasty, or Coronary Bypass Surgery for treating various heart conditions including Myocardial Infarction, Angina, Valve Disease, and Cardiac Arrhythmias.  Written material given at graduation.   Medication Safety: - Group verbal and visual instruction to review commonly prescribed medications for heart and lung disease. Reviews the medication, class of the drug, and side effects. Includes the steps to properly store meds and maintain the prescription regimen.  Written material given at graduation.   Other: -Provides group and verbal instruction on various topics (see comments)   Knowledge Questionnaire Score:  Knowledge Questionnaire Score - 08/19/22 1611       Knowledge Questionnaire Score   Pre Score 15/18              Core Components/Risk Factors/Patient Goals at Admission:  Personal Goals and Risk Factors at Admission - 08/19/22 1636       Core Components/Risk Factors/Patient Goals on Admission    Weight Management Yes;Weight Gain   Per pt request, had lost some weight and would like to regain some back   Intervention Weight Management: Develop a combined nutrition and exercise program designed to reach desired caloric intake, while maintaining  appropriate intake of nutrient and fiber, sodium and fats, and appropriate energy expenditure required for the weight goal.;Weight Management: Provide education and appropriate resources to help participant work on and attain dietary goals.    Admit Weight 134 lb (60.8 kg)    Goal Weight: Short Term 137 lb (62.1 kg)    Goal Weight: Long Term 140 lb (63.5 kg)    Expected Outcomes Short Term: Continue to assess and modify interventions until short term weight is achieved;Long Term: Adherence to nutrition and physical activity/exercise program aimed toward attainment of established weight goal;Weight Gain: Understanding of general recommendations for a high calorie, high protein meal plan that promotes weight gain by distributing calorie intake throughout the day with the consumption for 4-5 meals, snacks, and/or supplements;Understanding recommendations for meals to include 15-35% energy as protein, 25-35% energy from fat, 35-60% energy from carbohydrates, less than 200mg  of dietary cholesterol, 20-35 gm of total fiber daily;Understanding of distribution of calorie intake throughout the day with the consumption of 4-5 meals/snacks    Improve shortness of breath with ADL's Yes    Intervention Provide education, individualized exercise plan and daily activity instruction to help decrease symptoms of SOB with activities of daily living.    Expected Outcomes Short Term: Improve cardiorespiratory fitness to achieve a reduction of symptoms when performing ADLs;Long Term: Be able to perform more ADLs without symptoms or delay the onset of symptoms    Increase knowledge of respiratory medications and ability to use respiratory devices properly  Yes    Intervention Provide education and demonstration as needed of appropriate use of medications, inhalers, and oxygen therapy.    Expected Outcomes Short Term: Achieves understanding of medications use. Understands that oxygen is a medication prescribed by physician.  Demonstrates appropriate use of inhaler and oxygen therapy.;Long Term: Maintain appropriate use of medications, inhalers, and oxygen therapy.    Hypertension Yes    Intervention Provide education on lifestyle modifcations including regular physical activity/exercise, weight management, moderate sodium restriction  and increased consumption of fresh fruit, vegetables, and low fat dairy, alcohol moderation, and smoking cessation.;Monitor prescription use compliance.    Expected Outcomes Short Term: Continued assessment and intervention until BP is < 140/61mm HG in hypertensive participants. < 130/72mm HG in hypertensive participants with diabetes, heart failure or chronic kidney disease.;Long Term: Maintenance of blood pressure at goal levels.             Education:Diabetes - Individual verbal and written instruction to review signs/symptoms of diabetes, desired ranges of glucose level fasting, after meals and with exercise. Acknowledge that pre and post exercise glucose checks will be done for 3 sessions at entry of program.   Know Your Numbers and Heart Failure: - Group verbal and visual instruction to discuss disease risk factors for cardiac and pulmonary disease and treatment options.  Reviews associated critical values for Overweight/Obesity, Hypertension, Cholesterol, and Diabetes.  Discusses basics of heart failure: signs/symptoms and treatments.  Introduces Heart Failure Zone chart for action plan for heart failure.  Written material given at graduation.   Core Components/Risk Factors/Patient Goals Review:    Core Components/Risk Factors/Patient Goals at Discharge (Final Review):    ITP Comments:  ITP Comments     Row Name 08/12/22 1633 08/19/22 1608         ITP Comments Virtual orientation call completed today. shehas an appointment on Date: 5/22024  for EP eval and gym Orientation.  Documentation of diagnosis can be found in C HLDate: 07/07/2022 . Completed and gym  orientation. Initial ITP created and sent for review to Dr. Vida Rigger, Medical Director.               Comments: Initial ITP

## 2022-08-25 ENCOUNTER — Encounter: Payer: Medicare PPO | Admitting: *Deleted

## 2022-08-25 DIAGNOSIS — J449 Chronic obstructive pulmonary disease, unspecified: Secondary | ICD-10-CM | POA: Diagnosis not present

## 2022-08-25 NOTE — Progress Notes (Signed)
Daily Session Note  Patient Details  Name: Bridget Deleon MRN: [id number] Date of Birth: Sep 05, 1940 Referring Provider:   Flowsheet Row Pulmonary Rehab from 08/19/2022 in [facility] Cardiac and Pulmonary Rehab  Referring Provider Vida Rigger MD       Encounter Date: 08/25/2022  Check In:  Session Check In - 08/25/22 1534       Check-In   Supervising physician immediately available to respond to emergencies See telemetry face sheet for immediately available ER MD    Location [facility]-Cardiac & Pulmonary Rehab    Staff Present Susann Givens, RN BSN;Joseph Kempton, RCP,RRT,BSRT;Kelly [place], [place], ACSM CEP, Exercise Physiologist    Virtual Visit No    Medication changes reported     No    Fall or balance concerns reported    No    Warm-up and Cool-down Performed on first and last piece of equipment    Resistance Training Performed Yes    VAD Patient? No    PAD/SET Patient? No      Pain Assessment   Currently in Pain? No/denies                Social History   Tobacco Use  Smoking Status Former   Packs/day: 1.00   Years: 55.00   Additional pack years: 0.00   Total pack years: 55.00   Types: Cigarettes   Quit date: 2009   Years since quitting: 15.3  Smokeless Tobacco Never    Goals Met:  Independence with exercise equipment Exercise tolerated well No report of concerns or symptoms today Strength training completed today  Goals Unmet:  Not Applicable  Comments: First full day of exercise!  Patient was oriented to gym and equipment including functions, settings, policies, and procedures.  Patient's individual exercise prescription and treatment plan were reviewed.  All starting workloads were established based on the results of the 6 minute walk test done at initial orientation visit.  The plan for exercise progression was also introduced and progression will be customized based on patient's performance and goals.    Dr. Bethann Punches is Medical Director for [facility]  Cardiac Rehabilitation.  Dr. Vida Rigger is Medical Director for [facility] Pulmonary Rehabilitation.

## 2022-08-30 ENCOUNTER — Encounter: Payer: Medicare PPO | Admitting: *Deleted

## 2022-08-30 DIAGNOSIS — J449 Chronic obstructive pulmonary disease, unspecified: Secondary | ICD-10-CM | POA: Diagnosis not present

## 2022-08-30 NOTE — Progress Notes (Signed)
Daily Session Note  Patient Details  Name: Bridget Deleon MRN: [id number] Date of Birth: 31-May-1940 Referring Provider:   Flowsheet Row Pulmonary Rehab from 08/19/2022 in [facility] Cardiac and Pulmonary Rehab  Referring Provider Vida Rigger MD       Encounter Date: 08/30/2022  Check In:  Session Check In - 08/30/22 1536       Check-In   Supervising physician immediately available to respond to emergencies See telemetry face sheet for immediately available ER MD    Location [facility]-Cardiac & Pulmonary Rehab    Staff Present Susann Givens, RN BSN;Noah Tickle, BS, Exercise Physiologist;Joseph Reino [place], [place]    Virtual Visit No    Medication changes reported     No    Fall or balance concerns reported    No    Warm-up and Cool-down Performed on first and last piece of equipment    Resistance Training Performed Yes    VAD Patient? No    PAD/SET Patient? No      Pain Assessment   Currently in Pain? No/denies                Social History   Tobacco Use  Smoking Status Former   Packs/day: 1.00   Years: 55.00   Additional pack years: 0.00   Total pack years: 55.00   Types: Cigarettes   Quit date: 2009   Years since quitting: 15.3  Smokeless Tobacco Never    Goals Met:  Independence with exercise equipment Exercise tolerated well No report of concerns or symptoms today Strength training completed today  Goals Unmet:  Not Applicable  Comments: Pt able to follow exercise prescription today without complaint.  Will continue to monitor for progression.    Dr. Bethann Punches is Medical Director for [facility] Cardiac Rehabilitation.  Dr. Vida Rigger is Medical Director for [facility] Pulmonary Rehabilitation.

## 2022-09-01 ENCOUNTER — Encounter: Payer: Self-pay | Admitting: *Deleted

## 2022-09-01 ENCOUNTER — Encounter: Payer: Medicare PPO | Admitting: *Deleted

## 2022-09-01 DIAGNOSIS — J449 Chronic obstructive pulmonary disease, unspecified: Secondary | ICD-10-CM

## 2022-09-01 NOTE — Progress Notes (Signed)
Pulmonary Individual Treatment Plan  Patient Details  Name: Bridget Deleon MRN: [id number] Date of Birth: 09-08-1940 Referring Provider:   Flowsheet Row Pulmonary Rehab from 08/19/2022 in [facility] Cardiac and Pulmonary Rehab  Referring Provider Vida Rigger MD       Initial Encounter Date:  Flowsheet Row Pulmonary Rehab from 08/19/2022 in [facility] Cardiac and Pulmonary Rehab  Date 08/19/22       Visit Diagnosis: Stage 2 moderate COPD by GOLD classification (HCC)  Patient's Home Medications on Admission:  Current Outpatient Medications:    alendronate (FOSAMAX) 70 MG tablet, Take 70 mg by mouth every 7 (seven) days. On Saturday.  Take with a full glass of water on an empty stomach., Disp: , Rfl:    ALPRAZolam (XANAX) 0.25 MG tablet, Take by mouth., Disp: , Rfl:    benzonatate (TESSALON) 200 MG capsule, Take 200 mg by mouth 3 (three) times daily as needed for cough., Disp: , Rfl:    Calcium Carbonate-Vitamin D (CALCIUM-D) 600-400 MG-UNIT TABS, Take 1 tablet by mouth every morning., Disp: , Rfl:    cholecalciferol (VITAMIN D) 1000 UNITS tablet, Take 1,000 Units by mouth every morning., Disp: , Rfl:    CRANBERRY PO, Take 1,500 mg by mouth every morning., Disp: , Rfl:    dicyclomine (BENTYL) 20 MG tablet, Take 20 mg by mouth 3 (three) times daily as needed for spasms., Disp: , Rfl:    diphenhydramine-acetaminophen (TYLENOL PM) 25-500 MG TABS tablet, Take 1 tablet by mouth at bedtime as needed., Disp: , Rfl:    famotidine (PEPCID) 20 MG tablet, Take by mouth., Disp: , Rfl:    fluticasone (FLONASE) 50 MCG/ACT nasal spray, Place 1 spray into both nostrils daily., Disp: , Rfl:    gentamicin cream (GARAMYCIN) 0.1 %, Apply 1 application topically 3 (three) times daily as needed., Disp: , Rfl:    guaiFENesin-dextromethorphan (ROBITUSSIN DM) 100-10 MG/5ML syrup, Take 5 mLs by mouth every 4 (four) hours as needed for cough., Disp: 118 mL, Rfl: 0   hydrocortisone 2.5 % cream, Apply topically 2 (two)  times daily as needed., Disp: , Rfl:    ibuprofen (ADVIL,MOTRIN) 800 MG tablet, Take 800 mg by mouth 2 (two) times daily as needed. For pain, Disp: , Rfl:    losartan (COZAAR) 100 MG tablet, Take by mouth., Disp: , Rfl:    methocarbamol (ROBAXIN) 500 MG tablet, Take 500 mg by mouth 3 (three) times daily as needed for muscle spasms., Disp: , Rfl:    NIFEdipine (PROCARDIA-XL/NIFEDICAL-XL) 30 MG 24 hr tablet, Take 30 mg by mouth daily., Disp: , Rfl:    ondansetron (ZOFRAN-ODT) 8 MG disintegrating tablet, Take 8 mg by mouth every 8 (eight) hours as needed for nausea or vomiting., Disp: , Rfl:    oxyCODONE-acetaminophen (PERCOCET/ROXICET) 5-325 MG tablet, Take by mouth every 4 (four) hours as needed for severe pain., Disp: , Rfl:    senna-docusate (SENOKOT-S) 8.6-50 MG per tablet, Take 2 tablets by mouth at bedtime as needed. For constipation, Disp: , Rfl:    sertraline (ZOLOFT) 50 MG tablet, Take 1 tablet by mouth daily., Disp: , Rfl:    torsemide (DEMADEX) 20 MG tablet, Take 20 mg by mouth 3 (three) times a week., Disp: , Rfl:    traMADol (ULTRAM) 50 MG tablet, Take by mouth every 6 (six) hours as needed., Disp: , Rfl:    TRELEGY ELLIPTA 100-62.5-25 MCG/ACT AEPB, Inhale 1 puff into the lungs daily., Disp: , Rfl:    triamcinolone cream (KENALOG) 0.1 %,  Apply 1 application topically 2 (two) times daily as needed., Disp: , Rfl:   Past Medical History: Past Medical History:  Diagnosis Date   Arthritis    osteoarthritis   Cancer (HCC) 2010   right lower lobe cancer    Complication of anesthesia 23 yrs ago .    woke up on operating table, also A-fib for short time after Lung surgery   Coronary artery disease 2010   woke up and states she was told  bottom of heart was asleep ... takes diltiazem for that reason .   GERD (gastroesophageal reflux disease)    Ovarian ca (HCC) 1972   Wears dentures    full upper and lower    Tobacco Use: Social History   Tobacco Use  Smoking Status Former    Packs/day: 1.00   Years: 55.00   Additional pack years: 0.00   Total pack years: 55.00   Types: Cigarettes   Quit date: 2009   Years since quitting: 15.3  Smokeless Tobacco Never    Labs: Review Flowsheet        No data to display           Pulmonary Assessment Scores:  Pulmonary Assessment Scores     Row Name 08/19/22 1611         ADL UCSD   ADL Phase Entry     SOB Score total 74     Rest 0     Walk 2     Stairs 5     Bath 3     Dress 3     Shop 5       CAT Score   CAT Score 13       mMRC Score   mMRC Score 3              UCSD: Self-administered rating of dyspnea associated with activities of daily living (ADLs) 6-point scale (0 = "not at all" to 5 = "maximal or unable to do because of breathlessness")  Scoring Scores range from 0 to 120.  Minimally important difference is 5 units  CAT: CAT can identify the health impairment of COPD patients and is better correlated with disease progression.  CAT has a scoring range of zero to 40. The CAT score is classified into four groups of low (less than 10), medium (10 - 20), high (21-30) and very high (31-40) based on the impact level of disease on health status. A CAT score over 10 suggests significant symptoms.  A worsening CAT score could be explained by an exacerbation, poor medication adherence, poor inhaler technique, or progression of COPD or comorbid conditions.  CAT MCID is 2 points  mMRC: mMRC (Modified Medical Research Council) Dyspnea Scale is used to assess the degree of baseline functional disability in patients of respiratory disease due to dyspnea. No minimal important difference is established. A decrease in score of 1 point or greater is considered a positive change.   Pulmonary Function Assessment:  Pulmonary Function Assessment - 08/19/22 1618       Pulmonary Function Tests   FEV1% 53 %    FEV1/FVC Ratio 58.9   Taken from PFT 07/20/22 ([facility])     Post Bronchodilator Spirometry Results    FEV1% 52 %    FEV1/FVC Ratio 55.97             Exercise Target Goals: Exercise Program Goal: Individual exercise prescription set using results from initial 6 min walk test and THRR while considering  patient's activity barriers and safety.   Exercise Prescription Goal: Initial exercise prescription builds to 30-45 minutes a day of aerobic activity, 2-3 days per week.  Home exercise guidelines will be given to patient during program as part of exercise prescription that the participant will acknowledge.  Education: Aerobic Exercise: - Group verbal and visual presentation on the components of exercise prescription. Introduces F.I.T.T principle from ACSM for exercise prescriptions.  Reviews F.I.T.T. principles of aerobic exercise including progression. Written material given at graduation.   Education: Resistance Exercise: - Group verbal and visual presentation on the components of exercise prescription. Introduces F.I.T.T principle from ACSM for exercise prescriptions  Reviews F.I.T.T. principles of resistance exercise including progression. Written material given at graduation.    Education: Exercise & Equipment Safety: - Individual verbal instruction and demonstration of equipment use and safety with use of the equipment. Flowsheet Row Pulmonary Rehab from 08/25/2022 in [facility] Cardiac and Pulmonary Rehab  Education need identified 08/19/22  Date 08/19/22  Educator KW  Instruction Review Code 1- Verbalizes Understanding       Education: Exercise Physiology & General Exercise Guidelines: - Group verbal and written instruction with models to review the exercise physiology of the cardiovascular system and associated critical values. Provides general exercise guidelines with specific guidelines to those with heart or lung disease.    Education: Flexibility, Balance, Mind/Body Relaxation: - Group verbal and visual presentation with interactive activity on the components of exercise  prescription. Introduces F.I.T.T principle from ACSM for exercise prescriptions. Reviews F.I.T.T. principles of flexibility and balance exercise training including progression. Also discusses the mind body connection.  Reviews various relaxation techniques to help reduce and manage stress (i.e. Deep breathing, progressive muscle relaxation, and visualization). Balance handout provided to take home. Written material given at graduation.   Activity Barriers & Risk Stratification:  Activity Barriers & Cardiac Risk Stratification - 08/19/22 1624       Activity Barriers & Cardiac Risk Stratification   Activity Barriers Arthritis;Balance Concerns;Deconditioning;Muscular Weakness;Assistive Device;Shortness of Breath             6 Minute Walk:  6 Minute Walk     Row Name 08/19/22 1624         6 Minute Walk   Phase Initial     Distance 570 feet     Walk Time 6 minutes     # of Rest Breaks 0     MPH 1.07     METS 1.48     RPE 15     Perceived Dyspnea  3     VO2 Peak 5.21     Symptoms Yes (comment)     Comments SOB, general fatigue     Resting HR 72 bpm     Resting BP 122/68     Resting Oxygen Saturation  96 %     Exercise Oxygen Saturation  during 6 min walk 94 %     Max Ex. HR 89 bpm     Max Ex. BP 142/68     2 Minute Post BP 124/68       Interval HR   1 Minute HR 85     2 Minute HR 88     3 Minute HR 87     4 Minute HR 89     5 Minute HR 88     6 Minute HR 87     2 Minute Post HR 70     Interval Heart Rate? Yes  Interval Oxygen   Interval Oxygen? Yes     Baseline Oxygen Saturation % 96 %     1 Minute Oxygen Saturation % 94 %     1 Minute Liters of Oxygen 0 L  RA     2 Minute Oxygen Saturation % 94 %     2 Minute Liters of Oxygen 0 L     3 Minute Oxygen Saturation % 94 %     3 Minute Liters of Oxygen 0 L     4 Minute Oxygen Saturation % 97 %     4 Minute Liters of Oxygen 0 L     5 Minute Oxygen Saturation % 97 %     5 Minute Liters of Oxygen 0 L     6  Minute Oxygen Saturation % 97 %     6 Minute Liters of Oxygen 0 L     2 Minute Post Oxygen Saturation % 98 %     2 Minute Post Liters of Oxygen 0 L             Oxygen Initial Assessment:  Oxygen Initial Assessment - 08/19/22 1611       Home Oxygen   Home Oxygen Device None    Sleep Oxygen Prescription None    Home Exercise Oxygen Prescription None    Home Resting Oxygen Prescription None      Initial 6 min Walk   Oxygen Used None      Program Oxygen Prescription   Program Oxygen Prescription None      Intervention   Short Term Goals To learn and demonstrate proper pursed lip breathing techniques or other breathing techniques. ;To learn and demonstrate proper use of respiratory medications    Long  Term Goals Compliance with respiratory medication;Demonstrates proper use of MDI's;Exhibits proper breathing techniques, such as pursed lip breathing or other method taught during program session             Oxygen Re-Evaluation:  Oxygen Re-Evaluation     Row Name 08/25/22 1538             Goals/Expected Outcomes   Comments Reviewed PLB technique with pt.  Talked about how it works and it's importance in maintaining their exercise saturations.       Goals/Expected Outcomes Short: Become more profiecient at using PLB.   Long: Become independent at using PLB.                Oxygen Discharge (Final Oxygen Re-Evaluation):  Oxygen Re-Evaluation - 08/25/22 1538       Goals/Expected Outcomes   Comments Reviewed PLB technique with pt.  Talked about how it works and it's importance in maintaining their exercise saturations.    Goals/Expected Outcomes Short: Become more profiecient at using PLB.   Long: Become independent at using PLB.             Initial Exercise Prescription:  Initial Exercise Prescription - 08/19/22 1600       Date of Initial Exercise RX and Referring Provider   Date 08/19/22    Referring Provider Vida Rigger MD      Oxygen    Maintain Oxygen Saturation 88% or higher      NuStep   Level 1    SPM 80    Minutes 15    METs 1.4      Biostep-RELP   Level 1    SPM 50    Minutes 15    METs 1.4  Track   Laps 7    Minutes 15    METs 1.44      Prescription Details   Frequency (times per week) 2    Duration Progress to 30 minutes of continuous aerobic without signs/symptoms of physical distress      Intensity   THRR 40-80% of Max Heartrate 99 - 126    Ratings of Perceived Exertion 11-13    Perceived Dyspnea 0-4      Progression   Progression Continue to progress workloads to maintain intensity without signs/symptoms of physical distress.      Resistance Training   Training Prescription Yes    Weight 2 lb    Reps 10-15             Perform Capillary Blood Glucose checks as needed.  Exercise Prescription Changes:   Exercise Prescription Changes     Row Name 08/19/22 1600 08/26/22 0900           Response to Exercise   Blood Pressure (Admit) 122/68 130/70      Blood Pressure (Exercise) 142/68 130/60      Blood Pressure (Exit) 124/68 110/62      Heart Rate (Admit) 72 bpm 67 bpm      Heart Rate (Exercise) 89 bpm 94 bpm      Heart Rate (Exit) 70 bpm 80 bpm      Oxygen Saturation (Admit) 96 % 97 %      Oxygen Saturation (Exercise) 94 % 95 %      Oxygen Saturation (Exit) 97 % 97 %      Rating of Perceived Exertion (Exercise) 15 15      Perceived Dyspnea (Exercise) 3 0      Symptoms SOB, fatigue none      Comments walk test results 1st full day of exercise      Duration -- Progress to 30 minutes of  aerobic without signs/symptoms of physical distress      Intensity -- THRR unchanged        Progression   Progression -- Continue to progress workloads to maintain intensity without signs/symptoms of physical distress.      Average METs -- 2.07        Resistance Training   Training Prescription -- Yes      Weight -- 2 lb      Reps -- 10-15        Interval Training   Interval  Training -- No        Biostep-RELP   Level -- 1      Minutes -- 15      METs -- 2        Track   Laps -- 21      Minutes -- 15      METs -- 2.14        Oxygen   Maintain Oxygen Saturation -- 88% or higher               Exercise Comments:   Exercise Comments     Row Name 08/25/22 1536           Exercise Comments First full day of exercise!  Patient was oriented to gym and equipment including functions, settings, policies, and procedures.  Patient's individual exercise prescription and treatment plan were reviewed.  All starting workloads were established based on the results of the 6 minute walk test done at initial orientation visit.  The plan for exercise progression was also introduced and progression will  be customized based on patient's performance and goals.                Exercise Goals and Review:   Exercise Goals     Row Name 08/19/22 1636             Exercise Goals   Increase Physical Activity Yes       Intervention Provide advice, education, support and counseling about physical activity/exercise needs.;Develop an individualized exercise prescription for aerobic and resistive training based on initial evaluation findings, risk stratification, comorbidities and participant's personal goals.       Expected Outcomes Short Term: Attend rehab on a regular basis to increase amount of physical activity.;Long Term: Add in home exercise to make exercise part of routine and to increase amount of physical activity.;Long Term: Exercising regularly at least 3-5 days a week.       Increase Strength and Stamina Yes       Intervention Provide advice, education, support and counseling about physical activity/exercise needs.;Develop an individualized exercise prescription for aerobic and resistive training based on initial evaluation findings, risk stratification, comorbidities and participant's personal goals.       Expected Outcomes Short Term: Perform resistance  training exercises routinely during rehab and add in resistance training at home;Short Term: Increase workloads from initial exercise prescription for resistance, speed, and METs.;Long Term: Improve cardiorespiratory fitness, muscular endurance and strength as measured by increased METs and functional capacity ( )       Able to understand and use rate of perceived exertion (RPE) scale Yes       Intervention Provide education and explanation on how to use RPE scale       Expected Outcomes Short Term: Able to use RPE daily in rehab to express subjective intensity level;Long Term:  Able to use RPE to guide intensity level when exercising independently       Able to understand and use Dyspnea scale Yes       Intervention Provide education and explanation on how to use Dyspnea scale       Expected Outcomes Short Term: Able to use Dyspnea scale daily in rehab to express subjective sense of shortness of breath during exertion;Long Term: Able to use Dyspnea scale to guide intensity level when exercising independently       Knowledge and understanding of Target Heart Rate Range (THRR) Yes       Intervention Provide education and explanation of THRR including how the numbers were predicted and where they are located for reference       Expected Outcomes Short Term: Able to state/look up THRR;Long Term: Able to use THRR to govern intensity when exercising independently;Short Term: Able to use daily as guideline for intensity in rehab       Able to check pulse independently Yes       Intervention Provide education and demonstration on how to check pulse in carotid and radial arteries.;Review the importance of being able to check your own pulse for safety during independent exercise       Expected Outcomes Long Term: Able to check pulse independently and accurately;Short Term: Able to explain why pulse checking is important during independent exercise       Understanding of Exercise Prescription Yes        Intervention Provide education, explanation, and written materials on patient's individual exercise prescription       Expected Outcomes Short Term: Able to explain program exercise prescription;Long Term: Able to explain home exercise prescription  to exercise independently                Exercise Goals Re-Evaluation :  Exercise Goals Re-Evaluation     Row Name 08/25/22 1538 08/26/22 0932           Exercise Goal Re-Evaluation   Exercise Goals Review Increase Physical Activity;Able to understand and use rate of perceived exertion (RPE) scale;Knowledge and understanding of Target Heart Rate Range (THRR);Understanding of Exercise Prescription;Increase Strength and Stamina;Able to understand and use Dyspnea scale;Able to check pulse independently Increase Physical Activity;Increase Strength and Stamina;Understanding of Exercise Prescription      Comments Reviewed RPE scale, THR and program prescription with pt today.  Pt voiced understanding and was given a copy of goals to take home. Bridget Deleon did well for her first session here at rehab. She was able to complete 21 laps on the track! Her RPEs are appropriate and O2 saturations stayed above 88%. We will continue to monitor as she progresses in the program.      Expected Outcomes Short: Use RPE daily to regulate intensity.  Long: Follow program prescription in THR. Short: Continue initial exercise prescription Long: Build up overall strength and stamina               Discharge Exercise Prescription (Final Exercise Prescription Changes):  Exercise Prescription Changes - 08/26/22 0900       Response to Exercise   Blood Pressure (Admit) 130/70    Blood Pressure (Exercise) 130/60    Blood Pressure (Exit) 110/62    Heart Rate (Admit) 67 bpm    Heart Rate (Exercise) 94 bpm    Heart Rate (Exit) 80 bpm    Oxygen Saturation (Admit) 97 %    Oxygen Saturation (Exercise) 95 %    Oxygen Saturation (Exit) 97 %    Rating of Perceived Exertion  (Exercise) 15    Perceived Dyspnea (Exercise) 0    Symptoms none    Comments 1st full day of exercise    Duration Progress to 30 minutes of  aerobic without signs/symptoms of physical distress    Intensity THRR unchanged      Progression   Progression Continue to progress workloads to maintain intensity without signs/symptoms of physical distress.    Average METs 2.07      Resistance Training   Training Prescription Yes    Weight 2 lb    Reps 10-15      Interval Training   Interval Training No      Biostep-RELP   Level 1    Minutes 15    METs 2      Track   Laps 21    Minutes 15    METs 2.14      Oxygen   Maintain Oxygen Saturation 88% or higher             Nutrition:  Target Goals: Understanding of nutrition guidelines, daily intake of sodium 1500mg , cholesterol 200mg , calories 30% from fat and 7% or less from saturated fats, daily to have 5 or more servings of fruits and vegetables.  Education: All About Nutrition: -Group instruction provided by verbal, written material, interactive activities, discussions, models, and posters to present general guidelines for heart healthy nutrition including fat, fiber, MyPlate, the role of sodium in heart healthy nutrition, utilization of the nutrition label, and utilization of this knowledge for meal planning. Follow up email sent as well. Written material given at graduation. Flowsheet Row Pulmonary Rehab from 08/25/2022 in [facility] Cardiac and Pulmonary  Rehab  Education need identified 08/19/22       Biometrics:  Pre Biometrics - 08/19/22 1624       Pre Biometrics   Height 5\' 8"  (1.727 m)    Weight 134 lb (60.8 kg)    Waist Circumference 31 inches    Hip Circumference 38 inches    Waist to Hip Ratio 0.82 %    BMI (Calculated) 20.38    Single Leg Stand 2.5 seconds              Nutrition Therapy Plan and Nutrition Goals:  Nutrition Therapy & Goals - 08/19/22 1616       Intervention Plan   Intervention  Prescribe, educate and counsel regarding individualized specific dietary modifications aiming towards targeted core components such as weight, hypertension, lipid management, diabetes, heart failure and other comorbidities.    Expected Outcomes Short Term Goal: Understand basic principles of dietary content, such as calories, fat, sodium, cholesterol and nutrients.;Short Term Goal: A plan has been developed with personal nutrition goals set during dietitian appointment.;Long Term Goal: Adherence to prescribed nutrition plan.             Nutrition Assessments:  MEDIFICTS Score Key: ?70 Need to make dietary changes  40-70 Heart Healthy Diet ? 40 Therapeutic Level Cholesterol Diet  Flowsheet Row Pulmonary Rehab from 08/19/2022 in [facility] Cardiac and Pulmonary Rehab  Picture Your Plate Total Score on Admission 44      Picture Your Plate Scores: <16 Unhealthy dietary pattern with much room for improvement. 41-50 Dietary pattern unlikely to meet recommendations for good health and room for improvement. 51-60 More healthful dietary pattern, with some room for improvement.  >60 Healthy dietary pattern, although there may be some specific behaviors that could be improved.   Nutrition Goals Re-Evaluation:   Nutrition Goals Discharge (Final Nutrition Goals Re-Evaluation):   Psychosocial: Target Goals: Acknowledge presence or absence of significant depression and/or stress, maximize coping skills, provide positive support system. Participant is able to verbalize types and ability to use techniques and skills needed for reducing stress and depression.   Education: Stress, Anxiety, and Depression - Group verbal and visual presentation to define topics covered.  Reviews how body is impacted by stress, anxiety, and depression.  Also discusses healthy ways to reduce stress and to treat/manage anxiety and depression.  Written material given at graduation.   Education: Sleep Hygiene -Provides group  verbal and written instruction about how sleep can affect your health.  Define sleep hygiene, discuss sleep cycles and impact of sleep habits. Review good sleep hygiene tips.    Initial Review & Psychosocial Screening:  Initial Psych Review & Screening - 08/12/22 1510       Initial Review   Current issues with Current Anxiety/Panic;Current Psychotropic Meds   zoloft  as needed for anxiety     Family Dynamics   Good Support System? Yes   Marianne Sofia in law and grand daughter  great grandson 20     Barriers   Psychosocial barriers to participate in program There are no identifiable barriers or psychosocial needs.      Screening Interventions   Interventions Encouraged to exercise;To provide support and resources with identified psychosocial needs;Provide feedback about the scores to participant    Expected Outcomes Short Term goal: Utilizing psychosocial counselor, staff and physician to assist with identification of specific Stressors or current issues interfering with healing process. Setting desired goal for each stressor or current issue identified.;Long Term Goal: Stressors or  current issues are controlled or eliminated.;Short Term goal: Identification and review with participant of any Quality of Life or Depression concerns found by scoring the questionnaire.;Long Term goal: The participant improves quality of Life and PHQ9 Scores as seen by post scores and/or verbalization of changes             Quality of Life Scores:  Scores of 19 and below usually indicate a poorer quality of life in these areas.  A difference of  2-3 points is a clinically meaningful difference.  A difference of 2-3 points in the total score of the Quality of Life Index has been associated with significant improvement in overall quality of life, self-image, physical symptoms, and general health in studies assessing change in quality of life.  PHQ-9: Review Flowsheet       08/19/2022  Depression screen PHQ  2/9  Decreased Interest 2  Down, Depressed, Hopeless 1  PHQ - 2 Score 3  Altered sleeping 0  Tired, decreased energy 2  Change in appetite 3  Feeling bad or failure about yourself  3  Trouble concentrating 0  Moving slowly or fidgety/restless 0  Suicidal thoughts 1  PHQ-9 Score 12  Difficult doing work/chores Somewhat difficult   Interpretation of Total Score  Total Score Depression Severity:  1-4 = Minimal depression, 5-9 = Mild depression, 10-14 = Moderate depression, 15-19 = Moderately severe depression, 20-27 = Severe depression   Psychosocial Evaluation and Intervention:  Psychosocial Evaluation - 08/12/22 1634       Psychosocial Evaluation & Interventions   Interventions Encouraged to exercise with the program and follow exercise prescription    Comments Bridget Deleon is ready to start the program.  She does not have transportation. She was advised  about and given the [facility] information and phone number.  She lives alone and has support from friends. She would like to see improvement with her shortness of breath. She will plan on attending 2 days aweek.    Expected Outcomes STG Bridget Deleon will be able to attend all scheduled sessions and work on her exercise progression and improve her breathing with ADLs.  LTG Bridget Deleon will be able to continue to wrok on her exercise progression and improved breathing    Continue Psychosocial Services  Follow up required by staff             Psychosocial Re-Evaluation:   Psychosocial Discharge (Final Psychosocial Re-Evaluation):   Education: Education Goals: Education classes will be provided on a weekly basis, covering required topics. Participant will state understanding/return demonstration of topics presented.  Learning Barriers/Preferences:   General Pulmonary Education Topics:  Infection Prevention: - Provides verbal and written material to individual with discussion of infection control including proper hand washing and proper equipment  cleaning during exercise session. Flowsheet Row Pulmonary Rehab from 08/25/2022 in [facility] Cardiac and Pulmonary Rehab  Education need identified 08/19/22  Date 08/19/22  Educator KW  Instruction Review Code 1- Verbalizes Understanding       Falls Prevention: - Provides verbal and written material to individual with discussion of falls prevention and safety. Flowsheet Row Pulmonary Rehab from 08/12/2022 in [facility] Cardiac and Pulmonary Rehab  Date 08/12/22  Educator SB  Instruction Review Code 1- Verbalizes Understanding       Chronic Lung Disease Review: - Group verbal instruction with posters, models, PowerPoint presentations and videos,  to review new updates, new respiratory medications, new advancements in procedures and treatments. Providing information on websites and "800" numbers for continued self-education. Includes information  about supplement oxygen, available portable oxygen systems, continuous and intermittent flow rates, oxygen safety, concentrators, and Medicare reimbursement for oxygen. Explanation of Pulmonary Drugs, including class, frequency, complications, importance of spacers, rinsing mouth after steroid MDI's, and proper cleaning methods for nebulizers. Review of basic lung anatomy and physiology related to function, structure, and complications of lung disease. Review of risk factors. Discussion about methods for diagnosing sleep apnea and types of masks and machines for OSA. Includes a review of the use of types of environmental controls: home humidity, furnaces, filters, dust mite/pet prevention, HEPA vacuums. Discussion about weather changes, air quality and the benefits of nasal washing. Instruction on Warning signs, infection symptoms, calling MD promptly, preventive modes, and value of vaccinations. Review of effective airway clearance, coughing and/or vibration techniques. Emphasizing that all should Create an Action Plan. Written material given at graduation. Flowsheet  Row Pulmonary Rehab from 08/25/2022 in [facility] Cardiac and Pulmonary Rehab  Education need identified 08/19/22       AED/CPR: - Group verbal and written instruction with the use of models to demonstrate the basic use of the AED with the basic ABC's of resuscitation.    Anatomy and Cardiac Procedures: - Group verbal and visual presentation and models provide information about basic cardiac anatomy and function. Reviews the testing methods done to diagnose heart disease and the outcomes of the test results. Describes the treatment choices: Medical Management, Angioplasty, or Coronary Bypass Surgery for treating various heart conditions including Myocardial Infarction, Angina, Valve Disease, and Cardiac Arrhythmias.  Written material given at graduation.   Medication Safety: - Group verbal and visual instruction to review commonly prescribed medications for heart and lung disease. Reviews the medication, class of the drug, and side effects. Includes the steps to properly store meds and maintain the prescription regimen.  Written material given at graduation.   Other: -Provides group and verbal instruction on various topics (see comments)   Knowledge Questionnaire Score:  Knowledge Questionnaire Score - 08/19/22 1611       Knowledge Questionnaire Score   Pre Score 15/18              Core Components/Risk Factors/Patient Goals at Admission:  Personal Goals and Risk Factors at Admission - 08/19/22 1636       Core Components/Risk Factors/Patient Goals on Admission    Weight Management Yes;Weight Gain   Per pt request, had lost some weight and would like to regain some back   Intervention Weight Management: Develop a combined nutrition and exercise program designed to reach desired caloric intake, while maintaining appropriate intake of nutrient and fiber, sodium and fats, and appropriate energy expenditure required for the weight goal.;Weight Management: Provide education and appropriate  resources to help participant work on and attain dietary goals.    Admit Weight 134 lb (60.8 kg)    Goal Weight: Short Term 137 lb (62.1 kg)    Goal Weight: Long Term 140 lb (63.5 kg)    Expected Outcomes Short Term: Continue to assess and modify interventions until short term weight is achieved;Long Term: Adherence to nutrition and physical activity/exercise program aimed toward attainment of established weight goal;Weight Gain: Understanding of general recommendations for a high calorie, high protein meal plan that promotes weight gain by distributing calorie intake throughout the day with the consumption for 4-5 meals, snacks, and/or supplements;Understanding recommendations for meals to include 15-35% energy as protein, 25-35% energy from fat, 35-60% energy from carbohydrates, less than 200mg  of dietary cholesterol, 20-35 gm of total fiber daily;Understanding of  distribution of calorie intake throughout the day with the consumption of 4-5 meals/snacks    Improve shortness of breath with ADL's Yes    Intervention Provide education, individualized exercise plan and daily activity instruction to help decrease symptoms of SOB with activities of daily living.    Expected Outcomes Short Term: Improve cardiorespiratory fitness to achieve a reduction of symptoms when performing ADLs;Long Term: Be able to perform more ADLs without symptoms or delay the onset of symptoms    Increase knowledge of respiratory medications and ability to use respiratory devices properly  Yes    Intervention Provide education and demonstration as needed of appropriate use of medications, inhalers, and oxygen therapy.    Expected Outcomes Short Term: Achieves understanding of medications use. Understands that oxygen is a medication prescribed by physician. Demonstrates appropriate use of inhaler and oxygen therapy.;Long Term: Maintain appropriate use of medications, inhalers, and oxygen therapy.    Hypertension Yes    Intervention  Provide education on lifestyle modifcations including regular physical activity/exercise, weight management, moderate sodium restriction and increased consumption of fresh fruit, vegetables, and low fat dairy, alcohol moderation, and smoking cessation.;Monitor prescription use compliance.    Expected Outcomes Short Term: Continued assessment and intervention until BP is < 140/55mm HG in hypertensive participants. < 130/53mm HG in hypertensive participants with diabetes, heart failure or chronic kidney disease.;Long Term: Maintenance of blood pressure at goal levels.             Education:Diabetes - Individual verbal and written instruction to review signs/symptoms of diabetes, desired ranges of glucose level fasting, after meals and with exercise. Acknowledge that pre and post exercise glucose checks will be done for 3 sessions at entry of program.   Know Your Numbers and Heart Failure: - Group verbal and visual instruction to discuss disease risk factors for cardiac and pulmonary disease and treatment options.  Reviews associated critical values for Overweight/Obesity, Hypertension, Cholesterol, and Diabetes.  Discusses basics of heart failure: signs/symptoms and treatments.  Introduces Heart Failure Zone chart for action plan for heart failure.  Written material given at graduation. Flowsheet Row Pulmonary Rehab from 08/25/2022 in [facility] Cardiac and Pulmonary Rehab  Date 08/25/22  Educator MS  Instruction Review Code 1- Verbalizes Understanding       Core Components/Risk Factors/Patient Goals Review:    Core Components/Risk Factors/Patient Goals at Discharge (Final Review):    ITP Comments:  ITP Comments     Row Name 08/12/22 1633 08/19/22 1608 08/25/22 1536 09/01/22 0939     ITP Comments Virtual orientation call completed today. shehas an appointment on Date: 5/22024  for EP eval and gym Orientation.  Documentation of diagnosis can be found in C HLDate: 07/07/2022 . Completed  and gym orientation. Initial ITP created and sent for review to Dr. Vida Rigger, Medical Director. First full day of exercise!  Patient was oriented to gym and equipment including functions, settings, policies, and procedures.  Patient's individual exercise prescription and treatment plan were reviewed.  All starting workloads were established based on the results of the 6 minute walk test done at initial orientation visit.  The plan for exercise progression was also introduced and progression will be customized based on patient's performance and goals. 30 Day review completed. Medical Director ITP review done, changes made as directed, and signed approval by Medical Director.    new to program             Comments:

## 2022-09-01 NOTE — Progress Notes (Signed)
Daily Session Note  Patient Details  Name: Laressa Larcom MRN: [id number] Date of Birth: [birth year] Referring Provider:   Flowsheet Row Pulmonary Rehab from 08/19/2022 in [facility] Cardiac and Pulmonary Rehab  Referring Provider Vida Rigger MD       Encounter Date: 09/01/2022  Check In:  Session Check In - 09/01/22 1558       Check-In   Supervising physician immediately available to respond to emergencies See telemetry face sheet for immediately available ER MD    Location [facility]-Cardiac & Pulmonary Rehab    Staff Present Susann Givens, RN BSN;Laureen Manson Passey, BS, RRT, CPFT;Megan Katrinka Blazing, RN, ADN    Virtual Visit No    Medication changes reported     No    Fall or balance concerns reported    No    Warm-up and Cool-down Performed on first and last piece of equipment    Resistance Training Performed Yes    VAD Patient? No    PAD/SET Patient? No      Pain Assessment   Currently in Pain? No/denies                Social History   Tobacco Use  Smoking Status Former   Packs/day: 1.00   Years: 55.00   Additional pack years: 0.00   Total pack years: 55.00   Types: Cigarettes   Quit date: 2009   Years since quitting: 15.3  Smokeless Tobacco Never    Goals Met:  Independence with exercise equipment Exercise tolerated well No report of concerns or symptoms today Strength training completed today  Goals Unmet:  Not Applicable  Comments: Pt able to follow exercise prescription today without complaint.  Will continue to monitor for progression.    Dr. Bethann Punches is Medical Director for [facility] Cardiac Rehabilitation.  Dr. Vida Rigger is Medical Director for [facility] Pulmonary Rehabilitation.

## 2022-09-08 ENCOUNTER — Encounter: Payer: Medicare PPO | Admitting: *Deleted

## 2022-09-08 DIAGNOSIS — J449 Chronic obstructive pulmonary disease, unspecified: Secondary | ICD-10-CM

## 2022-09-08 NOTE — Progress Notes (Signed)
Daily Session Note  Patient Details  Name: Bridget Deleon MRN: [id number] Date of Birth: 03-Mar-1941 Referring Provider:   Flowsheet Row Pulmonary Rehab from 08/19/2022 in [facility] Cardiac and Pulmonary Rehab  Referring Provider Vida Rigger MD       Encounter Date: 09/08/2022  Check In:  Session Check In - 09/08/22 1617       Check-In   Supervising physician immediately available to respond to emergencies See telemetry face sheet for immediately available ER MD    Location [facility]-Cardiac & Pulmonary Rehab    Staff Present Bess Kinds RN, Atilano Median, RN, Bobetta Lime, RCP,RRT,BSRT    Virtual Visit No    Medication changes reported     No    Fall or balance concerns reported    No    Warm-up and Cool-down Performed on first and last piece of equipment    Resistance Training Performed Yes    VAD Patient? No    PAD/SET Patient? No      Pain Assessment   Currently in Pain? No/denies                Social History   Tobacco Use  Smoking Status Former   Packs/day: 1.00   Years: 55.00   Additional pack years: 0.00   Total pack years: 55.00   Types: Cigarettes   Quit date: 2009   Years since quitting: 15.3  Smokeless Tobacco Never    Goals Met:  Independence with exercise equipment Exercise tolerated well No report of concerns or symptoms today Strength training completed today  Goals Unmet:  Not Applicable  Comments: Pt able to follow exercise prescription today without complaint.  Will continue to monitor for progression.    Dr. Bethann Punches is Medical Director for [facility] Cardiac Rehabilitation.  Dr. Vida Rigger is Medical Director for [facility] Pulmonary Rehabilitation.

## 2022-09-15 ENCOUNTER — Encounter: Payer: Medicare PPO | Admitting: *Deleted

## 2022-09-15 DIAGNOSIS — J449 Chronic obstructive pulmonary disease, unspecified: Secondary | ICD-10-CM | POA: Diagnosis not present

## 2022-09-15 NOTE — Progress Notes (Signed)
Daily Session Note  Patient Details  Name: Bridget Deleon MRN: [id number] Date of Birth: 05-08-1940 Referring Provider:   Flowsheet Row Pulmonary Rehab from 08/19/2022 in [facility] Cardiac and Pulmonary Rehab  Referring Provider Vida Rigger MD       Encounter Date: 09/15/2022  Check In:  Session Check In - 09/15/22 1538       Check-In   Supervising physician immediately available to respond to emergencies See telemetry face sheet for immediately available ER MD    Location [facility]-Cardiac & Pulmonary Rehab    Staff Present Susann Givens, RN BSN;Joseph Reino [place], Guinevere Ferrari, RN, [place]    Virtual Visit No    Medication changes reported     No    Fall or balance concerns reported    No    Warm-up and Cool-down Performed on first and last piece of equipment    Resistance Training Performed Yes    VAD Patient? No    PAD/SET Patient? No      Pain Assessment   Currently in Pain? No/denies                Social History   Tobacco Use  Smoking Status Former   Packs/day: 1.00   Years: 55.00   Additional pack years: 0.00   Total pack years: 55.00   Types: Cigarettes   Quit date: 2009   Years since quitting: 15.4  Smokeless Tobacco Never    Goals Met:  Independence with exercise equipment Exercise tolerated well No report of concerns or symptoms today Strength training completed today  Goals Unmet:  Not Applicable  Comments: Pt able to follow exercise prescription today without complaint.  Will continue to monitor for progression.    Dr. Bethann Punches is Medical Director for [facility] Cardiac Rehabilitation.  Dr. Vida Rigger is Medical Director for [facility] Pulmonary Rehabilitation.

## 2022-09-20 ENCOUNTER — Encounter: Payer: Medicare PPO | Attending: Pulmonary Disease | Admitting: *Deleted

## 2022-09-20 DIAGNOSIS — J449 Chronic obstructive pulmonary disease, unspecified: Secondary | ICD-10-CM | POA: Insufficient documentation

## 2022-09-20 NOTE — Progress Notes (Signed)
Daily Session Note  Patient Details  Name: Bridget Deleon MRN: [id number] Date of Birth: November 06, 1940 Referring Provider:   Flowsheet Row Pulmonary Rehab from 08/19/2022 in [facility] Cardiac and Pulmonary Rehab  Referring Provider Vida Rigger MD       Encounter Date: 09/20/2022  Check In:  Session Check In - 09/20/22 1557       Check-In   Supervising physician immediately available to respond to emergencies See telemetry face sheet for immediately available ER MD    Location [facility]-Cardiac & Pulmonary Rehab    Staff Present Cyndia Diver, RN, BSN, Cammie Sickle, [place], [place], [place], CCRP, [place], RN, [place]    Virtual Visit No    Medication changes reported     No    Fall or balance concerns reported    Yes    Comments Pt states that she fell on the grass, hurrying on the way in today. She states that she landed on her cheek, under her right eye-slight swelling noticed, no bruising at this time. She has a small tear on top of her right hand-dressing placed. She defers further examination or xray. A&O x3, appropriate and no other complaints.    Tobacco Cessation No Change    Warm-up and Cool-down Performed on first and last piece of equipment    Resistance Training Performed Yes    VAD Patient? No    PAD/SET Patient? No      Pain Assessment   Currently in Pain? No/denies                Social History   Tobacco Use  Smoking Status Former   Packs/day: 1.00   Years: 55.00   Additional pack years: 0.00   Total pack years: 55.00   Types: Cigarettes   Quit date: 2009   Years since quitting: 15.4  Smokeless Tobacco Never    Goals Met:  Independence with exercise equipment Exercise tolerated well No report of concerns or symptoms today  Goals Unmet:  Not Applicable  Comments: Pt able to follow exercise prescription today without complaint.  Will continue to monitor for progression.    Dr. Bethann Punches is Medical Director for [facility]  Cardiac Rehabilitation.  Dr. Vida Rigger is Medical Director for [facility] Pulmonary Rehabilitation.

## 2022-09-27 ENCOUNTER — Encounter: Payer: Medicare PPO | Admitting: *Deleted

## 2022-09-27 DIAGNOSIS — J449 Chronic obstructive pulmonary disease, unspecified: Secondary | ICD-10-CM

## 2022-09-27 NOTE — Progress Notes (Signed)
Daily Session Note  Patient Details  Name: Bridget Deleon MRN: [id number] Date of Birth: Nov 08, 1940 Referring Provider:   Flowsheet Row Pulmonary Rehab from 08/19/2022 in [facility] Cardiac and Pulmonary Rehab  Referring Provider Vida Rigger MD       Encounter Date: 09/27/2022  Check In:  Session Check In - 09/27/22 1549       Check-In   Supervising physician immediately available to respond to emergencies See telemetry face sheet for immediately available ER MD    Location [facility]-Cardiac & Pulmonary Rehab    Staff Present Susann Givens, RN BSN;Joseph [place], RCP,RRT,BSRT;Laureen [place], [place], RRT, CPFT    Virtual Visit No    Medication changes reported     No    Fall or balance concerns reported    No    Warm-up and Cool-down Performed on first and last piece of equipment    Resistance Training Performed Yes    VAD Patient? No    PAD/SET Patient? No      Pain Assessment   Currently in Pain? No/denies                Social History   Tobacco Use  Smoking Status Former   Packs/day: 1.00   Years: 55.00   Additional pack years: 0.00   Total pack years: 55.00   Types: Cigarettes   Quit date: 2009   Years since quitting: 15.4  Smokeless Tobacco Never    Goals Met:  Independence with exercise equipment Exercise tolerated well No report of concerns or symptoms today Strength training completed today  Goals Unmet:  Not Applicable  Comments: Pt able to follow exercise prescription today without complaint.  Will continue to monitor for progression.    Dr. Bethann Punches is Medical Director for [facility] Cardiac Rehabilitation.  Dr. Vida Rigger is Medical Director for [facility] Pulmonary Rehabilitation.

## 2022-09-29 ENCOUNTER — Encounter: Payer: Self-pay | Admitting: *Deleted

## 2022-09-29 ENCOUNTER — Encounter: Payer: Medicare PPO | Admitting: *Deleted

## 2022-09-29 DIAGNOSIS — J449 Chronic obstructive pulmonary disease, unspecified: Secondary | ICD-10-CM

## 2022-09-29 NOTE — Progress Notes (Signed)
Daily Session Note  Patient Details  Name: Bridget Deleon MRN: [id number] Date of Birth: 1941-03-25 Referring Provider:   Flowsheet Row Pulmonary Rehab from 08/19/2022 in [facility] Cardiac and Pulmonary Rehab  Referring Provider Vida Rigger MD       Encounter Date: 09/29/2022  Check In:  Session Check In - 09/29/22 1537       Check-In   Supervising physician immediately available to respond to emergencies See telemetry face sheet for immediately available ER MD    Location [facility]-Cardiac & Pulmonary Rehab    Staff Present Susann Givens, RN BSN;Laureen Manson Passey, BS, RRT, CPFT;Joseph Guys, [place]    Virtual Visit No    Medication changes reported     No    Fall or balance concerns reported    No    Warm-up and Cool-down Performed on first and last piece of equipment    Resistance Training Performed Yes    VAD Patient? No    PAD/SET Patient? No      Pain Assessment   Currently in Pain? No/denies                Social History   Tobacco Use  Smoking Status Former   Packs/day: 1.00   Years: 55.00   Additional pack years: 0.00   Total pack years: 55.00   Types: Cigarettes   Quit date: 2009   Years since quitting: 15.4  Smokeless Tobacco Never    Goals Met:  Independence with exercise equipment Exercise tolerated well No report of concerns or symptoms today Strength training completed today  Goals Unmet:  Not Applicable  Comments: Pt able to follow exercise prescription today without complaint.  Will continue to monitor for progression.    Dr. Bethann Punches is Medical Director for [facility] Cardiac Rehabilitation.  Dr. Vida Rigger is Medical Director for [facility] Pulmonary Rehabilitation.

## 2022-09-29 NOTE — Progress Notes (Signed)
Pulmonary Individual Treatment Plan  Patient Details  Name: Bridget Deleon MRN: [id number] Date of Birth: 09-08-1940 Referring Provider:   Flowsheet Row Pulmonary Rehab from 08/19/2022 in [facility] Cardiac and Pulmonary Rehab  Referring Provider Bridget Rigger MD       Initial Encounter Date:  Flowsheet Row Pulmonary Rehab from 08/19/2022 in [facility] Cardiac and Pulmonary Rehab  Date 08/19/22       Visit Diagnosis: Stage 2 moderate COPD by GOLD classification (HCC)  Patient's Home Medications on Admission:  Current Outpatient Medications:    alendronate (FOSAMAX) 70 MG tablet, Take 70 mg by mouth every 7 (seven) days. On Saturday.  Take with a full glass of water on an empty stomach., Disp: , Rfl:    ALPRAZolam (XANAX) 0.25 MG tablet, Take by mouth., Disp: , Rfl:    benzonatate (TESSALON) 200 MG capsule, Take 200 mg by mouth 3 (three) times daily as needed for cough., Disp: , Rfl:    Calcium Carbonate-Vitamin D (CALCIUM-D) 600-400 MG-UNIT TABS, Take 1 tablet by mouth every morning., Disp: , Rfl:    cholecalciferol (VITAMIN D) 1000 UNITS tablet, Take 1,000 Units by mouth every morning., Disp: , Rfl:    CRANBERRY PO, Take 1,500 mg by mouth every morning., Disp: , Rfl:    dicyclomine (BENTYL) 20 MG tablet, Take 20 mg by mouth 3 (three) times daily as needed for spasms., Disp: , Rfl:    diphenhydramine-acetaminophen (TYLENOL PM) 25-500 MG TABS tablet, Take 1 tablet by mouth at bedtime as needed., Disp: , Rfl:    famotidine (PEPCID) 20 MG tablet, Take by mouth., Disp: , Rfl:    fluticasone (FLONASE) 50 MCG/ACT nasal spray, Place 1 spray into both nostrils daily., Disp: , Rfl:    gentamicin cream (GARAMYCIN) 0.1 %, Apply 1 application topically 3 (three) times daily as needed., Disp: , Rfl:    guaiFENesin-dextromethorphan (ROBITUSSIN DM) 100-10 MG/5ML syrup, Take 5 mLs by mouth every 4 (four) hours as needed for cough., Disp: 118 mL, Rfl: 0   hydrocortisone 2.5 % cream, Apply topically 2 (two)  times daily as needed., Disp: , Rfl:    ibuprofen (ADVIL,MOTRIN) 800 MG tablet, Take 800 mg by mouth 2 (two) times daily as needed. For pain, Disp: , Rfl:    losartan (COZAAR) 100 MG tablet, Take by mouth., Disp: , Rfl:    methocarbamol (ROBAXIN) 500 MG tablet, Take 500 mg by mouth 3 (three) times daily as needed for muscle spasms., Disp: , Rfl:    NIFEdipine (PROCARDIA-XL/NIFEDICAL-XL) 30 MG 24 hr tablet, Take 30 mg by mouth daily., Disp: , Rfl:    ondansetron (ZOFRAN-ODT) 8 MG disintegrating tablet, Take 8 mg by mouth every 8 (eight) hours as needed for nausea or vomiting., Disp: , Rfl:    oxyCODONE-acetaminophen (PERCOCET/ROXICET) 5-325 MG tablet, Take by mouth every 4 (four) hours as needed for severe pain., Disp: , Rfl:    senna-docusate (SENOKOT-S) 8.6-50 MG per tablet, Take 2 tablets by mouth at bedtime as needed. For constipation, Disp: , Rfl:    sertraline (ZOLOFT) 50 MG tablet, Take 1 tablet by mouth daily., Disp: , Rfl:    torsemide (DEMADEX) 20 MG tablet, Take 20 mg by mouth 3 (three) times a week., Disp: , Rfl:    traMADol (ULTRAM) 50 MG tablet, Take by mouth every 6 (six) hours as needed., Disp: , Rfl:    TRELEGY ELLIPTA 100-62.5-25 MCG/ACT AEPB, Inhale 1 puff into the lungs daily., Disp: , Rfl:    triamcinolone cream (KENALOG) 0.1 %,  Apply 1 application topically 2 (two) times daily as needed., Disp: , Rfl:   Past Medical History: Past Medical History:  Diagnosis Date   Arthritis    osteoarthritis   Cancer (HCC) 2010   right lower lobe cancer    Complication of anesthesia 23 yrs ago .    woke up on operating table, also A-fib for short time after Lung surgery   Coronary artery disease 2010   woke up and states she was told  bottom of heart was asleep ... takes diltiazem for that reason .   GERD (gastroesophageal reflux disease)    Ovarian ca (HCC) 1972   Wears dentures    full upper and lower    Tobacco Use: Social History   Tobacco Use  Smoking Status Former    Packs/day: 1.00   Years: 55.00   Additional pack years: 0.00   Total pack years: 55.00   Types: Cigarettes   Quit date: 2009   Years since quitting: 15.4  Smokeless Tobacco Never    Labs: Review Flowsheet        No data to display           Pulmonary Assessment Scores:  Pulmonary Assessment Scores     Row Name 08/19/22 1611         ADL UCSD   ADL Phase Entry     SOB Score total 74     Rest 0     Walk 2     Stairs 5     Bath 3     Dress 3     Shop 5       CAT Score   CAT Score 13       mMRC Score   mMRC Score 3              UCSD: Self-administered rating of dyspnea associated with activities of daily living (ADLs) 6-point scale (0 = "not at all" to 5 = "maximal or unable to do because of breathlessness")  Scoring Scores range from 0 to 120.  Minimally important difference is 5 units  CAT: CAT can identify the health impairment of COPD patients and is better correlated with disease progression.  CAT has a scoring range of zero to 40. The CAT score is classified into four groups of low (less than 10), medium (10 - 20), high (21-30) and very high (31-40) based on the impact level of disease on health status. A CAT score over 10 suggests significant symptoms.  A worsening CAT score could be explained by an exacerbation, poor medication adherence, poor inhaler technique, or progression of COPD or comorbid conditions.  CAT MCID is 2 points  mMRC: mMRC (Modified Medical Research Council) Dyspnea Scale is used to assess the degree of baseline functional disability in patients of respiratory disease due to dyspnea. No minimal important difference is established. A decrease in score of 1 point or greater is considered a positive change.   Pulmonary Function Assessment:  Pulmonary Function Assessment - 08/19/22 1618       Pulmonary Function Tests   FEV1% 53 %    FEV1/FVC Ratio 58.9   Taken from PFT 07/20/22 ([facility])     Post Bronchodilator Spirometry Results    FEV1% 52 %    FEV1/FVC Ratio 55.97             Exercise Target Goals: Exercise Program Goal: Individual exercise prescription set using results from initial 6 min walk test and THRR while considering  patient's activity barriers and safety.   Exercise Prescription Goal: Initial exercise prescription builds to 30-45 minutes a day of aerobic activity, 2-3 days per week.  Home exercise guidelines will be given to patient during program as part of exercise prescription that the participant will acknowledge.  Education: Aerobic Exercise: - Group verbal and visual presentation on the components of exercise prescription. Introduces F.I.T.T principle from ACSM for exercise prescriptions.  Reviews F.I.T.T. principles of aerobic exercise including progression. Written material given at graduation.   Education: Resistance Exercise: - Group verbal and visual presentation on the components of exercise prescription. Introduces F.I.T.T principle from ACSM for exercise prescriptions  Reviews F.I.T.T. principles of resistance exercise including progression. Written material given at graduation.    Education: Exercise & Equipment Safety: - Individual verbal instruction and demonstration of equipment use and safety with use of the equipment. Flowsheet Row Pulmonary Rehab from 09/15/2022 in [facility] Cardiac and Pulmonary Rehab  Education need identified 08/19/22  Date 08/19/22  Educator KW  Instruction Review Code 1- Verbalizes Understanding       Education: Exercise Physiology & General Exercise Guidelines: - Group verbal and written instruction with models to review the exercise physiology of the cardiovascular system and associated critical values. Provides general exercise guidelines with specific guidelines to those with heart or lung disease.  Flowsheet Row Pulmonary Rehab from 09/15/2022 in [facility] Cardiac and Pulmonary Rehab  Date 09/15/22  Educator [facility]  Instruction Review Code 1- Verbalizes  Understanding       Education: Flexibility, Balance, Mind/Body Relaxation: - Group verbal and visual presentation with interactive activity on the components of exercise prescription. Introduces F.I.T.T principle from ACSM for exercise prescriptions. Reviews F.I.T.T. principles of flexibility and balance exercise training including progression. Also discusses the mind body connection.  Reviews various relaxation techniques to help reduce and manage stress (i.e. Deep breathing, progressive muscle relaxation, and visualization). Balance handout provided to take home. Written material given at graduation.   Activity Barriers & Risk Stratification:  Activity Barriers & Cardiac Risk Stratification - 08/19/22 1624       Activity Barriers & Cardiac Risk Stratification   Activity Barriers Arthritis;Balance Concerns;Deconditioning;Muscular Weakness;Assistive Device;Shortness of Breath             6 Minute Walk:  6 Minute Walk     Row Name 08/19/22 1624         6 Minute Walk   Phase Initial     Distance 570 feet     Walk Time 6 minutes     # of Rest Breaks 0     MPH 1.07     METS 1.48     RPE 15     Perceived Dyspnea  3     VO2 Peak 5.21     Symptoms Yes (comment)     Comments SOB, general fatigue     Resting HR 72 bpm     Resting BP 122/68     Resting Oxygen Saturation  96 %     Exercise Oxygen Saturation  during 6 min walk 94 %     Max Ex. HR 89 bpm     Max Ex. BP 142/68     2 Minute Post BP 124/68       Interval HR   1 Minute HR 85     2 Minute HR 88     3 Minute HR 87     4 Minute HR 89     5 Minute HR 88  6 Minute HR 87     2 Minute Post HR 70     Interval Heart Rate? Yes       Interval Oxygen   Interval Oxygen? Yes     Baseline Oxygen Saturation % 96 %     1 Minute Oxygen Saturation % 94 %     1 Minute Liters of Oxygen 0 L  RA     2 Minute Oxygen Saturation % 94 %     2 Minute Liters of Oxygen 0 L     3 Minute Oxygen Saturation % 94 %     3 Minute  Liters of Oxygen 0 L     4 Minute Oxygen Saturation % 97 %     4 Minute Liters of Oxygen 0 L     5 Minute Oxygen Saturation % 97 %     5 Minute Liters of Oxygen 0 L     6 Minute Oxygen Saturation % 97 %     6 Minute Liters of Oxygen 0 L     2 Minute Post Oxygen Saturation % 98 %     2 Minute Post Liters of Oxygen 0 L             Oxygen Initial Assessment:  Oxygen Initial Assessment - 08/19/22 1611       Home Oxygen   Home Oxygen Device None    Sleep Oxygen Prescription None    Home Exercise Oxygen Prescription None    Home Resting Oxygen Prescription None      Initial 6 min Walk   Oxygen Used None      Program Oxygen Prescription   Program Oxygen Prescription None      Intervention   Short Term Goals To learn and demonstrate proper pursed lip breathing techniques or other breathing techniques. ;To learn and demonstrate proper use of respiratory medications    Long  Term Goals Compliance with respiratory medication;Demonstrates proper use of MDI's;Exhibits proper breathing techniques, such as pursed lip breathing or other method taught during program session             Oxygen Re-Evaluation:  Oxygen Re-Evaluation     Row Name 08/25/22 1538 09/15/22 1609           Program Oxygen Prescription   Program Oxygen Prescription -- None        Home Oxygen   Home Oxygen Device -- None      Sleep Oxygen Prescription -- None      Home Exercise Oxygen Prescription -- None      Home Resting Oxygen Prescription -- None        Goals/Expected Outcomes   Short Term Goals -- To learn and demonstrate proper use of respiratory medications;To learn and understand importance of maintaining oxygen saturations>88%      Long  Term Goals -- Maintenance of O2 saturations>88%;Demonstrates proper use of MDI's;Verbalizes importance of monitoring SPO2 with pulse oximeter and return demonstration      Comments Reviewed PLB technique with pt.  Talked about how it works and it's importance  in maintaining their exercise saturations. She does not have a pulse oximeter to check her/his oxygen saturation at home. Informed her where to get one and explained why it is important to have one. Reviewed that oxygen saturations should be 88 percent and above.      Goals/Expected Outcomes Short: Become more profiecient at using PLB.   Long: Become independent at using PLB. Short: monitor oxygen at home  with exertion. Long: maintain oxygen saturations above 88 percent independently.               Oxygen Discharge (Final Oxygen Re-Evaluation):  Oxygen Re-Evaluation - 09/15/22 1609       Program Oxygen Prescription   Program Oxygen Prescription None      Home Oxygen   Home Oxygen Device None    Sleep Oxygen Prescription None    Home Exercise Oxygen Prescription None    Home Resting Oxygen Prescription None      Goals/Expected Outcomes   Short Term Goals To learn and demonstrate proper use of respiratory medications;To learn and understand importance of maintaining oxygen saturations>88%    Long  Term Goals Maintenance of O2 saturations>88%;Demonstrates proper use of MDI's;Verbalizes importance of monitoring SPO2 with pulse oximeter and return demonstration    Comments She does not have a pulse oximeter to check her/his oxygen saturation at home. Informed her where to get one and explained why it is important to have one. Reviewed that oxygen saturations should be 88 percent and above.    Goals/Expected Outcomes Short: monitor oxygen at home with exertion. Long: maintain oxygen saturations above 88 percent independently.             Initial Exercise Prescription:  Initial Exercise Prescription - 08/19/22 1600       Date of Initial Exercise RX and Referring Provider   Date 08/19/22    Referring Provider Bridget Rigger MD      Oxygen   Maintain Oxygen Saturation 88% or higher      NuStep   Level 1    SPM 80    Minutes 15    METs 1.4      Biostep-RELP   Level 1     SPM 50    Minutes 15    METs 1.4      Track   Laps 7    Minutes 15    METs 1.44      Prescription Details   Frequency (times per week) 2    Duration Progress to 30 minutes of continuous aerobic without signs/symptoms of physical distress      Intensity   THRR 40-80% of Max Heartrate 99 - 126    Ratings of Perceived Exertion 11-13    Perceived Dyspnea 0-4      Progression   Progression Continue to progress workloads to maintain intensity without signs/symptoms of physical distress.      Resistance Training   Training Prescription Yes    Weight 2 lb    Reps 10-15             Perform Capillary Blood Glucose checks as needed.  Exercise Prescription Changes:   Exercise Prescription Changes     Row Name 08/19/22 1600 08/26/22 0900 09/06/22 1300 09/21/22 0700       Response to Exercise   Blood Pressure (Admit) 122/68 130/70 110/60 118/58    Blood Pressure (Exercise) 142/68 130/60 138/68 110/54    Blood Pressure (Exit) 124/68 110/62 118/58 110/60    Heart Rate (Admit) 72 bpm 67 bpm 73 bpm 70 bpm    Heart Rate (Exercise) 89 bpm 94 bpm 85 bpm 94 bpm    Heart Rate (Exit) 70 bpm 80 bpm 67 bpm 70 bpm    Oxygen Saturation (Admit) 96 % 97 % 98 % 96 %    Oxygen Saturation (Exercise) 94 % 95 % 88 % 96 %    Oxygen Saturation (Exit) 97 % 97 %  98 % 97 %    Rating of Perceived Exertion (Exercise) 15 15 13 13     Perceived Dyspnea (Exercise) 3 0 3 2    Symptoms SOB, fatigue none SOB SOB    Comments walk test results 1st full day of exercise -- --    Duration -- Progress to 30 minutes of  aerobic without signs/symptoms of physical distress Progress to 30 minutes of  aerobic without signs/symptoms of physical distress Progress to 30 minutes of  aerobic without signs/symptoms of physical distress    Intensity -- THRR unchanged THRR unchanged THRR unchanged      Progression   Progression -- Continue to progress workloads to maintain intensity without signs/symptoms of physical  distress. Continue to progress workloads to maintain intensity without signs/symptoms of physical distress. Continue to progress workloads to maintain intensity without signs/symptoms of physical distress.    Average METs -- 2.07 2.09 2.31      Resistance Training   Training Prescription -- Yes Yes Yes    Weight -- 2 lb 2 lb 2 lb    Reps -- 10-15 10-15 10-15      Interval Training   Interval Training -- No No No      Recumbant Bike   Level -- -- 1 --    Watts -- -- 11 --    Minutes -- -- 15 --      NuStep   Level -- -- 1 1    Minutes -- -- 15 15      Biostep-RELP   Level -- 1 -- 1    Minutes -- 15 -- 15    METs -- 2 -- 2      Track   Laps -- 21 24 30     Minutes -- 15 15 15     METs -- 2.14 2.31 2.63      Oxygen   Maintain Oxygen Saturation -- 88% or higher 88% or higher 88% or higher             Exercise Comments:   Exercise Comments     Row Name 08/25/22 1536           Exercise Comments First full day of exercise!  Patient was oriented to gym and equipment including functions, settings, policies, and procedures.  Patient's individual exercise prescription and treatment plan were reviewed.  All starting workloads were established based on the results of the 6 minute walk test done at initial orientation visit.  The plan for exercise progression was also introduced and progression will be customized based on patient's performance and goals.                Exercise Goals and Review:   Exercise Goals     Row Name 08/19/22 1636             Exercise Goals   Increase Physical Activity Yes       Intervention Provide advice, education, support and counseling about physical activity/exercise needs.;Develop an individualized exercise prescription for aerobic and resistive training based on initial evaluation findings, risk stratification, comorbidities and participant's personal goals.       Expected Outcomes Short Term: Attend rehab on a regular basis to  increase amount of physical activity.;Long Term: Add in home exercise to make exercise part of routine and to increase amount of physical activity.;Long Term: Exercising regularly at least 3-5 days a week.       Increase Strength and Stamina Yes  Intervention Provide advice, education, support and counseling about physical activity/exercise needs.;Develop an individualized exercise prescription for aerobic and resistive training based on initial evaluation findings, risk stratification, comorbidities and participant's personal goals.       Expected Outcomes Short Term: Perform resistance training exercises routinely during rehab and add in resistance training at home;Short Term: Increase workloads from initial exercise prescription for resistance, speed, and METs.;Long Term: Improve cardiorespiratory fitness, muscular endurance and strength as measured by increased METs and functional capacity ( )       Able to understand and use rate of perceived exertion (RPE) scale Yes       Intervention Provide education and explanation on how to use RPE scale       Expected Outcomes Short Term: Able to use RPE daily in rehab to express subjective intensity level;Long Term:  Able to use RPE to guide intensity level when exercising independently       Able to understand and use Dyspnea scale Yes       Intervention Provide education and explanation on how to use Dyspnea scale       Expected Outcomes Short Term: Able to use Dyspnea scale daily in rehab to express subjective sense of shortness of breath during exertion;Long Term: Able to use Dyspnea scale to guide intensity level when exercising independently       Knowledge and understanding of Target Heart Rate Range (THRR) Yes       Intervention Provide education and explanation of THRR including how the numbers were predicted and where they are located for reference       Expected Outcomes Short Term: Able to state/look up THRR;Long Term: Able to use THRR to  govern intensity when exercising independently;Short Term: Able to use daily as guideline for intensity in rehab       Able to check pulse independently Yes       Intervention Provide education and demonstration on how to check pulse in carotid and radial arteries.;Review the importance of being able to check your own pulse for safety during independent exercise       Expected Outcomes Long Term: Able to check pulse independently and accurately;Short Term: Able to explain why pulse checking is important during independent exercise       Understanding of Exercise Prescription Yes       Intervention Provide education, explanation, and written materials on patient's individual exercise prescription       Expected Outcomes Short Term: Able to explain program exercise prescription;Long Term: Able to explain home exercise prescription to exercise independently                Exercise Goals Re-Evaluation :  Exercise Goals Re-Evaluation     Row Name 08/25/22 1538 08/26/22 0932 09/06/22 1401 09/15/22 1622 09/21/22 0719     Exercise Goal Re-Evaluation   Exercise Goals Review Increase Physical Activity;Able to understand and use rate of perceived exertion (RPE) scale;Knowledge and understanding of Target Heart Rate Range (THRR);Understanding of Exercise Prescription;Increase Strength and Stamina;Able to understand and use Dyspnea scale;Able to check pulse independently Increase Physical Activity;Increase Strength and Stamina;Understanding of Exercise Prescription Increase Physical Activity;Increase Strength and Stamina;Understanding of Exercise Prescription Increase Physical Activity;Increase Strength and Stamina;Understanding of Exercise Prescription Increase Physical Activity;Increase Strength and Stamina;Understanding of Exercise Prescription   Comments Reviewed RPE scale, THR and program prescription with pt today.  Pt voiced understanding and was given a copy of goals to take home. Bridget Deleon did well for  her first session here at  rehab. She was able to complete 21 laps on the track! Her RPEs are appropriate and O2 saturations stayed above 88%. We will continue to monitor as she progresses in the program. Bridget Deleon is doing well in rehab. She recently increased her number of laps on the track up to 24 laps. She also has continued to work at level 1 on the T4 nustep and recumbent bike. She has done well with 2 lb hand weights for resistance training as well. We will continue to monitor her progress in the program. Bridget Deleon is up to 27 laps on the track. She would like to do the XR next time she is in class. She states that the biostep is very hard for her. Bridget Deleon is doing well in rehab.  She is up to 30 laps.  Her saturations are still doing well.  We will encourage her to try to increased seated equipment to level 2.  We will continue to monitor her progress.   Expected Outcomes Short: Use RPE daily to regulate intensity.  Long: Follow program prescription in THR. Short: Continue initial exercise prescription Long: Build up overall strength and stamina Short: Begin to progressively increase workloads. Long: Continue to improve strength and stamina. Short: work on the XR. Long: maintain exercise post [facility]. Short: Increase seated workloads Long: Conitnue to improve stamina.            Discharge Exercise Prescription (Final Exercise Prescription Changes):  Exercise Prescription Changes - 09/21/22 0700       Response to Exercise   Blood Pressure (Admit) 118/58    Blood Pressure (Exercise) 110/54    Blood Pressure (Exit) 110/60    Heart Rate (Admit) 70 bpm    Heart Rate (Exercise) 94 bpm    Heart Rate (Exit) 70 bpm    Oxygen Saturation (Admit) 96 %    Oxygen Saturation (Exercise) 96 %    Oxygen Saturation (Exit) 97 %    Rating of Perceived Exertion (Exercise) 13    Perceived Dyspnea (Exercise) 2    Symptoms SOB    Duration Progress to 30 minutes of  aerobic without signs/symptoms of physical distress     Intensity THRR unchanged      Progression   Progression Continue to progress workloads to maintain intensity without signs/symptoms of physical distress.    Average METs 2.31      Resistance Training   Training Prescription Yes    Weight 2 lb    Reps 10-15      Interval Training   Interval Training No      NuStep   Level 1    Minutes 15      Biostep-RELP   Level 1    Minutes 15    METs 2      Track   Laps 30    Minutes 15    METs 2.63      Oxygen   Maintain Oxygen Saturation 88% or higher             Nutrition:  Target Goals: Understanding of nutrition guidelines, daily intake of sodium 1500mg , cholesterol 200mg , calories 30% from fat and 7% or less from saturated fats, daily to have 5 or more servings of fruits and vegetables.  Education: All About Nutrition: -Group instruction provided by verbal, written material, interactive activities, discussions, models, and posters to present general guidelines for heart healthy nutrition including fat, fiber, MyPlate, the role of sodium in heart healthy nutrition, utilization of the nutrition label, and utilization of  this knowledge for meal planning. Follow up email sent as well. Written material given at graduation. Flowsheet Row Pulmonary Rehab from 09/15/2022 in [facility] Cardiac and Pulmonary Rehab  Education need identified 08/19/22       Biometrics:  Pre Biometrics - 08/19/22 1624       Pre Biometrics   Height 5\' 8"  (1.727 m)    Weight 134 lb (60.8 kg)    Waist Circumference 31 inches    Hip Circumference 38 inches    Waist to Hip Ratio 0.82 %    BMI (Calculated) 20.38    Single Leg Stand 2.5 seconds              Nutrition Therapy Plan and Nutrition Goals:  Nutrition Therapy & Goals - 08/19/22 1616       Intervention Plan   Intervention Prescribe, educate and counsel regarding individualized specific dietary modifications aiming towards targeted core components such as weight, hypertension, lipid  management, diabetes, heart failure and other comorbidities.    Expected Outcomes Short Term Goal: Understand basic principles of dietary content, such as calories, fat, sodium, cholesterol and nutrients.;Short Term Goal: A plan has been developed with personal nutrition goals set during dietitian appointment.;Long Term Goal: Adherence to prescribed nutrition plan.             Nutrition Assessments:  MEDIFICTS Score Key: ?70 Need to make dietary changes  40-70 Heart Healthy Diet ? 40 Therapeutic Level Cholesterol Diet  Flowsheet Row Pulmonary Rehab from 08/19/2022 in [facility] Cardiac and Pulmonary Rehab  Picture Your Plate Total Score on Admission 44      Picture Your Plate Scores: <40 Unhealthy dietary pattern with much room for improvement. 41-50 Dietary pattern unlikely to meet recommendations for good health and room for improvement. 51-60 More healthful dietary pattern, with some room for improvement.  >60 Healthy dietary pattern, although there may be some specific behaviors that could be improved.   Nutrition Goals Re-Evaluation:  Nutrition Goals Re-Evaluation     Row Name 09/15/22 1612             Goals   Current Weight 137 lb (62.1 kg)       Nutrition Goal Eat smaller portions       Comment Patient was informed on why it is important to maintain a balanced diet when dealing with Respiratory issues. Explained that it takes a lot of energy to breath and when they are short of breath often they will need to have a good diet to help keep up with the calories they are expending for breathing.       Expected Outcome Short: Choose and plan snacks accordingly to patients caloric intake to improve breathing. Long: Maintain a diet independently that meets their caloric intake to aid in daily shortness of breath.                Nutrition Goals Discharge (Final Nutrition Goals Re-Evaluation):  Nutrition Goals Re-Evaluation - 09/15/22 1612       Goals   Current Weight 137  lb (62.1 kg)    Nutrition Goal Eat smaller portions    Comment Patient was informed on why it is important to maintain a balanced diet when dealing with Respiratory issues. Explained that it takes a lot of energy to breath and when they are short of breath often they will need to have a good diet to help keep up with the calories they are expending for breathing.    Expected Outcome Short: Choose  and plan snacks accordingly to patients caloric intake to improve breathing. Long: Maintain a diet independently that meets their caloric intake to aid in daily shortness of breath.             Psychosocial: Target Goals: Acknowledge presence or absence of significant depression and/or stress, maximize coping skills, provide positive support system. Participant is able to verbalize types and ability to use techniques and skills needed for reducing stress and depression.   Education: Stress, Anxiety, and Depression - Group verbal and visual presentation to define topics covered.  Reviews how body is impacted by stress, anxiety, and depression.  Also discusses healthy ways to reduce stress and to treat/manage anxiety and depression.  Written material given at graduation. Flowsheet Row Pulmonary Rehab from 09/15/2022 in [facility] Cardiac and Pulmonary Rehab  Date 09/08/22  Educator KW  Instruction Review Code 1- [employer] Understanding       Education: Sleep Hygiene -Provides group verbal and written instruction about how sleep can affect your health.  Define sleep hygiene, discuss sleep cycles and impact of sleep habits. Review good sleep hygiene tips.    Initial Review & Psychosocial Screening:  Initial Psych Review & Screening - 08/12/22 1510       Initial Review   Current issues with Current Anxiety/Panic;Current Psychotropic Meds   zoloft  as needed for anxiety     Family Dynamics   Good Support System? Yes   Bridget Deleon in law and grand daughter  great grandson 38     Barriers    Psychosocial barriers to participate in program There are no identifiable barriers or psychosocial needs.      Screening Interventions   Interventions Encouraged to exercise;To provide support and resources with identified psychosocial needs;Provide feedback about the scores to participant    Expected Outcomes Short Term goal: Utilizing psychosocial counselor, staff and physician to assist with identification of specific Stressors or current issues interfering with healing process. Setting desired goal for each stressor or current issue identified.;Long Term Goal: Stressors or current issues are controlled or eliminated.;Short Term goal: Identification and review with participant of any Quality of Life or Depression concerns found by scoring the questionnaire.;Long Term goal: The participant improves quality of Life and PHQ9 Scores as seen by post scores and/or verbalization of changes             Quality of Life Scores:  Scores of 19 and below usually indicate a poorer quality of life in these areas.  A difference of  2-3 points is a clinically meaningful difference.  A difference of 2-3 points in the total score of the Quality of Life Index has been associated with significant improvement in overall quality of life, self-image, physical symptoms, and general health in studies assessing change in quality of life.  PHQ-9: Review Flowsheet       09/20/2022 09/15/2022 08/19/2022  Depression screen PHQ 2/9  Decreased Interest 1 0 2  Down, Depressed, Hopeless 1 1 1   PHQ - 2 Score 2 1 3   Altered sleeping 0 0 0  Tired, decreased energy 0 0 2  Change in appetite 0 0 3  Feeling bad or failure about yourself  0 0 3  Trouble concentrating 0 0 0  Moving slowly or fidgety/restless 0 0 0  Suicidal thoughts 0 0 1  PHQ-9 Score 2 1 12   Difficult doing work/chores Not difficult at all Not difficult at all Somewhat difficult   Interpretation of Total Score  Total Score  Depression Severity:  1-4 =  Minimal depression, 5-9 = Mild depression, 10-14 = Moderate depression, 15-19 = Moderately severe depression, 20-27 = Severe depression   Psychosocial Evaluation and Intervention:  Psychosocial Evaluation - 08/12/22 1634       Psychosocial Evaluation & Interventions   Interventions Encouraged to exercise with the program and follow exercise prescription    Comments Bridget Deleon is ready to start the program.  She does not have transportation. She was advised  about and given the [facility] information and phone number.  She lives alone and has support from friends. She would like to see improvement with her shortness of breath. She will plan on attending 2 days aweek.    Expected Outcomes STG Bridget Deleon will be able to attend all scheduled sessions and work on her exercise progression and improve her breathing with ADLs.  LTG Bridget Deleon will be able to continue to wrok on her exercise progression and improved breathing    Continue Psychosocial Services  Follow up required by staff             Psychosocial Re-Evaluation:  Psychosocial Re-Evaluation     Row Name 09/15/22 1619             Psychosocial Re-Evaluation   Current issues with Current Stress Concerns       Comments Reviewed patient health questionnaire (PHQ-9) with patient for follow up. Previously, patients score indicated signs/symptoms of depression.  Reviewed to see if patient is improving symptom wise while in program.  Score improved and patient states that it is because she is able to exersise more.       Expected Outcomes Short: Continue to attend [facility] regularly for regular exercise and social engagement. Long: Continue to improve symptoms and manage a positive mental state.       Interventions Encouraged to attend Pulmonary Rehabilitation for the exercise       Continue Psychosocial Services  Follow up required by staff                Psychosocial Discharge (Final Psychosocial Re-Evaluation):  Psychosocial Re-Evaluation - 09/15/22  1619       Psychosocial Re-Evaluation   Current issues with Current Stress Concerns    Comments Reviewed patient health questionnaire (PHQ-9) with patient for follow up. Previously, patients score indicated signs/symptoms of depression.  Reviewed to see if patient is improving symptom wise while in program.  Score improved and patient states that it is because she is able to exersise more.    Expected Outcomes Short: Continue to attend [facility] regularly for regular exercise and social engagement. Long: Continue to improve symptoms and manage a positive mental state.    Interventions Encouraged to attend Pulmonary Rehabilitation for the exercise    Continue Psychosocial Services  Follow up required by staff             Education: Education Goals: Education classes will be provided on a weekly basis, covering required topics. Participant will state understanding/return demonstration of topics presented.  Learning Barriers/Preferences:   General Pulmonary Education Topics:  Infection Prevention: - Provides verbal and written material to individual with discussion of infection control including proper hand washing and proper equipment cleaning during exercise session. Flowsheet Row Pulmonary Rehab from 09/15/2022 in [facility] Cardiac and Pulmonary Rehab  Education need identified 08/19/22  Date 08/19/22  Educator KW  Instruction Review Code 1- Verbalizes Understanding       Falls Prevention: - Provides verbal and written material to individual with discussion of  falls prevention and safety. Flowsheet Row Pulmonary Rehab from 08/12/2022 in [facility] Cardiac and Pulmonary Rehab  Date 08/12/22  Educator SB  Instruction Review Code 1- Verbalizes Understanding       Chronic Lung Disease Review: - Group verbal instruction with posters, models, PowerPoint presentations and videos,  to review new updates, new respiratory medications, new advancements in procedures and treatments. Providing  information on websites and "800" numbers for continued self-education. Includes information about supplement oxygen, available portable oxygen systems, continuous and intermittent flow rates, oxygen safety, concentrators, and Medicare reimbursement for oxygen. Explanation of Pulmonary Drugs, including class, frequency, complications, importance of spacers, rinsing mouth after steroid MDI's, and proper cleaning methods for nebulizers. Review of basic lung anatomy and physiology related to function, structure, and complications of lung disease. Review of risk factors. Discussion about methods for diagnosing sleep apnea and types of masks and machines for OSA. Includes a review of the use of types of environmental controls: home humidity, furnaces, filters, dust mite/pet prevention, HEPA vacuums. Discussion about weather changes, air quality and the benefits of nasal washing. Instruction on Warning signs, infection symptoms, calling MD promptly, preventive modes, and value of vaccinations. Review of effective airway clearance, coughing and/or vibration techniques. Emphasizing that all should Create an Action Plan. Written material given at graduation. Flowsheet Row Pulmonary Rehab from 09/15/2022 in [facility] Cardiac and Pulmonary Rehab  Education need identified 08/19/22  Date 09/01/22  Educator [facility]  Instruction Review Code 1- Verbalizes Understanding       AED/CPR: - Group verbal and written instruction with the use of models to demonstrate the basic use of the AED with the basic ABC's of resuscitation.    Anatomy and Cardiac Procedures: - Group verbal and visual presentation and models provide information about basic cardiac anatomy and function. Reviews the testing methods done to diagnose heart disease and the outcomes of the test results. Describes the treatment choices: Medical Management, Angioplasty, or Coronary Bypass Surgery for treating various heart conditions including Myocardial Infarction,  Angina, Valve Disease, and Cardiac Arrhythmias.  Written material given at graduation.   Medication Safety: - Group verbal and visual instruction to review commonly prescribed medications for heart and lung disease. Reviews the medication, class of the drug, and side effects. Includes the steps to properly store meds and maintain the prescription regimen.  Written material given at graduation.   Other: -Provides group and verbal instruction on various topics (see comments)   Knowledge Questionnaire Score:  Knowledge Questionnaire Score - 08/19/22 1611       Knowledge Questionnaire Score   Pre Score 15/18              Core Components/Risk Factors/Patient Goals at Admission:  Personal Goals and Risk Factors at Admission - 08/19/22 1636       Core Components/Risk Factors/Patient Goals on Admission    Weight Management Yes;Weight Gain   Per pt request, had lost some weight and would like to regain some back   Intervention Weight Management: Develop a combined nutrition and exercise program designed to reach desired caloric intake, while maintaining appropriate intake of nutrient and fiber, sodium and fats, and appropriate energy expenditure required for the weight goal.;Weight Management: Provide education and appropriate resources to help participant work on and attain dietary goals.    Admit Weight 134 lb (60.8 kg)    Goal Weight: Short Term 137 lb (62.1 kg)    Goal Weight: Long Term 140 lb (63.5 kg)    Expected Outcomes Short Term: Continue  to assess and modify interventions until short term weight is achieved;Long Term: Adherence to nutrition and physical activity/exercise program aimed toward attainment of established weight goal;Weight Gain: Understanding of general recommendations for a high calorie, high protein meal plan that promotes weight gain by distributing calorie intake throughout the day with the consumption for 4-5 meals, snacks, and/or supplements;Understanding  recommendations for meals to include 15-35% energy as protein, 25-35% energy from fat, 35-60% energy from carbohydrates, less than 200mg  of dietary cholesterol, 20-35 gm of total fiber daily;Understanding of distribution of calorie intake throughout the day with the consumption of 4-5 meals/snacks    Improve shortness of breath with ADL's Yes    Intervention Provide education, individualized exercise plan and daily activity instruction to help decrease symptoms of SOB with activities of daily living.    Expected Outcomes Short Term: Improve cardiorespiratory fitness to achieve a reduction of symptoms when performing ADLs;Long Term: Be able to perform more ADLs without symptoms or delay the onset of symptoms    Increase knowledge of respiratory medications and ability to use respiratory devices properly  Yes    Intervention Provide education and demonstration as needed of appropriate use of medications, inhalers, and oxygen therapy.    Expected Outcomes Short Term: Achieves understanding of medications use. Understands that oxygen is a medication prescribed by physician. Demonstrates appropriate use of inhaler and oxygen therapy.;Long Term: Maintain appropriate use of medications, inhalers, and oxygen therapy.    Hypertension Yes    Intervention Provide education on lifestyle modifcations including regular physical activity/exercise, weight management, moderate sodium restriction and increased consumption of fresh fruit, vegetables, and low fat dairy, alcohol moderation, and smoking cessation.;Monitor prescription use compliance.    Expected Outcomes Short Term: Continued assessment and intervention until BP is < 140/65mm HG in hypertensive participants. < 130/24mm HG in hypertensive participants with diabetes, heart failure or chronic kidney disease.;Long Term: Maintenance of blood pressure at goal levels.             Education:Diabetes - Individual verbal and written instruction to review  signs/symptoms of diabetes, desired ranges of glucose level fasting, after meals and with exercise. Acknowledge that pre and post exercise glucose checks will be done for 3 sessions at entry of program.   Know Your Numbers and Heart Failure: - Group verbal and visual instruction to discuss disease risk factors for cardiac and pulmonary disease and treatment options.  Reviews associated critical values for Overweight/Obesity, Hypertension, Cholesterol, and Diabetes.  Discusses basics of heart failure: signs/symptoms and treatments.  Introduces Heart Failure Zone chart for action plan for heart failure.  Written material given at graduation. Flowsheet Row Pulmonary Rehab from 09/15/2022 in [facility] Cardiac and Pulmonary Rehab  Date 08/25/22  Educator MS  Instruction Review Code 1- Verbalizes Understanding       Core Components/Risk Factors/Patient Goals Review:   Goals and Risk Factor Review     Row Name 09/15/22 1611             Core Components/Risk Factors/Patient Goals Review   Personal Goals Review Improve shortness of breath with ADL's       Review Spoke to patient about their shortness of breath and what they can do to improve. Patient has been informed of breathing techniques when starting the program. Patient is informed to tell staff if they have had any med changes and that certain meds they are taking or not taking can be causing shortness of breath.       Expected Outcomes Short: Attend  [facility] regularly to improve shortness of breath with ADL's. Long: maintain independence with ADL's                Core Components/Risk Factors/Patient Goals at Discharge (Final Review):   Goals and Risk Factor Review - 09/15/22 1611       Core Components/Risk Factors/Patient Goals Review   Personal Goals Review Improve shortness of breath with ADL's    Review Spoke to patient about their shortness of breath and what they can do to improve. Patient has been informed of breathing  techniques when starting the program. Patient is informed to tell staff if they have had any med changes and that certain meds they are taking or not taking can be causing shortness of breath.    Expected Outcomes Short: Attend [facility] regularly to improve shortness of breath with ADL's. Long: maintain independence with ADL's             ITP Comments:  ITP Comments     Row Name 08/12/22 1633 08/19/22 1608 08/25/22 1536 09/01/22 0939 09/29/22 1131   ITP Comments Virtual orientation call completed today. shehas an appointment on Date: 5/22024  for EP eval and gym Orientation.  Documentation of diagnosis can be found in C HLDate: 07/07/2022 . Completed and gym orientation. Initial ITP created and sent for review to Dr. Vida Deleon, Medical Director. First full day of exercise!  Patient was oriented to gym and equipment including functions, settings, policies, and procedures.  Patient's individual exercise prescription and treatment plan were reviewed.  All starting workloads were established based on the results of the 6 minute walk test done at initial orientation visit.  The plan for exercise progression was also introduced and progression will be customized based on patient's performance and goals. 30 Day review completed. Medical Director ITP review done, changes made as directed, and signed approval by Medical Director.    new to program 30 Day review completed. Medical Director ITP review done, changes made as directed, and signed approval by Medical Director.            Comments:

## 2022-10-04 ENCOUNTER — Encounter: Payer: Medicare PPO | Admitting: *Deleted

## 2022-10-04 DIAGNOSIS — J449 Chronic obstructive pulmonary disease, unspecified: Secondary | ICD-10-CM | POA: Diagnosis not present

## 2022-10-04 NOTE — Progress Notes (Signed)
Daily Session Note  Patient Details  Name: Demica Sugai MRN: [id number] Date of Birth: 04-Nov-1940 Referring Provider:   Flowsheet Row Pulmonary Rehab from 08/19/2022 in [facility] Cardiac and Pulmonary Rehab  Referring Provider Vida Rigger MD       Encounter Date: 10/04/2022  Check In:  Session Check In - 10/04/22 1528       Check-In   Supervising physician immediately available to respond to emergencies See telemetry face sheet for immediately available ER MD    Location [facility]-Cardiac & Pulmonary Rehab    Staff Present Susann Givens, RN BSN;Joseph Reino Kent, RCP,RRT,BSRT;Noah [place], [place], Exercise Physiologist    Virtual Visit No    Medication changes reported     No    Fall or balance concerns reported    No    Warm-up and Cool-down Performed on first and last piece of equipment    Resistance Training Performed Yes    VAD Patient? No    PAD/SET Patient? No      Pain Assessment   Currently in Pain? No/denies                Social History   Tobacco Use  Smoking Status Former   Packs/day: 1.00   Years: 55.00   Additional pack years: 0.00   Total pack years: 55.00   Types: Cigarettes   Quit date: 2009   Years since quitting: 15.4  Smokeless Tobacco Never    Goals Met:  Independence with exercise equipment Exercise tolerated well No report of concerns or symptoms today Strength training completed today  Goals Unmet:  Not Applicable  Comments: Pt able to follow exercise prescription today without complaint.  Will continue to monitor for progression.    Dr. Bethann Punches is Medical Director for [facility] Cardiac Rehabilitation.  Dr. Vida Rigger is Medical Director for [facility] Pulmonary Rehabilitation.

## 2022-10-06 ENCOUNTER — Encounter: Payer: Medicare PPO | Admitting: *Deleted

## 2022-10-06 DIAGNOSIS — J449 Chronic obstructive pulmonary disease, unspecified: Secondary | ICD-10-CM | POA: Diagnosis not present

## 2022-10-06 NOTE — Progress Notes (Signed)
Daily Session Note  Patient Details  Name: Bridget Deleon MRN: [id number] Date of Birth: [birth year] Referring Provider:   Flowsheet Row Pulmonary Rehab from 08/19/2022 in [facility] Cardiac and Pulmonary Rehab  Referring Provider Vida Rigger MD       Encounter Date: 10/06/2022  Check In:  Session Check In - 10/06/22 1534       Check-In   Supervising physician immediately available to respond to emergencies See telemetry face sheet for immediately available ER MD    Location [facility]-Cardiac & Pulmonary Rehab    Staff Present Susann Givens, RN BSN;Joseph Reino Kent, Guinevere Ferrari, RN, [place]    Virtual Visit No    Medication changes reported     No    Fall or balance concerns reported    No    Warm-up and Cool-down Performed on first and last piece of equipment    Resistance Training Performed Yes    VAD Patient? No    PAD/SET Patient? No      Pain Assessment   Currently in Pain? No/denies                Social History   Tobacco Use  Smoking Status Former   Packs/day: 1.00   Years: 55.00   Additional pack years: 0.00   Total pack years: 55.00   Types: Cigarettes   Quit date: 2009   Years since quitting: 15.4  Smokeless Tobacco Never    Goals Met:  Independence with exercise equipment Exercise tolerated well No report of concerns or symptoms today Strength training completed today  Goals Unmet:  Not Applicable  Comments: Pt able to follow exercise prescription today without complaint.  Will continue to monitor for progression.    Dr. Bethann Punches is Medical Director for [facility] Cardiac Rehabilitation.  Dr. Vida Rigger is Medical Director for [facility] Pulmonary Rehabilitation.

## 2022-10-11 ENCOUNTER — Encounter: Payer: Medicare PPO | Admitting: *Deleted

## 2022-10-11 DIAGNOSIS — J449 Chronic obstructive pulmonary disease, unspecified: Secondary | ICD-10-CM

## 2022-10-11 NOTE — Progress Notes (Signed)
Daily Session Note  Patient Details  Name: Bridget Deleon MRN: [id number] Date of Birth: 1941/01/30 Referring Provider:   Flowsheet Row Pulmonary Rehab from 08/19/2022 in [facility] Cardiac and Pulmonary Rehab  Referring Provider Vida Rigger MD       Encounter Date: 10/11/2022  Check In:  Session Check In - 10/11/22 1533       Check-In   Supervising physician immediately available to respond to emergencies See telemetry face sheet for immediately available ER MD    Location [facility]-Cardiac & Pulmonary Rehab    Staff Present Susann Givens, RN BSN;Noah Tickle, BS, Exercise Physiologist;Laureen Manson Passey, BS, RRT, CPFT    Virtual Visit No    Medication changes reported     No    Fall or balance concerns reported    No    Warm-up and Cool-down Performed on first and last piece of equipment    Resistance Training Performed Yes    VAD Patient? No    PAD/SET Patient? No      Pain Assessment   Currently in Pain? No/denies                Social History   Tobacco Use  Smoking Status Former   Packs/day: 1.00   Years: 55.00   Additional pack years: 0.00   Total pack years: 55.00   Types: Cigarettes   Quit date: 2009   Years since quitting: 15.4  Smokeless Tobacco Never    Goals Met:  Independence with exercise equipment Exercise tolerated well No report of concerns or symptoms today Strength training completed today  Goals Unmet:  Not Applicable  Comments: Pt able to follow exercise prescription today without complaint.  Will continue to monitor for progression.'    Dr. Bethann Punches is Medical Director for [facility] Cardiac Rehabilitation.  Dr. Vida Rigger is Medical Director for [facility] Pulmonary Rehabilitation.

## 2022-10-13 ENCOUNTER — Encounter: Payer: Medicare PPO | Admitting: *Deleted

## 2022-10-13 DIAGNOSIS — J449 Chronic obstructive pulmonary disease, unspecified: Secondary | ICD-10-CM | POA: Diagnosis not present

## 2022-10-13 NOTE — Progress Notes (Signed)
Daily Session Note  Patient Details  Name: Bridget Deleon MRN: [id number] Date of Birth: [birth year] Referring Provider:   Flowsheet Row Pulmonary Rehab from 08/19/2022 in [facility] Cardiac and Pulmonary Rehab  Referring Provider Vida Rigger MD       Encounter Date: 10/13/2022  Check In:  Session Check In - 10/13/22 1528       Check-In   Supervising physician immediately available to respond to emergencies See telemetry face sheet for immediately available ER MD    Location [facility]-Cardiac & Pulmonary Rehab    Staff Present Susann Givens, RN Mabeline Caras, BS, ACSM CEP, Exercise Physiologist;Laureen Manson Passey, BS, RRT, CPFT    Virtual Visit No    Medication changes reported     No    Fall or balance concerns reported    No    Warm-up and Cool-down Performed on first and last piece of equipment    Resistance Training Performed Yes    VAD Patient? No    PAD/SET Patient? No      Pain Assessment   Currently in Pain? No/denies                Social History   Tobacco Use  Smoking Status Former   Packs/day: 1.00   Years: 55.00   Additional pack years: 0.00   Total pack years: 55.00   Types: Cigarettes   Quit date: 2009   Years since quitting: 15.4  Smokeless Tobacco Never    Goals Met:  Independence with exercise equipment Exercise tolerated well No report of concerns or symptoms today Strength training completed today  Goals Unmet:  Not Applicable  Comments: Pt able to follow exercise prescription today without complaint.  Will continue to monitor for progression.    Dr. Bethann Punches is Medical Director for [facility] Cardiac Rehabilitation.  Dr. Vida Rigger is Medical Director for [facility] Pulmonary Rehabilitation.

## 2022-10-18 ENCOUNTER — Encounter: Payer: Medicare PPO | Attending: Pulmonary Disease | Admitting: *Deleted

## 2022-10-18 DIAGNOSIS — J449 Chronic obstructive pulmonary disease, unspecified: Secondary | ICD-10-CM | POA: Diagnosis not present

## 2022-10-18 NOTE — Progress Notes (Signed)
Daily Session Note  Patient Details  Name: Bridget Deleon MRN: [id number] Date of Birth: 02/28/1941 Referring Provider:   Flowsheet Row Pulmonary Rehab from 08/19/2022 in [facility] Cardiac and Pulmonary Rehab  Referring Provider Vida Rigger MD       Encounter Date: 10/18/2022  Check In:  Session Check In - 10/18/22 1547       Check-In   Supervising physician immediately available to respond to emergencies See telemetry face sheet for immediately available ER MD    Location [facility]-Cardiac & Pulmonary Rehab    Staff Present Susann Givens, RN BSN;Joseph [place], RCP,RRT,BSRT;Kelly Newell, [place], ACSM CEP, Exercise Physiologist    Virtual Visit No    Medication changes reported     No    Fall or balance concerns reported    No    Warm-up and Cool-down Performed on first and last piece of equipment    Resistance Training Performed Yes    VAD Patient? No    PAD/SET Patient? No      Pain Assessment   Currently in Pain? No/denies                Social History   Tobacco Use  Smoking Status Former   Packs/day: 1.00   Years: 55.00   Additional pack years: 0.00   Total pack years: 55.00   Types: Cigarettes   Quit date: 2009   Years since quitting: 15.5  Smokeless Tobacco Never    Goals Met:  Independence with exercise equipment Exercise tolerated well No report of concerns or symptoms today Strength training completed today  Goals Unmet:  Not Applicable  Comments: Pt able to follow exercise prescription today without complaint.  Will continue to monitor for progression.'   Dr. Bethann Punches is Medical Director for [facility] Cardiac Rehabilitation.  Dr. Vida Rigger is Medical Director for [facility] Pulmonary Rehabilitation.

## 2022-10-20 ENCOUNTER — Encounter: Payer: Medicare PPO | Admitting: *Deleted

## 2022-10-20 DIAGNOSIS — J449 Chronic obstructive pulmonary disease, unspecified: Secondary | ICD-10-CM

## 2022-10-20 NOTE — Progress Notes (Signed)
Daily Session Note  Patient Details  Name: Bridget Deleon MRN: [id number] Date of Birth: Feb 27, 1941 Referring Provider:   Flowsheet Row Pulmonary Rehab from 08/19/2022 in [facility] Cardiac and Pulmonary Rehab  Referring Provider Vida Rigger MD       Encounter Date: 10/20/2022  Check In:  Session Check In - 10/20/22 1531       Check-In   Supervising physician immediately available to respond to emergencies See telemetry face sheet for immediately available ER MD    Location [facility]-Cardiac & Pulmonary Rehab    Staff Present Lanny Hurst, RN, ADN;Laureen Manson Passey, BS, RRT, CPFT;Tobiah Celestine Jewel Baize, RN BSN    Virtual Visit No    Medication changes reported     No    Fall or balance concerns reported    No    Warm-up and Cool-down Performed on first and last piece of equipment    Resistance Training Performed Yes    VAD Patient? No    PAD/SET Patient? No      Pain Assessment   Currently in Pain? No/denies                Social History   Tobacco Use  Smoking Status Former   Packs/day: 1.00   Years: 55.00   Additional pack years: 0.00   Total pack years: 55.00   Types: Cigarettes   Quit date: 2009   Years since quitting: 15.5  Smokeless Tobacco Never    Goals Met:  Independence with exercise equipment Exercise tolerated well No report of concerns or symptoms today Strength training completed today  Goals Unmet:  Not Applicable  Comments: Pt able to follow exercise prescription today without complaint.  Will continue to monitor for progression.    Dr. Bethann Punches is Medical Director for [facility] Cardiac Rehabilitation.  Dr. Vida Rigger is Medical Director for [facility] Pulmonary Rehabilitation.

## 2022-10-25 ENCOUNTER — Encounter: Payer: Medicare PPO | Admitting: *Deleted

## 2022-10-25 DIAGNOSIS — J449 Chronic obstructive pulmonary disease, unspecified: Secondary | ICD-10-CM | POA: Diagnosis not present

## 2022-10-25 NOTE — Progress Notes (Signed)
Daily Session Note  Patient Details  Name: Bridget Deleon MRN: [id number] Date of Birth: [birth year] Referring Provider:   Flowsheet Row Pulmonary Rehab from 08/19/2022 in [facility] Cardiac and Pulmonary Rehab  Referring Provider Vida Rigger MD       Encounter Date: 10/25/2022  Check In:  Session Check In - 10/25/22 1538       Check-In   Supervising physician immediately available to respond to emergencies See telemetry face sheet for immediately available ER MD    Location [facility]-Cardiac & Pulmonary Rehab    Staff Present Susann Givens, RN BSN;Susanne Bice, RN, BSN, CCRP;Laureen Manson Passey, BS, RRT, CPFT    Virtual Visit No    Medication changes reported     No    Fall or balance concerns reported    No    Warm-up and Cool-down Performed on first and last piece of equipment    Resistance Training Performed Yes    VAD Patient? No    PAD/SET Patient? No      Pain Assessment   Currently in Pain? No/denies                Social History   Tobacco Use  Smoking Status Former   Packs/day: 1.00   Years: 55.00   Additional pack years: 0.00   Total pack years: 55.00   Types: Cigarettes   Quit date: 2009   Years since quitting: 15.5  Smokeless Tobacco Never    Goals Met:  Independence with exercise equipment Exercise tolerated well No report of concerns or symptoms today Strength training completed today  Goals Unmet:  Not Applicable  Comments: Pt able to follow exercise prescription today without complaint.  Will continue to monitor for progression.    Dr. Bethann Punches is Medical Director for [facility] Cardiac Rehabilitation.  Dr. Vida Rigger is Medical Director for [facility] Pulmonary Rehabilitation.

## 2022-10-26 ENCOUNTER — Encounter: Payer: Self-pay | Admitting: *Deleted

## 2022-10-26 DIAGNOSIS — J449 Chronic obstructive pulmonary disease, unspecified: Secondary | ICD-10-CM

## 2022-10-26 NOTE — Progress Notes (Signed)
Pulmonary Individual Treatment Plan  Patient Details  Name: Bridget Deleon MRN: [id number] Date of Birth: 28-Feb-1941 Referring Provider:   Flowsheet Row Pulmonary Rehab from 08/19/2022 in [facility]monary Rehab  Referring Provider Vida Rigger MD       Initial Encounter Date:  Flowsheet Row Pulmonary Rehab from 08/19/2022 in [facility] Cardiac and Pulmonary Rehab  Date 08/19/22       Visit Diagnosis: Stage 2 moderate COPD by GOLD classification (HCC)  Patient's Home Medications on Admission:  Current Outpatient Medications:    alendronate (FOSAMAX) 70 MG tablet, Take 70 mg by mouth every 7 (seven) days. On Saturday.  Take with a full glass of water on an empty stomach., Disp: , Rfl:    ALPRAZolam (XANAX) 0.25 MG tablet, Take by mouth., Disp: , Rfl:    benzonatate (TESSALON) 200 MG capsule, Take 200 mg by mouth 3 (three) times daily as needed for cough., Disp: , Rfl:    Calcium Carbonate-Vitamin D (CALCIUM-D) 600-400 MG-UNIT TABS, Take 1 tablet by mouth every morning., Disp: , Rfl:    cholecalciferol (VITAMIN D) 1000 UNITS tablet, Take 1,000 Units by mouth every morning., Disp: , Rfl:    CRANBERRY PO, Take 1,500 mg by mouth every morning., Disp: , Rfl:    dicyclomine (BENTYL) 20 MG tablet, Take 20 mg by mouth 3 (three) times daily as needed for spasms., Disp: , Rfl:    diphenhydramine-acetaminophen (TYLENOL PM) 25-500 MG TABS tablet, Take 1 tablet by mouth at bedtime as needed., Disp: , Rfl:    famotidine (PEPCID) 20 MG tablet, Take by mouth., Disp: , Rfl:    fluticasone (FLONASE) 50 MCG/ACT nasal spray, Place 1 spray into both nostrils daily., Disp: , Rfl:    gentamicin cream (GARAMYCIN) 0.1 %, Apply 1 application topically 3 (three) times daily as needed., Disp: , Rfl:    guaiFENesin-dextromethorphan (ROBITUSSIN DM) 100-10 MG/5ML syrup, Take 5 mLs by mouth every 4 (four) hours as needed for cough., Disp: 118 mL, Rfl: 0   hydrocortisone 2.5 % cream, Apply topically 2 (two)  times daily as needed., Disp: , Rfl:    ibuprofen (ADVIL,MOTRIN) 800 MG tablet, Take 800 mg by mouth 2 (two) times daily as needed. For pain, Disp: , Rfl:    losartan (COZAAR) 100 MG tablet, Take by mouth., Disp: , Rfl:    methocarbamol (ROBAXIN) 500 MG tablet, Take 500 mg by mouth 3 (three) times daily as needed for muscle spasms., Disp: , Rfl:    NIFEdipine (PROCARDIA-XL/NIFEDICAL-XL) 30 MG 24 hr tablet, Take 30 mg by mouth daily., Disp: , Rfl:    ondansetron (ZOFRAN-ODT) 8 MG disintegrating tablet, Take 8 mg by mouth every 8 (eight) hours as needed for nausea or vomiting., Disp: , Rfl:    oxyCODONE-acetaminophen (PERCOCET/ROXICET) 5-325 MG tablet, Take by mouth every 4 (four) hours as needed for severe pain., Disp: , Rfl:    senna-docusate (SENOKOT-S) 8.6-50 MG per tablet, Take 2 tablets by mouth at bedtime as needed. For constipation, Disp: , Rfl:    sertraline (ZOLOFT) 50 MG tablet, Take 1 tablet by mouth daily., Disp: , Rfl:    torsemide (DEMADEX) 20 MG tablet, Take 20 mg by mouth 3 (three) times a week., Disp: , Rfl:    traMADol (ULTRAM) 50 MG tablet, Take by mouth every 6 (six) hours as needed., Disp: , Rfl:    TRELEGY ELLIPTA 100-62.5-25 MCG/ACT AEPB, Inhale 1 puff into the lungs daily., Disp: , Rfl:    triamcinolone cream (KENALOG) 0.1 %,  Apply 1 application topically 2 (two) times daily as needed., Disp: , Rfl:   Past Medical History: Past Medical History:  Diagnosis Date   Arthritis    osteoarthritis   Cancer (HCC) 2010   right lower lobe cancer    Complication of anesthesia 23 yrs ago .    woke up on operating table, also A-fib for short time after Lung surgery   Coronary artery disease 2010   woke up and states she was told  bottom of heart was asleep ... takes diltiazem for that reason .   GERD (gastroesophageal reflux disease)    Ovarian ca (HCC) 1972   Wears dentures    full upper and lower    Tobacco Use: Social History   Tobacco Use  Smoking Status Former    Packs/day: 1.00   Years: 55.00   Additional pack years: 0.00   Total pack years: 55.00   Types: Cigarettes   Quit date: 2009   Years since quitting: 15.5  Smokeless Tobacco Never    Labs: Review Flowsheet        No data to display           Pulmonary Assessment Scores:  Pulmonary Assessment Scores     Row Name 08/19/22 1611         ADL UCSD   ADL Phase Entry     SOB Score total 74     Rest 0     Walk 2     Stairs 5     Bath 3     Dress 3     Shop 5       CAT Score   CAT Score 13       mMRC Score   mMRC Score 3              UCSD: Self-administered rating of dyspnea associated with activities of daily living (ADLs) 6-point scale (0 = "not at all" to 5 = "maximal or unable to do because of breathlessness")  Scoring Scores range from 0 to 120.  Minimally important difference is 5 units  CAT: CAT can identify the health impairment of COPD patients and is better correlated with disease progression.  CAT has a scoring range of zero to 40. The CAT score is classified into four groups of low (less than 10), medium (10 - 20), high (21-30) and very high (31-40) based on the impact level of disease on health status. A CAT score over 10 suggests significant symptoms.  A worsening CAT score could be explained by an exacerbation, poor medication adherence, poor inhaler technique, or progression of COPD or comorbid conditions.  CAT MCID is 2 points  mMRC: mMRC (Modified Medical Research Council) Dyspnea Scale is used to assess the degree of baseline functional disability in patients of respiratory disease due to dyspnea. No minimal important difference is established. A decrease in score of 1 point or greater is considered a positive change.   Pulmonary Function Assessment:  Pulmonary Function Assessment - 08/19/22 1618       Pulmonary Function Tests   FEV1% 53 %    FEV1/FVC Ratio 58.9   Taken from PFT 07/20/22 ([facility])     Post Bronchodilator Spirometry Results    FEV1% 52 %    FEV1/FVC Ratio 55.97             Exercise Target Goals: Exercise Program Goal: Individual exercise prescription set using results from initial 6 min walk test and THRR while considering  patient's activity barriers and safety.   Exercise Prescription Goal: Initial exercise prescription builds to 30-45 minutes a day of aerobic activity, 2-3 days per week.  Home exercise guidelines will be given to patient during program as part of exercise prescription that the participant will acknowledge.  Education: Aerobic Exercise: - Group verbal and visual presentation on the components of exercise prescription. Introduces F.I.T.T principle from ACSM for exercise prescriptions.  Reviews F.I.T.T. principles of aerobic exercise including progression. Written material given at graduation.   Education: Resistance Exercise: - Group verbal and visual presentation on the components of exercise prescription. Introduces F.I.T.T principle from ACSM for exercise prescriptions  Reviews F.I.T.T. principles of resistance exercise including progression. Written material given at graduation.    Education: Exercise & Equipment Safety: - Individual verbal instruction and demonstration of equipment use and safety with use of the equipment. Flowsheet Row Pulmonary Rehab from 09/15/2022 in [facility] Cardiac and Pulmonary Rehab  Education need identified 08/19/22  Date 08/19/22  Educator KW  Instruction Review Code 1- Verbalizes Understanding       Education: Exercise Physiology & General Exercise Guidelines: - Group verbal and written instruction with models to review the exercise physiology of the cardiovascular system and associated critical values. Provides general exercise guidelines with specific guidelines to those with heart or lung disease.  Flowsheet Row Pulmonary Rehab from 09/15/2022 in [facility] Cardiac and Pulmonary Rehab  Date 09/15/22  Educator [facility]  Instruction Review Code 1- Verbalizes  Understanding       Education: Flexibility, Balance, Mind/Body Relaxation: - Group verbal and visual presentation with interactive activity on the components of exercise prescription. Introduces F.I.T.T principle from ACSM for exercise prescriptions. Reviews F.I.T.T. principles of flexibility and balance exercise training including progression. Also discusses the mind body connection.  Reviews various relaxation techniques to help reduce and manage stress (i.e. Deep breathing, progressive muscle relaxation, and visualization). Balance handout provided to take home. Written material given at graduation.   Activity Barriers & Risk Stratification:  Activity Barriers & Cardiac Risk Stratification - 08/19/22 1624       Activity Barriers & Cardiac Risk Stratification   Activity Barriers Arthritis;Balance Concerns;Deconditioning;Muscular Weakness;Assistive Device;Shortness of Breath             6 Minute Walk:  6 Minute Walk     Row Name 08/19/22 1624         6 Minute Walk   Phase Initial     Distance 570 feet     Walk Time 6 minutes     # of Rest Breaks 0     MPH 1.07     METS 1.48     RPE 15     Perceived Dyspnea  3     VO2 Peak 5.21     Symptoms Yes (comment)     Comments SOB, general fatigue     Resting HR 72 bpm     Resting BP 122/68     Resting Oxygen Saturation  96 %     Exercise Oxygen Saturation  during 6 min walk 94 %     Max Ex. HR 89 bpm     Max Ex. BP 142/68     2 Minute Post BP 124/68       Interval HR   1 Minute HR 85     2 Minute HR 88     3 Minute HR 87     4 Minute HR 89     5 Minute HR 88  6 Minute HR 87     2 Minute Post HR 70     Interval Heart Rate? Yes       Interval Oxygen   Interval Oxygen? Yes     Baseline Oxygen Saturation % 96 %     1 Minute Oxygen Saturation % 94 %     1 Minute Liters of Oxygen 0 L  RA     2 Minute Oxygen Saturation % 94 %     2 Minute Liters of Oxygen 0 L     3 Minute Oxygen Saturation % 94 %     3 Minute  Liters of Oxygen 0 L     4 Minute Oxygen Saturation % 97 %     4 Minute Liters of Oxygen 0 L     5 Minute Oxygen Saturation % 97 %     5 Minute Liters of Oxygen 0 L     6 Minute Oxygen Saturation % 97 %     6 Minute Liters of Oxygen 0 L     2 Minute Post Oxygen Saturation % 98 %     2 Minute Post Liters of Oxygen 0 L             Oxygen Initial Assessment:  Oxygen Initial Assessment - 08/19/22 1611       Home Oxygen   Home Oxygen Device None    Sleep Oxygen Prescription None    Home Exercise Oxygen Prescription None    Home Resting Oxygen Prescription None      Initial 6 min Walk   Oxygen Used None      Program Oxygen Prescription   Program Oxygen Prescription None      Intervention   Short Term Goals To learn and demonstrate proper pursed lip breathing techniques or other breathing techniques. ;To learn and demonstrate proper use of respiratory medications    Long  Term Goals Compliance with respiratory medication;Demonstrates proper use of MDI's;Exhibits proper breathing techniques, such as pursed lip breathing or other method taught during program session             Oxygen Re-Evaluation:  Oxygen Re-Evaluation     Row Name 08/25/22 1538 09/15/22 1609           Program Oxygen Prescription   Program Oxygen Prescription -- None        Home Oxygen   Home Oxygen Device -- None      Sleep Oxygen Prescription -- None      Home Exercise Oxygen Prescription -- None      Home Resting Oxygen Prescription -- None        Goals/Expected Outcomes   Short Term Goals -- To learn and demonstrate proper use of respiratory medications;To learn and understand importance of maintaining oxygen saturations>88%      Long  Term Goals -- Maintenance of O2 saturations>88%;Demonstrates proper use of MDI's;Verbalizes importance of monitoring SPO2 with pulse oximeter and return demonstration      Comments Reviewed PLB technique with pt.  Talked about how it works and it's importance  in maintaining their exercise saturations. She does not have a pulse oximeter to check her/his oxygen saturation at home. Informed her where to get one and explained why it is important to have one. Reviewed that oxygen saturations should be 88 percent and above.      Goals/Expected Outcomes Short: Become more profiecient at using PLB.   Long: Become independent at using PLB. Short: monitor oxygen at home  with exertion. Long: maintain oxygen saturations above 88 percent independently.               Oxygen Discharge (Final Oxygen Re-Evaluation):  Oxygen Re-Evaluation - 09/15/22 1609       Program Oxygen Prescription   Program Oxygen Prescription None      Home Oxygen   Home Oxygen Device None    Sleep Oxygen Prescription None    Home Exercise Oxygen Prescription None    Home Resting Oxygen Prescription None      Goals/Expected Outcomes   Short Term Goals To learn and demonstrate proper use of respiratory medications;To learn and understand importance of maintaining oxygen saturations>88%    Long  Term Goals Maintenance of O2 saturations>88%;Demonstrates proper use of MDI's;Verbalizes importance of monitoring SPO2 with pulse oximeter and return demonstration    Comments She does not have a pulse oximeter to check her/his oxygen saturation at home. Informed her where to get one and explained why it is important to have one. Reviewed that oxygen saturations should be 88 percent and above.    Goals/Expected Outcomes Short: monitor oxygen at home with exertion. Long: maintain oxygen saturations above 88 percent independently.             Initial Exercise Prescription:  Initial Exercise Prescription - 08/19/22 1600       Date of Initial Exercise RX and Referring Provider   Date 08/19/22    Referring Provider Vida Rigger MD      Oxygen   Maintain Oxygen Saturation 88% or higher      NuStep   Level 1    SPM 80    Minutes 15    METs 1.4      Biostep-RELP   Level 1     SPM 50    Minutes 15    METs 1.4      Track   Laps 7    Minutes 15    METs 1.44      Prescription Details   Frequency (times per week) 2    Duration Progress to 30 minutes of continuous aerobic without signs/symptoms of physical distress      Intensity   THRR 40-80% of Max Heartrate 99 - 126    Ratings of Perceived Exertion 11-13    Perceived Dyspnea 0-4      Progression   Progression Continue to progress workloads to maintain intensity without signs/symptoms of physical distress.      Resistance Training   Training Prescription Yes    Weight 2 lb    Reps 10-15             Perform Capillary Blood Glucose checks as needed.  Exercise Prescription Changes:   Exercise Prescription Changes     Row Name 08/19/22 1600 08/26/22 0900 09/06/22 1300 09/21/22 0700 10/04/22 1400     Response to Exercise   Blood Pressure (Admit) 122/68 130/70 110/60 118/58 120/60   Blood Pressure (Exercise) 142/68 130/60 138/68 110/54 142/68   Blood Pressure (Exit) 124/68 110/62 118/58 110/60 114/60   Heart Rate (Admit) 72 bpm 67 bpm 73 bpm 70 bpm 70 bpm   Heart Rate (Exercise) 89 bpm 94 bpm 85 bpm 94 bpm 94 bpm   Heart Rate (Exit) 70 bpm 80 bpm 67 bpm 70 bpm 70 bpm   Oxygen Saturation (Admit) 96 % 97 % 98 % 96 % 93 %   Oxygen Saturation (Exercise) 94 % 95 % 88 % 96 % 92 %   Oxygen Saturation (  Exit) 97 % 97 % 98 % 97 % 96 %   Rating of Perceived Exertion (Exercise) 15 15 13 13 14    Perceived Dyspnea (Exercise) 3 0 3 2 2    Symptoms SOB, fatigue none SOB SOB SOB   Comments walk test results 1st full day of exercise -- -- --   Duration -- Progress to 30 minutes of  aerobic without signs/symptoms of physical distress Progress to 30 minutes of  aerobic without signs/symptoms of physical distress Progress to 30 minutes of  aerobic without signs/symptoms of physical distress Progress to 30 minutes of  aerobic without signs/symptoms of physical distress   Intensity -- THRR unchanged THRR unchanged  THRR unchanged THRR unchanged     Progression   Progression -- Continue to progress workloads to maintain intensity without signs/symptoms of physical distress. Continue to progress workloads to maintain intensity without signs/symptoms of physical distress. Continue to progress workloads to maintain intensity without signs/symptoms of physical distress. Continue to progress workloads to maintain intensity without signs/symptoms of physical distress.   Average METs -- 2.07 2.09 2.31 2.12     Resistance Training   Training Prescription -- Yes Yes Yes Yes   Weight -- 2 lb 2 lb 2 lb 2 lb   Reps -- 10-15 10-15 10-15 10-15     Interval Training   Interval Training -- No No No No     Recumbant Bike   Level -- -- 1 -- --   Watts -- -- 11 -- --   Minutes -- -- 15 -- --     NuStep   Level -- -- 1 1 1    Minutes -- -- 15 15 15    METs -- -- -- -- 1.6     Biostep-RELP   Level -- 1 -- 1 2   Minutes -- 15 -- 15 15   METs -- 2 -- 2 2     Track   Laps -- 21 24 30 30    Minutes -- 15 15 15 15    METs -- 2.14 2.31 2.63 2.63     Oxygen   Maintain Oxygen Saturation -- 88% or higher 88% or higher 88% or higher 88% or higher    Row Name 10/22/22 0800             Response to Exercise   Blood Pressure (Admit) 118/62       Blood Pressure (Exit) 114/58       Heart Rate (Admit) 71 bpm       Heart Rate (Exercise) 96 bpm       Heart Rate (Exit) 91 bpm       Oxygen Saturation (Admit) 96 %       Oxygen Saturation (Exercise) 92 %       Oxygen Saturation (Exit) 96 %       Rating of Perceived Exertion (Exercise) 15       Perceived Dyspnea (Exercise) 3       Symptoms SOB       Duration Progress to 30 minutes of  aerobic without signs/symptoms of physical distress       Intensity THRR unchanged         Progression   Progression Continue to progress workloads to maintain intensity without signs/symptoms of physical distress.       Average METs 2.17         Resistance Training   Training  Prescription Yes       Weight 2 lb  Reps 10-15         Interval Training   Interval Training No         NuStep   Level 2       Minutes 15       METs 2         Arm Ergometer   Level 1       Minutes 15       METs 1.6         Biostep-RELP   Level 2       Minutes 15       METs 2         Track   Laps 30       Minutes 15       METs 2.63         Oxygen   Maintain Oxygen Saturation 88% or higher                Exercise Comments:   Exercise Comments     Row Name 08/25/22 1536           Exercise Comments First full day of exercise!  Patient was oriented to gym and equipment including functions, settings, policies, and procedures.  Patient's individual exercise prescription and treatment plan were reviewed.  All starting workloads were established based on the results of the 6 minute walk test done at initial orientation visit.  The plan for exercise progression was also introduced and progression will be customized based on patient's performance and goals.                Exercise Goals and Review:   Exercise Goals     Row Name 08/19/22 1636             Exercise Goals   Increase Physical Activity Yes       Intervention Provide advice, education, support and counseling about physical activity/exercise needs.;Develop an individualized exercise prescription for aerobic and resistive training based on initial evaluation findings, risk stratification, comorbidities and participant's personal goals.       Expected Outcomes Short Term: Attend rehab on a regular basis to increase amount of physical activity.;Long Term: Add in home exercise to make exercise part of routine and to increase amount of physical activity.;Long Term: Exercising regularly at least 3-5 days a week.       Increase Strength and Stamina Yes       Intervention Provide advice, education, support and counseling about physical activity/exercise needs.;Develop an individualized exercise  prescription for aerobic and resistive training based on initial evaluation findings, risk stratification, comorbidities and participant's personal goals.       Expected Outcomes Short Term: Perform resistance training exercises routinely during rehab and add in resistance training at home;Short Term: Increase workloads from initial exercise prescription for resistance, speed, and METs.;Long Term: Improve cardiorespiratory fitness, muscular endurance and strength as measured by increased METs and functional capacity ( )       Able to understand and use rate of perceived exertion (RPE) scale Yes       Intervention Provide education and explanation on how to use RPE scale       Expected Outcomes Short Term: Able to use RPE daily in rehab to express subjective intensity level;Long Term:  Able to use RPE to guide intensity level when exercising independently       Able to understand and use Dyspnea scale Yes       Intervention Provide education and explanation on how  to use Dyspnea scale       Expected Outcomes Short Term: Able to use Dyspnea scale daily in rehab to express subjective sense of shortness of breath during exertion;Long Term: Able to use Dyspnea scale to guide intensity level when exercising independently       Knowledge and understanding of Target Heart Rate Range (THRR) Yes       Intervention Provide education and explanation of THRR including how the numbers were predicted and where they are located for reference       Expected Outcomes Short Term: Able to state/look up THRR;Long Term: Able to use THRR to govern intensity when exercising independently;Short Term: Able to use daily as guideline for intensity in rehab       Able to check pulse independently Yes       Intervention Provide education and demonstration on how to check pulse in carotid and radial arteries.;Review the importance of being able to check your own pulse for safety during independent exercise       Expected Outcomes  Long Term: Able to check pulse independently and accurately;Short Term: Able to explain why pulse checking is important during independent exercise       Understanding of Exercise Prescription Yes       Intervention Provide education, explanation, and written materials on patient's individual exercise prescription       Expected Outcomes Short Term: Able to explain program exercise prescription;Long Term: Able to explain home exercise prescription to exercise independently                Exercise Goals Re-Evaluation :  Exercise Goals Re-Evaluation     Row Name 08/25/22 1538 08/26/22 0932 09/06/22 1401 09/15/22 1622 09/21/22 0719     Exercise Goal Re-Evaluation   Exercise Goals Review Increase Physical Activity;Able to understand and use rate of perceived exertion (RPE) scale;Knowledge and understanding of Target Heart Rate Range (THRR);Understanding of Exercise Prescription;Increase Strength and Stamina;Able to understand and use Dyspnea scale;Able to check pulse independently Increase Physical Activity;Increase Strength and Stamina;Understanding of Exercise Prescription Increase Physical Activity;Increase Strength and Stamina;Understanding of Exercise Prescription Increase Physical Activity;Increase Strength and Stamina;Understanding of Exercise Prescription Increase Physical Activity;Increase Strength and Stamina;Understanding of Exercise Prescription   Comments Reviewed RPE scale, THR and program prescription with pt today.  Pt voiced understanding and was given a copy of goals to take home. Ann did well for her first session here at rehab. She was able to complete 21 laps on the track! Her RPEs are appropriate and O2 saturations stayed above 88%. We will continue to monitor as she progresses in the program. Dewayne Hatch is doing well in rehab. She recently increased her number of laps on the track up to 24 laps. She also has continued to work at level 1 on the T4 nustep and recumbent bike. She has done  well with 2 lb hand weights for resistance training as well. We will continue to monitor her progress in the program. Dewayne Hatch is up to 27 laps on the track. She would like to do the XR next time she is in class. She states that the biostep is very hard for her. Dewayne Hatch is doing well in rehab.  She is up to 30 laps.  Her saturations are still doing well.  We will encourage her to try to increased seated equipment to level 2.  We will continue to monitor her progress.   Expected Outcomes Short: Use RPE daily to regulate intensity.  Long: Follow program prescription  in THR. Short: Continue initial exercise prescription Long: Build up overall strength and stamina Short: Begin to progressively increase workloads. Long: Continue to improve strength and stamina. Short: work on the XR. Long: maintain exercise post [facility]. Short: Increase seated workloads Long: Conitnue to improve stamina.    Row Name 10/04/22 1421 10/22/22 0818           Exercise Goal Re-Evaluation   Exercise Goals Review Increase Physical Activity;Increase Strength and Stamina;Understanding of Exercise Prescription Increase Physical Activity;Increase Strength and Stamina;Understanding of Exercise Prescription      Comments Dewayne Hatch is doing well in rehab. She continues to walk up to 30 laps on the track. She also continues to work at level 1 on the T4 nustep and improved to level 2 on the biostep. She continues to do well with 2 lb hand weights for resistance training as well. We will continue to monitor her progress in the program. Dewayne Hatch is doing well in rehab. She continues to work at level 1 on the arm crank and level 2 on the biostep. She did improve to level 2 on the T4 nustep. She also continues to walk up to 30 laps on the track. We will continue to monitor her progress in the program.      Expected Outcomes Short: Increase T4 nustep to level 2. Long: Continue to improve strength and stamina. Short: Continue to walk 30 laps consistently. Long:  Continue to improve strength and stamina.               Discharge Exercise Prescription (Final Exercise Prescription Changes):  Exercise Prescription Changes - 10/22/22 0800       Response to Exercise   Blood Pressure (Admit) 118/62    Blood Pressure (Exit) 114/58    Heart Rate (Admit) 71 bpm    Heart Rate (Exercise) 96 bpm    Heart Rate (Exit) 91 bpm    Oxygen Saturation (Admit) 96 %    Oxygen Saturation (Exercise) 92 %    Oxygen Saturation (Exit) 96 %    Rating of Perceived Exertion (Exercise) 15    Perceived Dyspnea (Exercise) 3    Symptoms SOB    Duration Progress to 30 minutes of  aerobic without signs/symptoms of physical distress    Intensity THRR unchanged      Progression   Progression Continue to progress workloads to maintain intensity without signs/symptoms of physical distress.    Average METs 2.17      Resistance Training   Training Prescription Yes    Weight 2 lb    Reps 10-15      Interval Training   Interval Training No      NuStep   Level 2    Minutes 15    METs 2      Arm Ergometer   Level 1    Minutes 15    METs 1.6      Biostep-RELP   Level 2    Minutes 15    METs 2      Track   Laps 30    Minutes 15    METs 2.63      Oxygen   Maintain Oxygen Saturation 88% or higher             Nutrition:  Target Goals: Understanding of nutrition guidelines, daily intake of sodium 1500mg , cholesterol 200mg , calories 30% from fat and 7% or less from saturated fats, daily to have 5 or more servings of fruits and vegetables.  Education: All  About Nutrition: -Group instruction provided by verbal, written material, interactive activities, discussions, models, and posters to present general guidelines for heart healthy nutrition including fat, fiber, MyPlate, the role of sodium in heart healthy nutrition, utilization of the nutrition label, and utilization of this knowledge for meal planning. Follow up email sent as well. Written material given  at graduation. Flowsheet Row Pulmonary Rehab from 09/15/2022 in [facility] Cardiac and Pulmonary Rehab  Education need identified 08/19/22       Biometrics:  Pre Biometrics - 08/19/22 1624       Pre Biometrics   Height 5\' 8"  (1.727 m)    Weight 134 lb (60.8 kg)    Waist Circumference 31 inches    Hip Circumference 38 inches    Waist to Hip Ratio 0.82 %    BMI (Calculated) 20.38    Single Leg Stand 2.5 seconds              Nutrition Therapy Plan and Nutrition Goals:  Nutrition Therapy & Goals - 08/19/22 1616       Intervention Plan   Intervention Prescribe, educate and counsel regarding individualized specific dietary modifications aiming towards targeted core components such as weight, hypertension, lipid management, diabetes, heart failure and other comorbidities.    Expected Outcomes Short Term Goal: Understand basic principles of dietary content, such as calories, fat, sodium, cholesterol and nutrients.;Short Term Goal: A plan has been developed with personal nutrition goals set during dietitian appointment.;Long Term Goal: Adherence to prescribed nutrition plan.             Nutrition Assessments:  MEDIFICTS Score Key: ?70 Need to make dietary changes  40-70 Heart Healthy Diet ? 40 Therapeutic Level Cholesterol Diet  Flowsheet Row Pulmonary Rehab from 08/19/2022 in [facility] Cardiac and Pulmonary Rehab  Picture Your Plate Total Score on Admission 44      Picture Your Plate Scores: <16 Unhealthy dietary pattern with much room for improvement. 41-50 Dietary pattern unlikely to meet recommendations for good health and room for improvement. 51-60 More healthful dietary pattern, with some room for improvement.  >60 Healthy dietary pattern, although there may be some specific behaviors that could be improved.   Nutrition Goals Re-Evaluation:  Nutrition Goals Re-Evaluation     Row Name 09/15/22 1612             Goals   Current Weight 137 lb (62.1 kg)        Nutrition Goal Eat smaller portions       Comment Patient was informed on why it is important to maintain a balanced diet when dealing with Respiratory issues. Explained that it takes a lot of energy to breath and when they are short of breath often they will need to have a good diet to help keep up with the calories they are expending for breathing.       Expected Outcome Short: Choose and plan snacks accordingly to patients caloric intake to improve breathing. Long: Maintain a diet independently that meets their caloric intake to aid in daily shortness of breath.                Nutrition Goals Discharge (Final Nutrition Goals Re-Evaluation):  Nutrition Goals Re-Evaluation - 09/15/22 1612       Goals   Current Weight 137 lb (62.1 kg)    Nutrition Goal Eat smaller portions    Comment Patient was informed on why it is important to maintain a balanced diet when dealing with Respiratory issues. Explained that  it takes a lot of energy to breath and when they are short of breath often they will need to have a good diet to help keep up with the calories they are expending for breathing.    Expected Outcome Short: Choose and plan snacks accordingly to patients caloric intake to improve breathing. Long: Maintain a diet independently that meets their caloric intake to aid in daily shortness of breath.             Psychosocial: Target Goals: Acknowledge presence or absence of significant depression and/or stress, maximize coping skills, provide positive support system. Participant is able to verbalize types and ability to use techniques and skills needed for reducing stress and depression.   Education: Stress, Anxiety, and Depression - Group verbal and visual presentation to define topics covered.  Reviews how body is impacted by stress, anxiety, and depression.  Also discusses healthy ways to reduce stress and to treat/manage anxiety and depression.  Written material given at  graduation. Flowsheet Row Pulmonary Rehab from 09/15/2022 in [facility] Cardiac and Pulmonary Rehab  Date 09/08/22  Educator KW  Instruction Review Code 1- [employer] Understanding       Education: Sleep Hygiene -Provides group verbal and written instruction about how sleep can affect your health.  Define sleep hygiene, discuss sleep cycles and impact of sleep habits. Review good sleep hygiene tips.    Initial Review & Psychosocial Screening:  Initial Psych Review & Screening - 08/12/22 1510       Initial Review   Current issues with Current Anxiety/Panic;Current Psychotropic Meds   zoloft  as needed for anxiety     Family Dynamics   Good Support System? Yes   Marianne Sofia in law and grand daughter  great grandson 72     Barriers   Psychosocial barriers to participate in program There are no identifiable barriers or psychosocial needs.      Screening Interventions   Interventions Encouraged to exercise;To provide support and resources with identified psychosocial needs;Provide feedback about the scores to participant    Expected Outcomes Short Term goal: Utilizing psychosocial counselor, staff and physician to assist with identification of specific Stressors or current issues interfering with healing process. Setting desired goal for each stressor or current issue identified.;Long Term Goal: Stressors or current issues are controlled or eliminated.;Short Term goal: Identification and review with participant of any Quality of Life or Depression concerns found by scoring the questionnaire.;Long Term goal: The participant improves quality of Life and PHQ9 Scores as seen by post scores and/or verbalization of changes             Quality of Life Scores:  Scores of 19 and below usually indicate a poorer quality of life in these areas.  A difference of  2-3 points is a clinically meaningful difference.  A difference of 2-3 points in the total score of the Quality of Life Index has been  associated with significant improvement in overall quality of life, self-image, physical symptoms, and general health in studies assessing change in quality of life.  PHQ-9: Review Flowsheet       09/20/2022 09/15/2022 08/19/2022  Depression screen PHQ 2/9  Decreased Interest 1 0 2  Down, Depressed, Hopeless 1 1 1   PHQ - 2 Score 2 1 3   Altered sleeping 0 0 0  Tired, decreased energy 0 0 2  Change in appetite 0 0 3  Feeling bad or failure about yourself  0 0 3  Trouble concentrating 0 0  0  Moving slowly or fidgety/restless 0 0 0  Suicidal thoughts 0 0 1  PHQ-9 Score 2 1 12   Difficult doing work/chores Not difficult at all Not difficult at all Somewhat difficult   Interpretation of Total Score  Total Score Depression Severity:  1-4 = Minimal depression, 5-9 = Mild depression, 10-14 = Moderate depression, 15-19 = Moderately severe depression, 20-27 = Severe depression   Psychosocial Evaluation and Intervention:  Psychosocial Evaluation - 08/12/22 1634       Psychosocial Evaluation & Interventions   Interventions Encouraged to exercise with the program and follow exercise prescription    Comments Dewayne Hatch is ready to start the program.  She does not have transportation. She was advised  about and given the [facility] information and phone number.  She lives alone and has support from friends. She would like to see improvement with her shortness of breath. She will plan on attending 2 days aweek.    Expected Outcomes STG Dewayne Hatch will be able to attend all scheduled sessions and work on her exercise progression and improve her breathing with ADLs.  LTG Dewayne Hatch will be able to continue to wrok on her exercise progression and improved breathing    Continue Psychosocial Services  Follow up required by staff             Psychosocial Re-Evaluation:  Psychosocial Re-Evaluation     Row Name 09/15/22 1619             Psychosocial Re-Evaluation   Current issues with Current Stress Concerns        Comments Reviewed patient health questionnaire (PHQ-9) with patient for follow up. Previously, patients score indicated signs/symptoms of depression.  Reviewed to see if patient is improving symptom wise while in program.  Score improved and patient states that it is because she is able to exersise more.       Expected Outcomes Short: Continue to attend [facility] regularly for regular exercise and social engagement. Long: Continue to improve symptoms and manage a positive mental state.       Interventions Encouraged to attend Pulmonary Rehabilitation for the exercise       Continue Psychosocial Services  Follow up required by staff                Psychosocial Discharge (Final Psychosocial Re-Evaluation):  Psychosocial Re-Evaluation - 09/15/22 1619       Psychosocial Re-Evaluation   Current issues with Current Stress Concerns    Comments Reviewed patient health questionnaire (PHQ-9) with patient for follow up. Previously, patients score indicated signs/symptoms of depression.  Reviewed to see if patient is improving symptom wise while in program.  Score improved and patient states that it is because she is able to exersise more.    Expected Outcomes Short: Continue to attend [facility] regularly for regular exercise and social engagement. Long: Continue to improve symptoms and manage a positive mental state.    Interventions Encouraged to attend Pulmonary Rehabilitation for the exercise    Continue Psychosocial Services  Follow up required by staff             Education: Education Goals: Education classes will be provided on a weekly basis, covering required topics. Participant will state understanding/return demonstration of topics presented.  Learning Barriers/Preferences:   General Pulmonary Education Topics:  Infection Prevention: - Provides verbal and written material to individual with discussion of infection control including proper hand washing and proper equipment cleaning  during exercise session. Flowsheet Row Pulmonary Rehab from  09/15/2022 in [facility] Cardiac and Pulmonary Rehab  Education need identified 08/19/22  Date 08/19/22  Educator KW  Instruction Review Code 1- Verbalizes Understanding       Falls Prevention: - Provides verbal and written material to individual with discussion of falls prevention and safety. Flowsheet Row Pulmonary Rehab [facility] Pulmonary Rehab  Date 08/12/22  Educator SB  Instruction Review Code 1- Verbalizes Understanding       Chronic Lung Disease Review: - Group verbal instruction with posters, models, PowerPoint presentations and videos,  to review new updates, new respiratory medications, new advancements in procedures and treatments. Providing information on websites and "800" numbers for continued self-education. Includes information about supplement oxygen, available portable oxygen systems, continuous and intermittent flow rates, oxygen safety, concentrators, and Medicare reimbursement for oxygen. Explanation of Pulmonary Drugs, including class, frequency, complications, importance of spacers, rinsing mouth after steroid MDI's, and proper cleaning methods for nebulizers. Review of basic lung anatomy and physiology related to function, structure, and complications of lung disease. Review of risk factors. Discussion about methods for diagnosing sleep apnea and types of masks and machines for OSA. Includes a review of the use of types of environmental controls: home humidity, furnaces, filters, dust mite/pet prevention, HEPA vacuums. Discussion about weather changes, air quality and the benefits of nasal washing. Instruction on Warning signs, infection symptoms, calling MD promptly, preventive modes, and value of vaccinations. Review of effective airway clearance, coughing and/or vibration techniques. Emphasizing that all should Create an Action Plan. Written material given at graduation. Flowsheet Row  Pulmonary Rehab from 09/15/2022 in [facility] Cardiac and Pulmonary Rehab  Education need identified 08/19/22  Date 09/01/22  Educator [facility]  Instruction Review Code 1- Verbalizes Understanding       AED/CPR: - Group verbal and written instruction with the use of models to demonstrate the basic use of the AED with the basic ABC's of resuscitation.    Anatomy and Cardiac Procedures: - Group verbal and visual presentation and models provide information about basic cardiac anatomy and function. Reviews the testing methods done to diagnose heart disease and the outcomes of the test results. Describes the treatment choices: Medical Management, Angioplasty, or Coronary Bypass Surgery for treating various heart conditions including Myocardial Infarction, Angina, Valve Disease, and Cardiac Arrhythmias.  Written material given at graduation.   Medication Safety: - Group verbal and visual instruction to review commonly prescribed medications for heart and lung disease. Reviews the medication, class of the drug, and side effects. Includes the steps to properly store meds and maintain the prescription regimen.  Written material given at graduation.   Other: -Provides group and verbal instruction on various topics (see comments)   Knowledge Questionnaire Score:  Knowledge Questionnaire Score - 08/19/22 1611       Knowledge Questionnaire Score   Pre Score 15/18              Core Components/Risk Factors/Patient Goals at Admission:  Personal Goals and Risk Factors at Admission - 08/19/22 1636       Core Components/Risk Factors/Patient Goals on Admission    Weight Management Yes;Weight Gain   Per pt request, had lost some weight and would like to regain some back   Intervention Weight Management: Develop a combined nutrition and exercise program designed to reach desired caloric intake, while maintaining appropriate intake of nutrient and fiber, sodium and fats, and appropriate energy expenditure  required for the weight goal.;Weight Management: Provide education and appropriate resources to help participant work  on and attain dietary goals.    Admit Weight 134 lb (60.8 kg)    Goal Weight: Short Term 137 lb (62.1 kg)    Goal Weight: Long Term 140 lb (63.5 kg)    Expected Outcomes Short Term: Continue to assess and modify interventions until short term weight is achieved;Long Term: Adherence to nutrition and physical activity/exercise program aimed toward attainment of established weight goal;Weight Gain: Understanding of general recommendations for a high calorie, high protein meal plan that promotes weight gain by distributing calorie intake throughout the day with the consumption for 4-5 meals, snacks, and/or supplements;Understanding recommendations for meals to include 15-35% energy as protein, 25-35% energy from fat, 35-60% energy from carbohydrates, less than 200mg  of dietary cholesterol, 20-35 gm of total fiber daily;Understanding of distribution of calorie intake throughout the day with the consumption of 4-5 meals/snacks    Improve shortness of breath with ADL's Yes    Intervention Provide education, individualized exercise plan and daily activity instruction to help decrease symptoms of SOB with activities of daily living.    Expected Outcomes Short Term: Improve cardiorespiratory fitness to achieve a reduction of symptoms when performing ADLs;Long Term: Be able to perform more ADLs without symptoms or delay the onset of symptoms    Increase knowledge of respiratory medications and ability to use respiratory devices properly  Yes    Intervention Provide education and demonstration as needed of appropriate use of medications, inhalers, and oxygen therapy.    Expected Outcomes Short Term: Achieves understanding of medications use. Understands that oxygen is a medication prescribed by physician. Demonstrates appropriate use of inhaler and oxygen therapy.;Long Term: Maintain appropriate use of  medications, inhalers, and oxygen therapy.    Hypertension Yes    Intervention Provide education on lifestyle modifcations including regular physical activity/exercise, weight management, moderate sodium restriction and increased consumption of fresh fruit, vegetables, and low fat dairy, alcohol moderation, and smoking cessation.;Monitor prescription use compliance.    Expected Outcomes Short Term: Continued assessment and intervention until BP is < 140/30mm HG in hypertensive participants. < 130/74mm HG in hypertensive participants with diabetes, heart failure or chronic kidney disease.;Long Term: Maintenance of blood pressure at goal levels.             Education:Diabetes - Individual verbal and written instruction to review signs/symptoms of diabetes, desired ranges of glucose level fasting, after meals and with exercise. Acknowledge that pre and post exercise glucose checks will be done for 3 sessions at entry of program.   Know Your Numbers and Heart Failure: - Group verbal and visual instruction to discuss disease risk factors for cardiac and pulmonary disease and treatment options.  Reviews associated critical values for Overweight/Obesity, Hypertension, Cholesterol, and Diabetes.  Discusses basics of heart failure: signs/symptoms and treatments.  Introduces Heart Failure Zone chart for action plan for heart failure.  Written material given at graduation. Flowsheet Row Pulmonary Rehab from 09/15/2022 in [facility] Cardiac and Pulmonary Rehab  Date 08/25/22  Educator MS  Instruction Review Code 1- Verbalizes Understanding       Core Components/Risk Factors/Patient Goals Review:   Goals and Risk Factor Review     Row Name 09/15/22 1611             Core Components/Risk Factors/Patient Goals Review   Personal Goals Review Improve shortness of breath with ADL's       Review Spoke to patient about their shortness of breath and what they can do to improve. Patient has been informed of  breathing  techniques when starting the program. Patient is informed to tell staff if they have had any med changes and that certain meds they are taking or not taking can be causing shortness of breath.       Expected Outcomes Short: Attend [facility] regularly to improve shortness of breath with ADL's. Long: maintain independence with ADL's                Core Components/Risk Factors/Patient Goals at Discharge (Final Review):   Goals and Risk Factor Review - 09/15/22 1611       Core Components/Risk Factors/Patient Goals Review   Personal Goals Review Improve shortness of breath with ADL's    Review Spoke to patient about their shortness of breath and what they can do to improve. Patient has been informed of breathing techniques when starting the program. Patient is informed to tell staff if they have had any med changes and that certain meds they are taking or not taking can be causing shortness of breath.    Expected Outcomes Short: Attend [facility] regularly to improve shortness of breath with ADL's. Long: maintain independence with ADL's             ITP Comments:  ITP Comments     Row Name 08/12/22 1633 08/19/22 1608 08/25/22 1536 09/01/22 0939 09/29/22 1131   ITP Comments Virtual orientation call completed today. shehas an appointment on Date: 5/22024  for EP eval and gym Orientation.  Documentation of diagnosis can be found in C HLDate: 07/07/2022 . Completed and gym orientation. Initial ITP created and sent for review to Dr. Vida Rigger, Medical Director. First full day of exercise!  Patient was oriented to gym and equipment including functions, settings, policies, and procedures.  Patient's individual exercise prescription and treatment plan were reviewed.  All starting workloads were established based on the results of the 6 minute walk test done at initial orientation visit.  The plan for exercise progression was also introduced and progression will be customized based on  patient's performance and goals. 30 Day review completed. Medical Director ITP review done, changes made as directed, and signed approval by Medical Director.    new to program 30 Day review completed. Medical Director ITP review done, changes made as directed, and signed approval by Medical Director.    Row Name 10/26/22 1504           ITP Comments 30 Day review completed. Medical Director ITP review done, changes made as directed, and signed approval by Medical Director.                Comments:

## 2022-10-27 ENCOUNTER — Encounter: Payer: Medicare PPO | Admitting: *Deleted

## 2022-10-27 DIAGNOSIS — J449 Chronic obstructive pulmonary disease, unspecified: Secondary | ICD-10-CM

## 2022-10-27 NOTE — Progress Notes (Signed)
Daily Session Note  Patient Details  Name: Bridget Deleon MRN: [id number] Date of Birth: [birth year] Referring Provider:   Flowsheet Row Pulmonary Rehab from 08/19/2022 in [facility] Cardiac and Pulmonary Rehab  Referring Provider Vida Rigger MD       Encounter Date: 10/27/2022  Check In:  Session Check In - 10/27/22 1541       Check-In   Supervising physician immediately available to respond to emergencies See telemetry face sheet for immediately available ER MD    Location [facility]-Cardiac & Pulmonary Rehab    Staff Present Elige Ko, RCP,RRT,BSRT;Laureen Manson Passey, BS, RRT, CPFT;Avaley Coop Jewel Baize, RN BSN    Virtual Visit No    Medication changes reported     No    Fall or balance concerns reported    No    Tobacco Cessation No Change    Warm-up and Cool-down Performed on first and last piece of equipment    Resistance Training Performed Yes    VAD Patient? No    PAD/SET Patient? No      Pain Assessment   Currently in Pain? No/denies                Social History   Tobacco Use  Smoking Status Former   Packs/day: 1.00   Years: 55.00   Additional pack years: 0.00   Total pack years: 55.00   Types: Cigarettes   Quit date: 2009   Years since quitting: 15.5  Smokeless Tobacco Never    Goals Met:  Independence with exercise equipment Exercise tolerated well No report of concerns or symptoms today Strength training completed today  Goals Unmet:  Not Applicable  Comments: Pt able to follow exercise prescription today without complaint.  Will continue to monitor for progression.    Dr. Bethann Punches is Medical Director for [facility] Cardiac Rehabilitation.  Dr. Vida Rigger is Medical Director for [facility] Pulmonary Rehabilitation.

## 2022-11-01 ENCOUNTER — Encounter: Payer: Medicare PPO | Admitting: *Deleted

## 2022-11-01 DIAGNOSIS — J449 Chronic obstructive pulmonary disease, unspecified: Secondary | ICD-10-CM

## 2022-11-01 NOTE — Progress Notes (Signed)
Daily Session Note  Patient Details  Name: Bridget Deleon MRN: [id number] Date of Birth: 12-Jul-1940 Referring Provider:   Flowsheet Row Pulmonary Rehab from 08/19/2022 in [facility] Cardiac and Pulmonary Rehab  Referring Provider Vida Rigger MD       Encounter Date: 11/01/2022  Check In:  Session Check In - 11/01/22 1548       Check-In   Supervising physician immediately available to respond to emergencies See telemetry face sheet for immediately available ER MD    Location [facility]-Cardiac & Pulmonary Rehab    Staff Present Susann Givens, RN BSN;Laureen Manson Passey, BS, RRT, CPFT;Kelly Madilyn Fireman, BS, ACSM CEP, Exercise Physiologist    Virtual Visit No    Medication changes reported     No    Fall or balance concerns reported    No    Warm-up and Cool-down Performed on first and last piece of equipment    Resistance Training Performed Yes    VAD Patient? No    PAD/SET Patient? No      Pain Assessment   Currently in Pain? No/denies                Social History   Tobacco Use  Smoking Status Former   Current packs/day: 0.00   Average packs/day: 1 pack/day for 55.0 years (55.0 ttl pk-yrs)   Types: Cigarettes   Start date: 30   Quit date: 2009   Years since quitting: 15.5  Smokeless Tobacco Never    Goals Met:  Independence with exercise equipment Exercise tolerated well No report of concerns or symptoms today Strength training completed today  Goals Unmet:  Not Applicable  Comments: Pt able to follow exercise prescription today without complaint.  Will continue to monitor for progression.    Dr. Bethann Punches is Medical Director for [facility] Cardiac Rehabilitation.  Dr. Vida Rigger is Medical Director for [facility] Pulmonary Rehabilitation.

## 2022-11-03 ENCOUNTER — Encounter: Payer: Medicare PPO | Admitting: *Deleted

## 2022-11-03 DIAGNOSIS — J449 Chronic obstructive pulmonary disease, unspecified: Secondary | ICD-10-CM

## 2022-11-03 NOTE — Progress Notes (Signed)
Daily Session Note  Patient Details  Name: Bridget Deleon MRN: [id number] Date of Birth: August 08, 1940 Referring Provider:   Flowsheet Row Pulmonary Rehab from 08/19/2022 in [facility] Cardiac and Pulmonary Rehab  Referring Provider Vida Rigger MD       Encounter Date: 11/03/2022  Check In:  Session Check In - 11/03/22 1535       Check-In   Supervising physician immediately available to respond to emergencies See telemetry face sheet for immediately available ER MD    Location [facility]-Cardiac & Pulmonary Rehab    Staff Present Hulen Luster, BS, RRT, CPFT;Tyra Gural Jewel Baize, RN BSN;Joseph Reino Kent, RCP,RRT,BSRT    Virtual Visit No    Medication changes reported     No    Fall or balance concerns reported    No    Warm-up and Cool-down Performed on first and last piece of equipment    Resistance Training Performed Yes    VAD Patient? No    PAD/SET Patient? No      Pain Assessment   Currently in Pain? No/denies                Social History   Tobacco Use  Smoking Status Former   Current packs/day: 0.00   Average packs/day: 1 pack/day for 55.0 years (55.0 ttl pk-yrs)   Types: Cigarettes   Start date: 1   Quit date: 2009   Years since quitting: 15.5  Smokeless Tobacco Never    Goals Met:  Independence with exercise equipment Exercise tolerated well No report of concerns or symptoms today Strength training completed today  Goals Unmet:  Not Applicable  Comments: Pt able to follow exercise prescription today without complaint.  Will continue to monitor for progression.    Dr. Bethann Punches is Medical Director for [facility] Cardiac Rehabilitation.  Dr. Vida Rigger is Medical Director for [facility] Pulmonary Rehabilitation.

## 2022-11-08 ENCOUNTER — Encounter: Payer: Medicare PPO | Admitting: *Deleted

## 2022-11-08 DIAGNOSIS — J449 Chronic obstructive pulmonary disease, unspecified: Secondary | ICD-10-CM | POA: Diagnosis not present

## 2022-11-08 NOTE — Progress Notes (Signed)
Daily Session Note  Patient Details  Name: Bridget Deleon MRN: [id number] Date of Birth: [birth year] Referring Provider:   Flowsheet Row Pulmonary Rehab from 08/19/2022 in [facility] Cardiac and Pulmonary Rehab  Referring Provider Vida Rigger MD       Encounter Date: 11/08/2022  Check In:  Session Check In - 11/08/22 1542       Check-In   Supervising physician immediately available to respond to emergencies See telemetry face sheet for immediately available ER MD    Location [facility]-Cardiac & Pulmonary Rehab    Staff Present Susann Givens, RN BSN;Joseph Nuangola, RCP,RRT,BSRT;Laureen [place], [place], RRT, CPFT    Virtual Visit No    Medication changes reported     No    Fall or balance concerns reported    No    Warm-up and Cool-down Performed on first and last piece of equipment    Resistance Training Performed Yes    VAD Patient? No    PAD/SET Patient? No      Pain Assessment   Currently in Pain? No/denies                Social History   Tobacco Use  Smoking Status Former   Current packs/day: 0.00   Average packs/day: 1 pack/day for 55.0 years (55.0 ttl pk-yrs)   Types: Cigarettes   Start date: 10   Quit date: 2009   Years since quitting: 15.5  Smokeless Tobacco Never    Goals Met:  Independence with exercise equipment Exercise tolerated well No report of concerns or symptoms today Strength training completed today  Goals Unmet:  Not Applicable  Comments: Pt able to follow exercise prescription today without complaint.  Will continue to monitor for progression.    Dr. Bethann Punches is Medical Director for [facility] Cardiac Rehabilitation.  Dr. Vida Rigger is Medical Director for [facility] Pulmonary Rehabilitation.

## 2022-11-10 ENCOUNTER — Encounter: Payer: Medicare PPO | Admitting: *Deleted

## 2022-11-10 DIAGNOSIS — J449 Chronic obstructive pulmonary disease, unspecified: Secondary | ICD-10-CM | POA: Diagnosis not present

## 2022-11-10 NOTE — Progress Notes (Signed)
Daily Session Note  Patient Details  Name: Bridget Deleon MRN: [id number] Date of Birth: 1940-11-04 Referring Provider:   Flowsheet Row Pulmonary Rehab from 08/19/2022 in [facility] Cardiac and Pulmonary Rehab  Referring Provider Vida Rigger MD       Encounter Date: 11/10/2022  Check In:  Session Check In - 11/10/22 1533       Check-In   Supervising physician immediately available to respond to emergencies See telemetry face sheet for immediately available ER MD    Location [facility]-Cardiac & Pulmonary Rehab    Staff Present Susann Givens, RN Atilano Median, RN, ADN   Maggie Best and Max Conetta   Virtual Visit No    Medication changes reported     No    Fall or balance concerns reported    No    Warm-up and Cool-down Performed on first and last piece of equipment    Resistance Training Performed Yes    VAD Patient? No    PAD/SET Patient? No      Pain Assessment   Currently in Pain? No/denies                Social History   Tobacco Use  Smoking Status Former   Current packs/day: 0.00   Average packs/day: 1 pack/day for 55.0 years (55.0 ttl pk-yrs)   Types: Cigarettes   Start date: 76   Quit date: 2009   Years since quitting: 15.5  Smokeless Tobacco Never    Goals Met:  Independence with exercise equipment Exercise tolerated well No report of concerns or symptoms today Strength training completed today  Goals Unmet:  Not Applicable  Comments: Pt able to follow exercise prescription today without complaint.  Will continue to monitor for progression.    Dr. Bethann Punches is Medical Director for [facility] Cardiac Rehabilitation.  Dr. Vida Rigger is Medical Director for [facility] Pulmonary Rehabilitation.

## 2022-11-15 ENCOUNTER — Encounter: Payer: Medicare PPO | Admitting: *Deleted

## 2022-11-15 DIAGNOSIS — J449 Chronic obstructive pulmonary disease, unspecified: Secondary | ICD-10-CM | POA: Diagnosis not present

## 2022-11-15 NOTE — Progress Notes (Signed)
Daily Session Note  Patient Details  Name: Bridget Deleon MRN: [id number] Date of Birth: Nov 06, 1940 Referring Provider:   Flowsheet Row Pulmonary Rehab from 08/19/2022 in [facility] Cardiac and Pulmonary Rehab  Referring Provider Vida Rigger MD       Encounter Date: 11/15/2022  Check In:  Session Check In - 11/15/22 1530       Check-In   Supervising physician immediately available to respond to emergencies See telemetry face sheet for immediately available ER MD    Location [facility]-Cardiac & Pulmonary Rehab    Staff Present Susann Givens, RN BSN;Joseph Reino Kent, RCP,RRT,BSRT;Other   Max Conetta   Virtual Visit No    Medication changes reported     No    Fall or balance concerns reported    No    Warm-up and Cool-down Performed on first and last piece of equipment    Resistance Training Performed Yes    VAD Patient? No    PAD/SET Patient? No      Pain Assessment   Currently in Pain? No/denies                Social History   Tobacco Use  Smoking Status Former   Current packs/day: 0.00   Average packs/day: 1 pack/day for 55.0 years (55.0 ttl pk-yrs)   Types: Cigarettes   Start date: 73   Quit date: 2009   Years since quitting: 15.5  Smokeless Tobacco Never    Goals Met:  Independence with exercise equipment Exercise tolerated well No report of concerns or symptoms today Strength training completed today  Goals Unmet:  Not Applicable  Comments: Pt able to follow exercise prescription today without complaint.  Will continue to monitor for progression.    Dr. Bethann Punches is Medical Director for [facility] Cardiac Rehabilitation.  Dr. Vida Rigger is Medical Director for [facility] Pulmonary Rehabilitation.

## 2022-11-17 ENCOUNTER — Encounter: Payer: Medicare PPO | Admitting: *Deleted

## 2022-11-17 DIAGNOSIS — J449 Chronic obstructive pulmonary disease, unspecified: Secondary | ICD-10-CM | POA: Diagnosis not present

## 2022-11-17 NOTE — Progress Notes (Signed)
Daily Session Note  Patient Details  Name: Bridget Deleon MRN: [id number] Date of Birth: 30-May-1940 Referring Provider:   Flowsheet Row Pulmonary Rehab from 08/19/2022 in [facility] Cardiac and Pulmonary Rehab  Referring Provider Vida Rigger MD       Encounter Date: 11/17/2022  Check In:  Session Check In - 11/17/22 1531       Check-In   Supervising physician immediately available to respond to emergencies See telemetry face sheet for immediately available ER MD    Location [facility]-Cardiac & Pulmonary Rehab    Staff Present Susann Givens, RN BSN;Joseph Reino Kent, Guinevere Ferrari, RN, [place]    Virtual Visit No    Medication changes reported     No    Fall or balance concerns reported    No    Warm-up and Cool-down Performed on first and last piece of equipment    Resistance Training Performed Yes    VAD Patient? No    PAD/SET Patient? No      Pain Assessment   Currently in Pain? No/denies                Social History   Tobacco Use  Smoking Status Former   Current packs/day: 0.00   Average packs/day: 1 pack/day for 55.0 years (55.0 ttl pk-yrs)   Types: Cigarettes   Start date: 21   Quit date: 2009   Years since quitting: 15.5  Smokeless Tobacco Never    Goals Met:  Independence with exercise equipment Exercise tolerated well No report of concerns or symptoms today Strength training completed today  Goals Unmet:  Not Applicable  Comments: Pt able to follow exercise prescription today without complaint.  Will continue to monitor for progression.    Dr. Bethann Punches is Medical Director for [facility] Cardiac Rehabilitation.  Dr. Vida Rigger is Medical Director for [facility] Pulmonary Rehabilitation.

## 2022-11-24 ENCOUNTER — Encounter: Payer: Self-pay | Admitting: *Deleted

## 2022-11-24 ENCOUNTER — Encounter: Payer: Medicare PPO | Attending: Pulmonary Disease | Admitting: *Deleted

## 2022-11-24 DIAGNOSIS — Z5189 Encounter for other specified aftercare: Secondary | ICD-10-CM | POA: Insufficient documentation

## 2022-11-24 DIAGNOSIS — J449 Chronic obstructive pulmonary disease, unspecified: Secondary | ICD-10-CM | POA: Insufficient documentation

## 2022-11-24 DIAGNOSIS — Z87891 Personal history of nicotine dependence: Secondary | ICD-10-CM | POA: Diagnosis not present

## 2022-11-24 NOTE — Progress Notes (Signed)
Daily Session Note  Patient Details  Name: Bridget Deleon MRN: [id number] Date of Birth: 02-26-1941 Referring Provider:   Flowsheet Row Pulmonary Rehab from 08/19/2022 in [facility] Cardiac and Pulmonary Rehab  Referring Provider Vida Rigger MD       Encounter Date: 11/24/2022  Check In:  Session Check In - 11/24/22 1536       Check-In   Supervising physician immediately available to respond to emergencies See telemetry face sheet for immediately available ER MD    Location [facility]-Cardiac & Pulmonary Rehab    Staff Present Susann Givens, RN BSN;Megan Katrinka Blazing, RN, Bobetta Lime, RCP,RRT,BSRT    Virtual Visit No    Medication changes reported     No    Fall or balance concerns reported    No    Warm-up and Cool-down Performed on first and last piece of equipment    Resistance Training Performed Yes    VAD Patient? No    PAD/SET Patient? No      Pain Assessment   Currently in Pain? No/denies                Social History   Tobacco Use  Smoking Status Former   Current packs/day: 0.00   Average packs/day: 1 pack/day for 55.0 years (55.0 ttl pk-yrs)   Types: Cigarettes   Start date: 2   Quit date: 2009   Years since quitting: 15.6  Smokeless Tobacco Never    Goals Met:  Independence with exercise equipment Exercise tolerated well No report of concerns or symptoms today Strength training completed today  Goals Unmet:  Not Applicable  Comments: Pt able to follow exercise prescription today without complaint.  Will continue to monitor for progression.    Dr. Bethann Punches is Medical Director for [facility] Cardiac Rehabilitation.  Dr. Vida Rigger is Medical Director for [facility] Pulmonary Rehabilitation.

## 2022-11-24 NOTE — Progress Notes (Signed)
Pulmonary Individual Treatment Plan  Patient Details  Name: Bridget Deleon MRN: [id number] Date of Birth: 06-21-1940 Referring Provider:   Flowsheet Row Pulmonary Rehab from 08/19/2022 in [facility] Cardiac and Pulmonary Rehab  Referring Provider Vida Rigger MD       Initial Encounter Date:  Flowsheet Row Pulmonary Rehab from 08/19/2022 in [facility] Cardiac and Pulmonary Rehab  Date 08/19/22       Visit Diagnosis: Stage 2 moderate COPD by GOLD classification (HCC)  Patient's Home Medications on Admission:  Current Outpatient Medications:    alendronate (FOSAMAX) 70 MG tablet, Take 70 mg by mouth every 7 (seven) days. On Saturday.  Take with a full glass of water on an empty stomach., Disp: , Rfl:    ALPRAZolam (XANAX) 0.25 MG tablet, Take by mouth., Disp: , Rfl:    benzonatate (TESSALON) 200 MG capsule, Take 200 mg by mouth 3 (three) times daily as needed for cough., Disp: , Rfl:    Calcium Carbonate-Vitamin D (CALCIUM-D) 600-400 MG-UNIT TABS, Take 1 tablet by mouth every morning., Disp: , Rfl:    cholecalciferol (VITAMIN D) 1000 UNITS tablet, Take 1,000 Units by mouth every morning., Disp: , Rfl:    CRANBERRY PO, Take 1,500 mg by mouth every morning., Disp: , Rfl:    dicyclomine (BENTYL) 20 MG tablet, Take 20 mg by mouth 3 (three) times daily as needed for spasms., Disp: , Rfl:    diphenhydramine-acetaminophen (TYLENOL PM) 25-500 MG TABS tablet, Take 1 tablet by mouth at bedtime as needed., Disp: , Rfl:    famotidine (PEPCID) 20 MG tablet, Take by mouth., Disp: , Rfl:    fluticasone (FLONASE) 50 MCG/ACT nasal spray, Place 1 spray into both nostrils daily., Disp: , Rfl:    gentamicin cream (GARAMYCIN) 0.1 %, Apply 1 application topically 3 (three) times daily as needed., Disp: , Rfl:    guaiFENesin-dextromethorphan (ROBITUSSIN DM) 100-10 MG/5ML syrup, Take 5 mLs by mouth every 4 (four) hours as needed for cough., Disp: 118 mL, Rfl: 0   hydrocortisone 2.5 % cream, Apply topically 2 (two)  times daily as needed., Disp: , Rfl:    ibuprofen (ADVIL,MOTRIN) 800 MG tablet, Take 800 mg by mouth 2 (two) times daily as needed. For pain, Disp: , Rfl:    losartan (COZAAR) 100 MG tablet, Take by mouth., Disp: , Rfl:    methocarbamol (ROBAXIN) 500 MG tablet, Take 500 mg by mouth 3 (three) times daily as needed for muscle spasms., Disp: , Rfl:    NIFEdipine (PROCARDIA-XL/NIFEDICAL-XL) 30 MG 24 hr tablet, Take 30 mg by mouth daily., Disp: , Rfl:    ondansetron (ZOFRAN-ODT) 8 MG disintegrating tablet, Take 8 mg by mouth every 8 (eight) hours as needed for nausea or vomiting., Disp: , Rfl:    oxyCODONE-acetaminophen (PERCOCET/ROXICET) 5-325 MG tablet, Take by mouth every 4 (four) hours as needed for severe pain., Disp: , Rfl:    senna-docusate (SENOKOT-S) 8.6-50 MG per tablet, Take 2 tablets by mouth at bedtime as needed. For constipation, Disp: , Rfl:    sertraline (ZOLOFT) 50 MG tablet, Take 1 tablet by mouth daily., Disp: , Rfl:    torsemide (DEMADEX) 20 MG tablet, Take 20 mg by mouth 3 (three) times a week., Disp: , Rfl:    traMADol (ULTRAM) 50 MG tablet, Take by mouth every 6 (six) hours as needed., Disp: , Rfl:    TRELEGY ELLIPTA 100-62.5-25 MCG/ACT AEPB, Inhale 1 puff into the lungs daily., Disp: , Rfl:    triamcinolone cream (KENALOG) 0.1 %,  Apply 1 application topically 2 (two) times daily as needed., Disp: , Rfl:   Past Medical History: Past Medical History:  Diagnosis Date   Arthritis    osteoarthritis   Cancer (HCC) 2010   right lower lobe cancer    Complication of anesthesia 23 yrs ago .    woke up on operating table, also A-fib for short time after Lung surgery   Coronary artery disease 2010   woke up and states she was told  bottom of heart was asleep ... takes diltiazem for that reason .   GERD (gastroesophageal reflux disease)    Ovarian ca (HCC) 1972   Wears dentures    full upper and lower    Tobacco Use: Social History   Tobacco Use  Smoking Status Former    Current packs/day: 0.00   Average packs/day: 1 pack/day for 55.0 years (55.0 ttl pk-yrs)   Types: Cigarettes   Start date: 47   Quit date: 2009   Years since quitting: 15.6  Smokeless Tobacco Never    Labs: Review Flowsheet        No data to display           Pulmonary Assessment Scores:  Pulmonary Assessment Scores     Row Name 08/19/22 1611         ADL UCSD   ADL Phase Entry     SOB Score total 74     Rest 0     Walk 2     Stairs 5     Bath 3     Dress 3     Shop 5       CAT Score   CAT Score 13       mMRC Score   mMRC Score 3              UCSD: Self-administered rating of dyspnea associated with activities of daily living (ADLs) 6-point scale (0 = "not at all" to 5 = "maximal or unable to do because of breathlessness")  Scoring Scores range from 0 to 120.  Minimally important difference is 5 units  CAT: CAT can identify the health impairment of COPD patients and is better correlated with disease progression.  CAT has a scoring range of zero to 40. The CAT score is classified into four groups of low (less than 10), medium (10 - 20), high (21-30) and very high (31-40) based on the impact level of disease on health status. A CAT score over 10 suggests significant symptoms.  A worsening CAT score could be explained by an exacerbation, poor medication adherence, poor inhaler technique, or progression of COPD or comorbid conditions.  CAT MCID is 2 points  mMRC: mMRC (Modified Medical Research Council) Dyspnea Scale is used to assess the degree of baseline functional disability in patients of respiratory disease due to dyspnea. No minimal important difference is established. A decrease in score of 1 point or greater is considered a positive change.   Pulmonary Function Assessment:  Pulmonary Function Assessment - 08/19/22 1618       Pulmonary Function Tests   FEV1% 53 %    FEV1/FVC Ratio 58.9   Taken from PFT 07/20/22 ([facility])     Post Bronchodilator  Spirometry Results   FEV1% 52 %    FEV1/FVC Ratio 55.97             Exercise Target Goals: Exercise Program Goal: Individual exercise prescription set using results from initial 6 min walk test and THRR while  considering  patient's activity barriers and safety.   Exercise Prescription Goal: Initial exercise prescription builds to 30-45 minutes a day of aerobic activity, 2-3 days per week.  Home exercise guidelines will be given to patient during program as part of exercise prescription that the participant will acknowledge.  Education: Aerobic Exercise: - Group verbal and visual presentation on the components of exercise prescription. Introduces F.I.T.T principle from ACSM for exercise prescriptions.  Reviews F.I.T.T. principles of aerobic exercise including progression. Written material given at graduation.   Education: Resistance Exercise: - Group verbal and visual presentation on the components of exercise prescription. Introduces F.I.T.T principle from ACSM for exercise prescriptions  Reviews F.I.T.T. principles of resistance exercise including progression. Written material given at graduation.    Education: Exercise & Equipment Safety: - Individual verbal instruction and demonstration of equipment use and safety with use of the equipment. Flowsheet Row Pulmonary Rehab from 09/15/2022 in [facility] Cardiac and Pulmonary Rehab  Education need identified 08/19/22  Date 08/19/22  Educator KW  Instruction Review Code 1- Verbalizes Understanding       Education: Exercise Physiology & General Exercise Guidelines: - Group verbal and written instruction with models to review the exercise physiology of the cardiovascular system and associated critical values. Provides general exercise guidelines with specific guidelines to those with heart or lung disease.  Flowsheet Row Pulmonary Rehab from 09/15/2022 in [facility] Cardiac and Pulmonary Rehab  Date 09/15/22  Educator [facility]  Instruction Review  Code 1- Verbalizes Understanding       Education: Flexibility, Balance, Mind/Body Relaxation: - Group verbal and visual presentation with interactive activity on the components of exercise prescription. Introduces F.I.T.T principle from ACSM for exercise prescriptions. Reviews F.I.T.T. principles of flexibility and balance exercise training including progression. Also discusses the mind body connection.  Reviews various relaxation techniques to help reduce and manage stress (i.e. Deep breathing, progressive muscle relaxation, and visualization). Balance handout provided to take home. Written material given at graduation.   Activity Barriers & Risk Stratification:  Activity Barriers & Cardiac Risk Stratification - 08/19/22 1624       Activity Barriers & Cardiac Risk Stratification   Activity Barriers Arthritis;Balance Concerns;Deconditioning;Muscular Weakness;Assistive Device;Shortness of Breath             6 Minute Walk:  6 Minute Walk     Row Name 08/19/22 1624         6 Minute Walk   Phase Initial     Distance 570 feet     Walk Time 6 minutes     # of Rest Breaks 0     MPH 1.07     METS 1.48     RPE 15     Perceived Dyspnea  3     VO2 Peak 5.21     Symptoms Yes (comment)     Comments SOB, general fatigue     Resting HR 72 bpm     Resting BP 122/68     Resting Oxygen Saturation  96 %     Exercise Oxygen Saturation  during 6 min walk 94 %     Max Ex. HR 89 bpm     Max Ex. BP 142/68     2 Minute Post BP 124/68       Interval HR   1 Minute HR 85     2 Minute HR 88     3 Minute HR 87     4 Minute HR 89     5 Minute HR 88  6 Minute HR 87     2 Minute Post HR 70     Interval Heart Rate? Yes       Interval Oxygen   Interval Oxygen? Yes     Baseline Oxygen Saturation % 96 %     1 Minute Oxygen Saturation % 94 %     1 Minute Liters of Oxygen 0 L  RA     2 Minute Oxygen Saturation % 94 %     2 Minute Liters of Oxygen 0 L     3 Minute Oxygen Saturation % 94  %     3 Minute Liters of Oxygen 0 L     4 Minute Oxygen Saturation % 97 %     4 Minute Liters of Oxygen 0 L     5 Minute Oxygen Saturation % 97 %     5 Minute Liters of Oxygen 0 L     6 Minute Oxygen Saturation % 97 %     6 Minute Liters of Oxygen 0 L     2 Minute Post Oxygen Saturation % 98 %     2 Minute Post Liters of Oxygen 0 L             Oxygen Initial Assessment:  Oxygen Initial Assessment - 08/19/22 1611       Home Oxygen   Home Oxygen Device None    Sleep Oxygen Prescription None    Home Exercise Oxygen Prescription None    Home Resting Oxygen Prescription None      Initial 6 min Walk   Oxygen Used None      Program Oxygen Prescription   Program Oxygen Prescription None      Intervention   Short Term Goals To learn and demonstrate proper pursed lip breathing techniques or other breathing techniques. ;To learn and demonstrate proper use of respiratory medications    Long  Term Goals Compliance with respiratory medication;Demonstrates proper use of MDI's;Exhibits proper breathing techniques, such as pursed lip breathing or other method taught during program session             Oxygen Re-Evaluation:  Oxygen Re-Evaluation     Row Name 08/25/22 1538 09/15/22 1609 11/08/22 1542         Program Oxygen Prescription   Program Oxygen Prescription -- None None       Home Oxygen   Home Oxygen Device -- None None     Sleep Oxygen Prescription -- None None     Home Exercise Oxygen Prescription -- None None     Home Resting Oxygen Prescription -- None None       Goals/Expected Outcomes   Short Term Goals -- To learn and demonstrate proper use of respiratory medications;To learn and understand importance of maintaining oxygen saturations>88% To learn and demonstrate proper pursed lip breathing techniques or other breathing techniques.      Long  Term Goals -- Maintenance of O2 saturations>88%;Demonstrates proper use of MDI's;Verbalizes importance of monitoring  SPO2 with pulse oximeter and return demonstration Exhibits proper breathing techniques, such as pursed lip breathing or other method taught during program session     Comments Reviewed PLB technique with pt.  Talked about how it works and it's importance in maintaining their exercise saturations. She does not have a pulse oximeter to check her/his oxygen saturation at home. Informed her where to get one and explained why it is important to have one. Reviewed that oxygen saturations should be 88 percent and  above. Diaphragmatic and PLB breathing explained and performed with patient. Patient has a better understanding of how to do these exercises to help with breathing performance and relaxation. Patient performed breathing techniques adequately and to practice further at home.     Goals/Expected Outcomes Short: Become more profiecient at using PLB.   Long: Become independent at using PLB. Short: monitor oxygen at home with exertion. Long: maintain oxygen saturations above 88 percent independently. Short: practice PLB and diaphragmatic breathing at home. Long: Use PLB and diaphragmatic breathing independently post [facility].              Oxygen Discharge (Final Oxygen Re-Evaluation):  Oxygen Re-Evaluation - 11/08/22 1542       Program Oxygen Prescription   Program Oxygen Prescription None      Home Oxygen   Home Oxygen Device None    Sleep Oxygen Prescription None    Home Exercise Oxygen Prescription None    Home Resting Oxygen Prescription None      Goals/Expected Outcomes   Short Term Goals To learn and demonstrate proper pursed lip breathing techniques or other breathing techniques.     Long  Term Goals Exhibits proper breathing techniques, such as pursed lip breathing or other method taught during program session    Comments Diaphragmatic and PLB breathing explained and performed with patient. Patient has a better understanding of how to do these exercises to help with breathing  performance and relaxation. Patient performed breathing techniques adequately and to practice further at home.    Goals/Expected Outcomes Short: practice PLB and diaphragmatic breathing at home. Long: Use PLB and diaphragmatic breathing independently post [facility].             Initial Exercise Prescription:  Initial Exercise Prescription - 08/19/22 1600       Date of Initial Exercise RX and Referring Provider   Date 08/19/22    Referring Provider Vida Rigger MD      Oxygen   Maintain Oxygen Saturation 88% or higher      NuStep   Level 1    SPM 80    Minutes 15    METs 1.4      Biostep-RELP   Level 1    SPM 50    Minutes 15    METs 1.4      Track   Laps 7    Minutes 15    METs 1.44      Prescription Details   Frequency (times per week) 2    Duration Progress to 30 minutes of continuous aerobic without signs/symptoms of physical distress      Intensity   THRR 40-80% of Max Heartrate 99 - 126    Ratings of Perceived Exertion 11-13    Perceived Dyspnea 0-4      Progression   Progression Continue to progress workloads to maintain intensity without signs/symptoms of physical distress.      Resistance Training   Training Prescription Yes    Weight 2 lb    Reps 10-15             Perform Capillary Blood Glucose checks as needed.  Exercise Prescription Changes:   Exercise Prescription Changes     Row Name 08/19/22 1600 08/26/22 0900 09/06/22 1300 09/21/22 0700 10/04/22 1400     Response to Exercise   Blood Pressure (Admit) 122/68 130/70 110/60 118/58 120/60   Blood Pressure (Exercise) 142/68 130/60 138/68 110/54 142/68   Blood Pressure (Exit) 124/68 110/62 118/58 110/60 114/60  Heart Rate (Admit) 72 bpm 67 bpm 73 bpm 70 bpm 70 bpm   Heart Rate (Exercise) 89 bpm 94 bpm 85 bpm 94 bpm 94 bpm   Heart Rate (Exit) 70 bpm 80 bpm 67 bpm 70 bpm 70 bpm   Oxygen Saturation (Admit) 96 % 97 % 98 % 96 % 93 %   Oxygen Saturation (Exercise) 94 % 95 % 88 % 96 %  92 %   Oxygen Saturation (Exit) 97 % 97 % 98 % 97 % 96 %   Rating of Perceived Exertion (Exercise) 15 15 13 13 14    Perceived Dyspnea (Exercise) 3 0 3 2 2    Symptoms SOB, fatigue none SOB SOB SOB   Comments walk test results 1st full day of exercise -- -- --   Duration -- Progress to 30 minutes of  aerobic without signs/symptoms of physical distress Progress to 30 minutes of  aerobic without signs/symptoms of physical distress Progress to 30 minutes of  aerobic without signs/symptoms of physical distress Progress to 30 minutes of  aerobic without signs/symptoms of physical distress   Intensity -- THRR unchanged THRR unchanged THRR unchanged THRR unchanged     Progression   Progression -- Continue to progress workloads to maintain intensity without signs/symptoms of physical distress. Continue to progress workloads to maintain intensity without signs/symptoms of physical distress. Continue to progress workloads to maintain intensity without signs/symptoms of physical distress. Continue to progress workloads to maintain intensity without signs/symptoms of physical distress.   Average METs -- 2.07 2.09 2.31 2.12     Resistance Training   Training Prescription -- Yes Yes Yes Yes   Weight -- 2 lb 2 lb 2 lb 2 lb   Reps -- 10-15 10-15 10-15 10-15     Interval Training   Interval Training -- No No No No     Recumbant Bike   Level -- -- 1 -- --   Watts -- -- 11 -- --   Minutes -- -- 15 -- --     NuStep   Level -- -- 1 1 1    Minutes -- -- 15 15 15    METs -- -- -- -- 1.6     Biostep-RELP   Level -- 1 -- 1 2   Minutes -- 15 -- 15 15   METs -- 2 -- 2 2     Track   Laps -- 21 24 30 30    Minutes -- 15 15 15 15    METs -- 2.14 2.31 2.63 2.63     Oxygen   Maintain Oxygen Saturation -- 88% or higher 88% or higher 88% or higher 88% or higher    Row Name 10/22/22 0800 11/04/22 0700 11/18/22 0700         Response to Exercise   Blood Pressure (Admit) 118/62 110/60 98/52     Blood Pressure  (Exit) 114/58 100/60 120/60     Heart Rate (Admit) 71 bpm 80 bpm 78 bpm     Heart Rate (Exercise) 96 bpm 97 bpm 103 bpm     Heart Rate (Exit) 91 bpm 80 bpm 89 bpm     Oxygen Saturation (Admit) 96 % 95 % 93 %     Oxygen Saturation (Exercise) 92 % 91 % 91 %     Oxygen Saturation (Exit) 96 % 95 % 95 %     Rating of Perceived Exertion (Exercise) 15 13 13      Perceived Dyspnea (Exercise) 3 2 3      Symptoms  SOB SOB SOB     Duration Progress to 30 minutes of  aerobic without signs/symptoms of physical distress Progress to 30 minutes of  aerobic without signs/symptoms of physical distress Progress to 30 minutes of  aerobic without signs/symptoms of physical distress     Intensity THRR unchanged THRR unchanged THRR unchanged       Progression   Progression Continue to progress workloads to maintain intensity without signs/symptoms of physical distress. Continue to progress workloads to maintain intensity without signs/symptoms of physical distress. Continue to progress workloads to maintain intensity without signs/symptoms of physical distress.     Average METs 2.17 2.33 1.89       Resistance Training   Training Prescription Yes Yes Yes     Weight 2 lb 3 lb 3 lb     Reps 10-15 10-15 10-15       Interval Training   Interval Training No No No       Recumbant Bike   Level -- 1 --     Watts -- 15 --     Minutes -- 15 --       NuStep   Level 2 -- 2     Minutes 15 -- 15     METs 2 -- 1.4       Arm Ergometer   Level 1 1 1      Minutes 15 15 15      METs 1.6 2 1        T5 Nustep   Level -- -- 1     Minutes -- -- 15     METs -- -- 1.8       Biostep-RELP   Level 2 2 3      Minutes 15 15 15      METs 2 2 2        Track   Laps 30 30 25      Minutes 15 15 15      METs 2.63 2.63 2.36       Oxygen   Maintain Oxygen Saturation 88% or higher 88% or higher 88% or higher              Exercise Comments:   Exercise Comments     Row Name 08/25/22 1536           Exercise Comments  First full day of exercise!  Patient was oriented to gym and equipment including functions, settings, policies, and procedures.  Patient's individual exercise prescription and treatment plan were reviewed.  All starting workloads were established based on the results of the 6 minute walk test done at initial orientation visit.  The plan for exercise progression was also introduced and progression will be customized based on patient's performance and goals.                Exercise Goals and Review:   Exercise Goals     Row Name 08/19/22 1636             Exercise Goals   Increase Physical Activity Yes       Intervention Provide advice, education, support and counseling about physical activity/exercise needs.;Develop an individualized exercise prescription for aerobic and resistive training based on initial evaluation findings, risk stratification, comorbidities and participant's personal goals.       Expected Outcomes Short Term: Attend rehab on a regular basis to increase amount of physical activity.;Long Term: Add in home exercise to make exercise part of routine and to increase amount of physical activity.;Long Term: Exercising regularly at  least 3-5 days a week.       Increase Strength and Stamina Yes       Intervention Provide advice, education, support and counseling about physical activity/exercise needs.;Develop an individualized exercise prescription for aerobic and resistive training based on initial evaluation findings, risk stratification, comorbidities and participant's personal goals.       Expected Outcomes Short Term: Perform resistance training exercises routinely during rehab and add in resistance training at home;Short Term: Increase workloads from initial exercise prescription for resistance, speed, and METs.;Long Term: Improve cardiorespiratory fitness, muscular endurance and strength as measured by increased METs and functional capacity ( )       Able to understand and  use rate of perceived exertion (RPE) scale Yes       Intervention Provide education and explanation on how to use RPE scale       Expected Outcomes Short Term: Able to use RPE daily in rehab to express subjective intensity level;Long Term:  Able to use RPE to guide intensity level when exercising independently       Able to understand and use Dyspnea scale Yes       Intervention Provide education and explanation on how to use Dyspnea scale       Expected Outcomes Short Term: Able to use Dyspnea scale daily in rehab to express subjective sense of shortness of breath during exertion;Long Term: Able to use Dyspnea scale to guide intensity level when exercising independently       Knowledge and understanding of Target Heart Rate Range (THRR) Yes       Intervention Provide education and explanation of THRR including how the numbers were predicted and where they are located for reference       Expected Outcomes Short Term: Able to state/look up THRR;Long Term: Able to use THRR to govern intensity when exercising independently;Short Term: Able to use daily as guideline for intensity in rehab       Able to check pulse independently Yes       Intervention Provide education and demonstration on how to check pulse in carotid and radial arteries.;Review the importance of being able to check your own pulse for safety during independent exercise       Expected Outcomes Long Term: Able to check pulse independently and accurately;Short Term: Able to explain why pulse checking is important during independent exercise       Understanding of Exercise Prescription Yes       Intervention Provide education, explanation, and written materials on patient's individual exercise prescription       Expected Outcomes Short Term: Able to explain program exercise prescription;Long Term: Able to explain home exercise prescription to exercise independently                Exercise Goals Re-Evaluation :  Exercise Goals  Re-Evaluation     Row Name 08/25/22 1538 08/26/22 0932 09/06/22 1401 09/15/22 1622 09/21/22 0719     Exercise Goal Re-Evaluation   Exercise Goals Review Increase Physical Activity;Able to understand and use rate of perceived exertion (RPE) scale;Knowledge and understanding of Target Heart Rate Range (THRR);Understanding of Exercise Prescription;Increase Strength and Stamina;Able to understand and use Dyspnea scale;Able to check pulse independently Increase Physical Activity;Increase Strength and Stamina;Understanding of Exercise Prescription Increase Physical Activity;Increase Strength and Stamina;Understanding of Exercise Prescription Increase Physical Activity;Increase Strength and Stamina;Understanding of Exercise Prescription Increase Physical Activity;Increase Strength and Stamina;Understanding of Exercise Prescription   Comments Reviewed RPE scale, THR and program prescription with pt today.  Pt voiced understanding and was given a copy of goals to take home. Ann did well for her first session here at rehab. She was able to complete 21 laps on the track! Her RPEs are appropriate and O2 saturations stayed above 88%. We will continue to monitor as she progresses in the program. Dewayne Hatch is doing well in rehab. She recently increased her number of laps on the track up to 24 laps. She also has continued to work at level 1 on the T4 nustep and recumbent bike. She has done well with 2 lb hand weights for resistance training as well. We will continue to monitor her progress in the program. Dewayne Hatch is up to 27 laps on the track. She would like to do the XR next time she is in class. She states that the biostep is very hard for her. Dewayne Hatch is doing well in rehab.  She is up to 30 laps.  Her saturations are still doing well.  We will encourage her to try to increased seated equipment to level 2.  We will continue to monitor her progress.   Expected Outcomes Short: Use RPE daily to regulate intensity.  Long: Follow program  prescription in [facility]. Short: Continue initial exercise prescription Long: Build up overall strength and stamina Short: Begin to progressively increase workloads. Long: Continue to improve strength and stamina. Short: work on the XR. Long: maintain exercise post [facility]. Short: Increase seated workloads Long: Conitnue to improve stamina.    Row Name 10/04/22 1421 10/22/22 0818 11/04/22 0742 11/18/22 0753       Exercise Goal Re-Evaluation   Exercise Goals Review Increase Physical Activity;Increase Strength and Stamina;Understanding of Exercise Prescription Increase Physical Activity;Increase Strength and Stamina;Understanding of Exercise Prescription Increase Physical Activity;Increase Strength and Stamina;Understanding of Exercise Prescription Increase Physical Activity;Increase Strength and Stamina;Understanding of Exercise Prescription    Comments Dewayne Hatch is doing well in rehab. She continues to walk up to 30 laps on the track. She also continues to work at level 1 on the T4 nustep and improved to level 2 on the biostep. She continues to do well with 2 lb hand weights for resistance training as well. We will continue to monitor her progress in the program. Dewayne Hatch is doing well in rehab. She continues to work at level 1 on the arm crank and level 2 on the biostep. She did improve to level 2 on the T4 nustep. She also continues to walk up to 30 laps on the track. We will continue to monitor her progress in the program. Dewayne Hatch continues to do well in rehab. She continues to walk 30 laps on the track and work at level 2 on the biostep. She has also stayed consistent at level 1 on the recumbent bike and arm crank. She did increase from 2 lb to 3 lb hand weights for resistance training as well. We will continue to monitor her progress in the program. Dewayne Hatch is doing well in rehab. She improved to level 3 on the biostep and kept her other seated workloads consistent. She did have a decline in her laps on the track, going from  previously walking 30 laps down to 25 laps. We will continue to monitor her progress in the program.    Expected Outcomes Short: Increase T4 nustep to level 2. Long: Continue to improve strength and stamina. Short: Continue to walk 30 laps consistently. Long: Continue to improve strength and stamina. Short: Try level 2 on the recumbent bike. Long: Continue exercise to improve strength  and stamina. Short: Increase laps on track back up to previous amount. Long: Continue exercise to improve strength and stamina.             Discharge Exercise Prescription (Final Exercise Prescription Changes):  Exercise Prescription Changes - 11/18/22 0700       Response to Exercise   Blood Pressure (Admit) 98/52    Blood Pressure (Exit) 120/60    Heart Rate (Admit) 78 bpm    Heart Rate (Exercise) 103 bpm    Heart Rate (Exit) 89 bpm    Oxygen Saturation (Admit) 93 %    Oxygen Saturation (Exercise) 91 %    Oxygen Saturation (Exit) 95 %    Rating of Perceived Exertion (Exercise) 13    Perceived Dyspnea (Exercise) 3    Symptoms SOB    Duration Progress to 30 minutes of  aerobic without signs/symptoms of physical distress    Intensity THRR unchanged      Progression   Progression Continue to progress workloads to maintain intensity without signs/symptoms of physical distress.    Average METs 1.89      Resistance Training   Training Prescription Yes    Weight 3 lb    Reps 10-15      Interval Training   Interval Training No      NuStep   Level 2    Minutes 15    METs 1.4      Arm Ergometer   Level 1    Minutes 15    METs 1      T5 Nustep   Level 1    Minutes 15    METs 1.8      Biostep-RELP   Level 3    Minutes 15    METs 2      Track   Laps 25    Minutes 15    METs 2.36      Oxygen   Maintain Oxygen Saturation 88% or higher             Nutrition:  Target Goals: Understanding of nutrition guidelines, daily intake of sodium 1500mg , cholesterol 200mg , calories 30%  from fat and 7% or less from saturated fats, daily to have 5 or more servings of fruits and vegetables.  Education: All About Nutrition: -Group instruction provided by verbal, written material, interactive activities, discussions, models, and posters to present general guidelines for heart healthy nutrition including fat, fiber, MyPlate, the role of sodium in heart healthy nutrition, utilization of the nutrition label, and utilization of this knowledge for meal planning. Follow up email sent as well. Written material given at graduation. Flowsheet Row Pulmonary Rehab from 09/15/2022 in [facility] Cardiac and Pulmonary Rehab  Education need identified 08/19/22       Biometrics:  Pre Biometrics - 08/19/22 1624       Pre Biometrics   Height 5\' 8"  (1.727 m)    Weight 134 lb (60.8 kg)    Waist Circumference 31 inches    Hip Circumference 38 inches    Waist to Hip Ratio 0.82 %    BMI (Calculated) 20.38    Single Leg Stand 2.5 seconds              Nutrition Therapy Plan and Nutrition Goals:  Nutrition Therapy & Goals - 08/19/22 1616       Intervention Plan   Intervention Prescribe, educate and counsel regarding individualized specific dietary modifications aiming towards targeted core components such as weight, hypertension, lipid management, diabetes, heart failure  and other comorbidities.    Expected Outcomes Short Term Goal: Understand basic principles of dietary content, such as calories, fat, sodium, cholesterol and nutrients.;Short Term Goal: A plan has been developed with personal nutrition goals set during dietitian appointment.;Long Term Goal: Adherence to prescribed nutrition plan.             Nutrition Assessments:  MEDIFICTS Score Key: ?70 Need to make dietary changes  40-70 Heart Healthy Diet ? 40 Therapeutic Level Cholesterol Diet  Flowsheet Row Pulmonary Rehab from 08/19/2022 in [facility] Cardiac and Pulmonary Rehab  Picture Your Plate Total Score on Admission 44       Picture Your Plate Scores: <40 Unhealthy dietary pattern with much room for improvement. 41-50 Dietary pattern unlikely to meet recommendations for good health and room for improvement. 51-60 More healthful dietary pattern, with some room for improvement.  >60 Healthy dietary pattern, although there may be some specific behaviors that could be improved.   Nutrition Goals Re-Evaluation:  Nutrition Goals Re-Evaluation     Row Name 09/15/22 1612 11/08/22 1544           Goals   Current Weight 137 lb (62.1 kg) --      Nutrition Goal Eat smaller portions --      Comment Patient was informed on why it is important to maintain a balanced diet when dealing with Respiratory issues. Explained that it takes a lot of energy to breath and when they are short of breath often they will need to have a good diet to help keep up with the calories they are expending for breathing. Patient defers RD appointment      Expected Outcome Short: Choose and plan snacks accordingly to patients caloric intake to improve breathing. Long: Maintain a diet independently that meets their caloric intake to aid in daily shortness of breath. --               Nutrition Goals Discharge (Final Nutrition Goals Re-Evaluation):  Nutrition Goals Re-Evaluation - 11/08/22 1544       Goals   Comment Patient defers RD appointment             Psychosocial: Target Goals: Acknowledge presence or absence of significant depression and/or stress, maximize coping skills, provide positive support system. Participant is able to verbalize types and ability to use techniques and skills needed for reducing stress and depression.   Education: Stress, Anxiety, and Depression - Group verbal and visual presentation to define topics covered.  Reviews how body is impacted by stress, anxiety, and depression.  Also discusses healthy ways to reduce stress and to treat/manage anxiety and depression.  Written material given at  graduation. Flowsheet Row Pulmonary Rehab from 09/15/2022 in [facility] Cardiac and Pulmonary Rehab  Date 09/08/22  Educator KW  Instruction Review Code 1- [employer] Understanding       Education: Sleep Hygiene -Provides group verbal and written instruction about how sleep can affect your health.  Define sleep hygiene, discuss sleep cycles and impact of sleep habits. Review good sleep hygiene tips.    Initial Review & Psychosocial Screening:  Initial Psych Review & Screening - 08/12/22 1510       Initial Review   Current issues with Current Anxiety/Panic;Current Psychotropic Meds   zoloft  as needed for anxiety     Family Dynamics   Good Support System? Yes   Marianne Sofia in law and grand daughter  great grandson 67     Barriers   Psychosocial barriers  to participate in program There are no identifiable barriers or psychosocial needs.      Screening Interventions   Interventions Encouraged to exercise;To provide support and resources with identified psychosocial needs;Provide feedback about the scores to participant    Expected Outcomes Short Term goal: Utilizing psychosocial counselor, staff and physician to assist with identification of specific Stressors or current issues interfering with healing process. Setting desired goal for each stressor or current issue identified.;Long Term Goal: Stressors or current issues are controlled or eliminated.;Short Term goal: Identification and review with participant of any Quality of Life or Depression concerns found by scoring the questionnaire.;Long Term goal: The participant improves quality of Life and PHQ9 Scores as seen by post scores and/or verbalization of changes             Quality of Life Scores:  Scores of 19 and below usually indicate a poorer quality of life in these areas.  A difference of  2-3 points is a clinically meaningful difference.  A difference of 2-3 points in the total score of the Quality of Life Index has been  associated with significant improvement in overall quality of life, self-image, physical symptoms, and general health in studies assessing change in quality of life.  PHQ-9: Review Flowsheet       09/20/2022 09/15/2022 08/19/2022  Depression screen PHQ 2/9  Decreased Interest 1 0 2  Down, Depressed, Hopeless 1 1 1   PHQ - 2 Score 2 1 3   Altered sleeping 0 0 0  Tired, decreased energy 0 0 2  Change in appetite 0 0 3  Feeling bad or failure about yourself  0 0 3  Trouble concentrating 0 0 0  Moving slowly or fidgety/restless 0 0 0  Suicidal thoughts 0 0 1  PHQ-9 Score 2 1 12   Difficult doing work/chores Not difficult at all Not difficult at all Somewhat difficult    Details           Interpretation of Total Score  Total Score Depression Severity:  1-4 = Minimal depression, 5-9 = Mild depression, 10-14 = Moderate depression, 15-19 = Moderately severe depression, 20-27 = Severe depression   Psychosocial Evaluation and Intervention:  Psychosocial Evaluation - 08/12/22 1634       Psychosocial Evaluation & Interventions   Interventions Encouraged to exercise with the program and follow exercise prescription    Comments Dewayne Hatch is ready to start the program.  She does not have transportation. She was advised  about and given the [facility] information and phone number.  She lives alone and has support from friends. She would like to see improvement with her shortness of breath. She will plan on attending 2 days aweek.    Expected Outcomes STG Dewayne Hatch will be able to attend all scheduled sessions and work on her exercise progression and improve her breathing with ADLs.  LTG Dewayne Hatch will be able to continue to wrok on her exercise progression and improved breathing    Continue Psychosocial Services  Follow up required by staff             Psychosocial Re-Evaluation:  Psychosocial Re-Evaluation     Row Name 09/15/22 1619 11/08/22 1543           Psychosocial Re-Evaluation   Current issues with  Current Stress Concerns None Identified      Comments Reviewed patient health questionnaire (PHQ-9) with patient for follow up. Previously, patients score indicated signs/symptoms of depression.  Reviewed to see if patient is improving symptom wise  while in program.  Score improved and patient states that it is because she is able to exersise more. Patient reports no issues with their current mental states, sleep, stress, depression or anxiety. Will follow up with patient in a few weeks for any changes.      Expected Outcomes Short: Continue to attend [facility] regularly for regular exercise and social engagement. Long: Continue to improve symptoms and manage a positive mental state. Short: Continue to exercise regularly to support mental health and notify staff of any changes. Long: maintain mental health and well being through teaching of rehab or prescribed medications independently.      Interventions Encouraged to attend Pulmonary Rehabilitation for the exercise Encouraged to attend Pulmonary Rehabilitation for the exercise      Continue Psychosocial Services  Follow up required by staff Follow up required by staff               Psychosocial Discharge (Final Psychosocial Re-Evaluation):  Psychosocial Re-Evaluation - 11/08/22 1543       Psychosocial Re-Evaluation   Current issues with None Identified    Comments Patient reports no issues with their current mental states, sleep, stress, depression or anxiety. Will follow up with patient in a few weeks for any changes.    Expected Outcomes Short: Continue to exercise regularly to support mental health and notify staff of any changes. Long: maintain mental health and well being through teaching of rehab or prescribed medications independently.    Interventions Encouraged to attend Pulmonary Rehabilitation for the exercise    Continue Psychosocial Services  Follow up required by staff             Education: Education Goals: Education  classes will be provided on a weekly basis, covering required topics. Participant will state understanding/return demonstration of topics presented.  Learning Barriers/Preferences:   General Pulmonary Education Topics:  Infection Prevention: - Provides verbal and written material to individual with discussion of infection control including proper hand washing and proper equipment cleaning during exercise session. Flowsheet Row Pulmonary Rehab from 09/15/2022 in [facility] Cardiac and Pulmonary Rehab  Education need identified 08/19/22  Date 08/19/22  Educator KW  Instruction Review Code 1- Verbalizes Understanding       Falls Prevention: - Provides verbal and written material to individual with discussion of falls prevention and safety. Flowsheet Row Pulmonary Rehab from 08/12/2022 in [facility] Cardiac and Pulmonary Rehab  Date 08/12/22  Educator SB  Instruction Review Code 1- Verbalizes Understanding       Chronic Lung Disease Review: - Group verbal instruction with posters, models, PowerPoint presentations and videos,  to review new updates, new respiratory medications, new advancements in procedures and treatments. Providing information on websites and "800" numbers for continued self-education. Includes information about supplement oxygen, available portable oxygen systems, continuous and intermittent flow rates, oxygen safety, concentrators, and Medicare reimbursement for oxygen. Explanation of Pulmonary Drugs, including class, frequency, complications, importance of spacers, rinsing mouth after steroid MDI's, and proper cleaning methods for nebulizers. Review of basic lung anatomy and physiology related to function, structure, and complications of lung disease. Review of risk factors. Discussion about methods for diagnosing sleep apnea and types of masks and machines for OSA. Includes a review of the use of types of environmental controls: home humidity, furnaces, filters, dust mite/pet  prevention, HEPA vacuums. Discussion about weather changes, air quality and the benefits of nasal washing. Instruction on Warning signs, infection symptoms, calling MD promptly, preventive modes, and value of vaccinations. Review of effective  airway clearance, coughing and/or vibration techniques. Emphasizing that all should Create an Action Plan. Written material given at graduation. Flowsheet Row Pulmonary Rehab from 09/15/2022 in [facility] Cardiac and Pulmonary Rehab  Education need identified 08/19/22  Date 09/01/22  Educator [facility]  Instruction Review Code 1- Verbalizes Understanding       AED/CPR: - Group verbal and written instruction with the use of models to demonstrate the basic use of the AED with the basic ABC's of resuscitation.    Anatomy and Cardiac Procedures: - Group verbal and visual presentation and models provide information about basic cardiac anatomy and function. Reviews the testing methods done to diagnose heart disease and the outcomes of the test results. Describes the treatment choices: Medical Management, Angioplasty, or Coronary Bypass Surgery for treating various heart conditions including Myocardial Infarction, Angina, Valve Disease, and Cardiac Arrhythmias.  Written material given at graduation.   Medication Safety: - Group verbal and visual instruction to review commonly prescribed medications for heart and lung disease. Reviews the medication, class of the drug, and side effects. Includes the steps to properly store meds and maintain the prescription regimen.  Written material given at graduation.   Other: -Provides group and verbal instruction on various topics (see comments)   Knowledge Questionnaire Score:  Knowledge Questionnaire Score - 08/19/22 1611       Knowledge Questionnaire Score   Pre Score 15/18              Core Components/Risk Factors/Patient Goals at Admission:  Personal Goals and Risk Factors at Admission - 08/19/22 1636       Core  Components/Risk Factors/Patient Goals on Admission    Weight Management Yes;Weight Gain   Per pt request, had lost some weight and would like to regain some back   Intervention Weight Management: Develop a combined nutrition and exercise program designed to reach desired caloric intake, while maintaining appropriate intake of nutrient and fiber, sodium and fats, and appropriate energy expenditure required for the weight goal.;Weight Management: Provide education and appropriate resources to help participant work on and attain dietary goals.    Admit Weight 134 lb (60.8 kg)    Goal Weight: Short Term 137 lb (62.1 kg)    Goal Weight: Long Term 140 lb (63.5 kg)    Expected Outcomes Short Term: Continue to assess and modify interventions until short term weight is achieved;Long Term: Adherence to nutrition and physical activity/exercise program aimed toward attainment of established weight goal;Weight Gain: Understanding of general recommendations for a high calorie, high protein meal plan that promotes weight gain by distributing calorie intake throughout the day with the consumption for 4-5 meals, snacks, and/or supplements;Understanding recommendations for meals to include 15-35% energy as protein, 25-35% energy from fat, 35-60% energy from carbohydrates, less than 200mg  of dietary cholesterol, 20-35 gm of total fiber daily;Understanding of distribution of calorie intake throughout the day with the consumption of 4-5 meals/snacks    Improve shortness of breath with ADL's Yes    Intervention Provide education, individualized exercise plan and daily activity instruction to help decrease symptoms of SOB with activities of daily living.    Expected Outcomes Short Term: Improve cardiorespiratory fitness to achieve a reduction of symptoms when performing ADLs;Long Term: Be able to perform more ADLs without symptoms or delay the onset of symptoms    Increase knowledge of respiratory medications and ability to use  respiratory devices properly  Yes    Intervention Provide education and demonstration as needed of appropriate use of medications, inhalers,  and oxygen therapy.    Expected Outcomes Short Term: Achieves understanding of medications use. Understands that oxygen is a medication prescribed by physician. Demonstrates appropriate use of inhaler and oxygen therapy.;Long Term: Maintain appropriate use of medications, inhalers, and oxygen therapy.    Hypertension Yes    Intervention Provide education on lifestyle modifcations including regular physical activity/exercise, weight management, moderate sodium restriction and increased consumption of fresh fruit, vegetables, and low fat dairy, alcohol moderation, and smoking cessation.;Monitor prescription use compliance.    Expected Outcomes Short Term: Continued assessment and intervention until BP is < 140/73mm HG in hypertensive participants. < 130/60mm HG in hypertensive participants with diabetes, heart failure or chronic kidney disease.;Long Term: Maintenance of blood pressure at goal levels.             Education:Diabetes - Individual verbal and written instruction to review signs/symptoms of diabetes, desired ranges of glucose level fasting, after meals and with exercise. Acknowledge that pre and post exercise glucose checks will be done for 3 sessions at entry of program.   Know Your Numbers and Heart Failure: - Group verbal and visual instruction to discuss disease risk factors for cardiac and pulmonary disease and treatment options.  Reviews associated critical values for Overweight/Obesity, Hypertension, Cholesterol, and Diabetes.  Discusses basics of heart failure: signs/symptoms and treatments.  Introduces Heart Failure Zone chart for action plan for heart failure.  Written material given at graduation. Flowsheet Row Pulmonary Rehab from 09/15/2022 in [facility] Cardiac [facility] Pulmonary Rehab  Date 08/25/22  Educator MS  Instruction Review Code 1-  Verbalizes Understanding       Core Components/Risk Factors/Patient Goals Review:   Goals and Risk Factor Review     Row Name 09/15/22 1611 11/08/22 1545           Core Components/Risk Factors/Patient Goals Review   Personal Goals Review Improve shortness of breath with ADL's Improve shortness of breath with ADL's      Review Spoke to patient about their shortness of breath and what they can do to improve. Patient has been informed of breathing techniques when starting the program. Patient is informed to tell staff if they have had any med changes and that certain meds they are taking or not taking can be causing shortness of breath. Patient has been doing well in the program and does not need to wear oxygen. Her saturation have been in the 90s and high 90s. She has some shortness of breath here and there but mostly when she is walking.      Expected Outcomes Short: Attend [facility] regularly to improve shortness of breath with ADL's. Long: maintain independence with ADL's Short:Continue to walk and improve SOB. Long: graduate [facility].               Core Components/Risk Factors/Patient Goals at Discharge (Final Review):   Goals and Risk Factor Review - 11/08/22 1545       Core Components/Risk Factors/Patient Goals Review   Personal Goals Review Improve shortness of breath with ADL's    Review Patient has been doing well in the program and does not need to wear oxygen. Her saturation have been in the 90s and high 90s. She has some shortness of breath here and there but mostly when she is walking.    Expected Outcomes Short:Continue to walk and improve SOB. Long: graduate [facility].             ITP Comments:  ITP Comments     Row Name 08/12/22 [phone]  08/19/22 1608 08/25/22 1536 09/01/22 0939 09/29/22 1131   ITP Comments Virtual orientation call completed today. shehas an appointment on Date: 5/22024  for EP eval and gym Orientation.  Documentation of diagnosis can be found in  C HLDate: 07/07/2022 . Completed and gym orientation. Initial ITP created and sent for review to Dr. Vida Rigger, Medical Director. First full day of exercise!  Patient was oriented to gym and equipment including functions, settings, policies, and procedures.  Patient's individual exercise prescription and treatment plan were reviewed.  All starting workloads were established based on the results of the 6 minute walk test done at initial orientation visit.  The plan for exercise progression was also introduced and progression will be customized based on patient's performance and goals. 30 Day review completed. Medical Director ITP review done, changes made as directed, and signed approval by Medical Director.    new to program 30 Day review completed. Medical Director ITP review done, changes made as directed, and signed approval by Medical Director.    Row Name 10/26/22 1504 11/24/22 1203         ITP Comments 30 Day review completed. Medical Director ITP review done, changes made as directed, and signed approval by Medical Director. 30 Day review completed. Medical Director ITP review done, changes made as directed, and signed approval by Medical Director.               Comments:

## 2022-11-29 ENCOUNTER — Encounter: Payer: Medicare PPO | Admitting: *Deleted

## 2022-11-29 DIAGNOSIS — Z5189 Encounter for other specified aftercare: Secondary | ICD-10-CM | POA: Diagnosis not present

## 2022-11-29 DIAGNOSIS — J449 Chronic obstructive pulmonary disease, unspecified: Secondary | ICD-10-CM

## 2022-11-29 NOTE — Progress Notes (Signed)
Daily Session Note  Patient Details  Name: Bridget Deleon MRN: [id number] Date of Birth: May 19, 1940 Referring Provider:   Flowsheet Row Pulmonary Rehab from 08/19/2022 in [facility] Cardiac and Pulmonary Rehab  Referring Provider Vida Rigger MD       Encounter Date: 11/29/2022  Check In:  Session Check In - 11/29/22 1619       Check-In   Supervising physician immediately available to respond to emergencies See telemetry face sheet for immediately available ER MD    Location [facility]-Cardiac & Pulmonary Rehab    Staff Present Elige Ko, Guinevere Ferrari, RN, ADN; Jewel Baize, RN BSN    Virtual Visit No    Medication changes reported     No    Fall or balance concerns reported    No    Warm-up and Cool-down Performed on first and last piece of equipment    Resistance Training Performed Yes    VAD Patient? No    PAD/SET Patient? No      Pain Assessment   Currently in Pain? No/denies                Social History   Tobacco Use  Smoking Status Former   Current packs/day: 0.00   Average packs/day: 1 pack/day for 55.0 years (55.0 ttl pk-yrs)   Types: Cigarettes   Start date: 44   Quit date: 2009   Years since quitting: 15.6  Smokeless Tobacco Never    Goals Met:  Independence with exercise equipment Exercise tolerated well No report of concerns or symptoms today Strength training completed today  Goals Unmet:  Not Applicable  Comments: Pt able to follow exercise prescription today without complaint.  Will continue to monitor for progression.    Dr. Bethann Punches is Medical Director for [facility] Cardiac Rehabilitation.  Dr. Vida Rigger is Medical Director for [facility] Pulmonary Rehabilitation.

## 2022-12-01 ENCOUNTER — Encounter: Payer: Medicare PPO | Admitting: *Deleted

## 2022-12-01 DIAGNOSIS — J449 Chronic obstructive pulmonary disease, unspecified: Secondary | ICD-10-CM

## 2022-12-01 DIAGNOSIS — Z5189 Encounter for other specified aftercare: Secondary | ICD-10-CM | POA: Diagnosis not present

## 2022-12-01 NOTE — Progress Notes (Signed)
Daily Session Note  Patient Details  Name: Bridget Deleon MRN: [id number] Date of Birth: Jun 01, 1940 Referring Provider:   Flowsheet Row Pulmonary Rehab from 08/19/2022 in [facility] Cardiac and Pulmonary Rehab  Referring Provider Vida Rigger MD       Encounter Date: 12/01/2022  Check In:  Session Check In - 12/01/22 1558       Check-In   Supervising physician immediately available to respond to emergencies See telemetry face sheet for immediately available ER MD    Location [facility]-Cardiac & Pulmonary Rehab    Staff Present Lanny Hurst, RN, ADN; Jewel Baize, RN BSN;Laureen Manson Passey, BS, RRT, CPFT    Virtual Visit No    Medication changes reported     No    Fall or balance concerns reported    No    Warm-up and Cool-down Performed on first and last piece of equipment    Resistance Training Performed Yes    VAD Patient? No    PAD/SET Patient? No      Pain Assessment   Currently in Pain? No/denies                Social History   Tobacco Use  Smoking Status Former   Current packs/day: 0.00   Average packs/day: 1 pack/day for 55.0 years (55.0 ttl pk-yrs)   Types: Cigarettes   Start date: 36   Quit date: 2009   Years since quitting: 15.6  Smokeless Tobacco Never    Goals Met:  Independence with exercise equipment Exercise tolerated well No report of concerns or symptoms today Strength training completed today  Goals Unmet:  Not Applicable  Comments: Pt able to follow exercise prescription today without complaint.  Will continue to monitor for progression.    Dr. Bethann Punches is Medical Director for [facility] Cardiac Rehabilitation.  Dr. Vida Rigger is Medical Director for [facility] Pulmonary Rehabilitation.

## 2022-12-06 ENCOUNTER — Encounter: Payer: Medicare PPO | Admitting: *Deleted

## 2022-12-06 DIAGNOSIS — Z5189 Encounter for other specified aftercare: Secondary | ICD-10-CM | POA: Diagnosis not present

## 2022-12-06 DIAGNOSIS — J449 Chronic obstructive pulmonary disease, unspecified: Secondary | ICD-10-CM

## 2022-12-06 NOTE — Progress Notes (Signed)
Daily Session Note  Patient Details  Name: Bridget Deleon MRN: [id number] Date of Birth: October 12, 1940 Referring Provider:   Flowsheet Row Pulmonary Rehab from 08/19/2022 in [facility] Cardiac and Pulmonary Rehab  Referring Provider Vida Rigger MD       Encounter Date: 12/06/2022  Check In:  Session Check In - 12/06/22 1528       Check-In   Supervising physician immediately available to respond to emergencies See telemetry face sheet for immediately available ER MD    Location [facility]-Cardiac & Pulmonary Rehab    Staff Present Hulen Luster, BS, RRT, CPFT;Gissella Niblack Jewel Baize, RN BSN;Maxon Conetta BS, , Exercise Physiologist    Virtual Visit No    Medication changes reported     No    Fall or balance concerns reported    No    Warm-up and Cool-down Performed on first and last piece of equipment    Resistance Training Performed Yes    VAD Patient? No    PAD/SET Patient? No      Pain Assessment   Currently in Pain? No/denies                Social History   Tobacco Use  Smoking Status Former   Current packs/day: 0.00   Average packs/day: 1 pack/day for 55.0 years (55.0 ttl pk-yrs)   Types: Cigarettes   Start date: 28   Quit date: 2009   Years since quitting: 15.6  Smokeless Tobacco Never    Goals Met:  Independence with exercise equipment Exercise tolerated well No report of concerns or symptoms today Strength training completed today  Goals Unmet:  Not Applicable  Comments: Pt able to follow exercise prescription today without complaint.  Will continue to monitor for progression.    Dr. Bethann Punches is Medical Director for [facility] Cardiac Rehabilitation.  Dr. Vida Rigger is Medical Director for [facility] Pulmonary Rehabilitation.

## 2022-12-08 ENCOUNTER — Encounter: Payer: Medicare PPO | Admitting: *Deleted

## 2022-12-08 DIAGNOSIS — J449 Chronic obstructive pulmonary disease, unspecified: Secondary | ICD-10-CM

## 2022-12-08 DIAGNOSIS — Z5189 Encounter for other specified aftercare: Secondary | ICD-10-CM | POA: Diagnosis not present

## 2022-12-08 NOTE — Progress Notes (Signed)
Daily Session Note  Patient Details  Name: Bridget Deleon MRN: [id number] Date of Birth: Sep 23, 1940 Referring Provider:   Flowsheet Row Pulmonary Rehab from 08/19/2022 in [facility] Cardiac and Pulmonary Rehab  Referring Provider Vida Rigger MD       Encounter Date: 12/08/2022  Check In:  Session Check In - 12/08/22 1525       Check-In   Supervising physician immediately available to respond to emergencies See telemetry face sheet for immediately available ER MD    Location [facility]-Cardiac & Pulmonary Rehab    Staff Present Lanny Hurst, RN, ADN;Keng Jewel Jewel Baize, RN BSN;Susanne Bice, RN, BSN, CCRP    Virtual Visit No    Medication changes reported     No    Fall or balance concerns reported    No    Warm-up and Cool-down Performed on first and last piece of equipment    Resistance Training Performed Yes    VAD Patient? No    PAD/SET Patient? No      Pain Assessment   Currently in Pain? No/denies                Social History   Tobacco Use  Smoking Status Former   Current packs/day: 0.00   Average packs/day: 1 pack/day for 55.0 years (55.0 ttl pk-yrs)   Types: Cigarettes   Start date: 28   Quit date: 2009   Years since quitting: 15.6  Smokeless Tobacco Never    Goals Met:  Independence with exercise equipment Exercise tolerated well No report of concerns or symptoms today Strength training completed today  Goals Unmet:  Not Applicable  Comments: Pt able to follow exercise prescription today without complaint.  Will continue to monitor for progression.    Dr. Bethann Punches is Medical Director for [facility] Cardiac Rehabilitation.  Dr. Vida Rigger is Medical Director for [facility] Pulmonary Rehabilitation.

## 2022-12-13 ENCOUNTER — Encounter: Payer: Medicare PPO | Admitting: *Deleted

## 2022-12-13 DIAGNOSIS — Z5189 Encounter for other specified aftercare: Secondary | ICD-10-CM | POA: Diagnosis not present

## 2022-12-13 DIAGNOSIS — J449 Chronic obstructive pulmonary disease, unspecified: Secondary | ICD-10-CM

## 2022-12-13 NOTE — Progress Notes (Signed)
Daily Session Note  Patient Details  Name: Bridget Deleon MRN: [id number] Date of Birth: 02-18-1941 Referring Provider:   Flowsheet Row Pulmonary Rehab from 08/19/2022 in [facility] Cardiac and Pulmonary Rehab  Referring Provider Vida Rigger MD       Encounter Date: 12/13/2022  Check In:  Session Check In - 12/13/22 1546       Check-In   Supervising physician immediately available to respond to emergencies See telemetry face sheet for immediately available ER MD    Location [facility]-Cardiac & Pulmonary Rehab    Staff Present Cyndia Diver, RN, BSN, MA;Maxon Conetta BS, , Exercise Physiologist;Megan Katrinka Blazing, RN, Silas Flood, BS, Exercise Physiologist    Virtual Visit No    Medication changes reported     No    Fall or balance concerns reported    No    Tobacco Cessation No Change    Warm-up and Cool-down Performed on first and last piece of equipment    Resistance Training Performed Yes    VAD Patient? No    PAD/SET Patient? No      Pain Assessment   Currently in Pain? No/denies                Social History   Tobacco Use  Smoking Status Former   Current packs/day: 0.00   Average packs/day: 1 pack/day for 55.0 years (55.0 ttl pk-yrs)   Types: Cigarettes   Start date: 17   Quit date: 2009   Years since quitting: 15.6  Smokeless Tobacco Never    Goals Met:  Independence with exercise equipment Exercise tolerated well No report of concerns or symptoms today  Goals Unmet:  Not Applicable  Comments: Pt able to follow exercise prescription today without complaint.  Will continue to monitor for progression.    Dr. Bethann Punches is Medical Director for [facility] Cardiac Rehabilitation.  Dr. Vida Rigger is Medical Director for [facility] Pulmonary Rehabilitation.

## 2022-12-15 ENCOUNTER — Encounter: Payer: Medicare PPO | Admitting: *Deleted

## 2022-12-15 VITALS — Ht 68.0 in | Wt 139.5 lb

## 2022-12-15 DIAGNOSIS — Z5189 Encounter for other specified aftercare: Secondary | ICD-10-CM | POA: Diagnosis not present

## 2022-12-15 DIAGNOSIS — J449 Chronic obstructive pulmonary disease, unspecified: Secondary | ICD-10-CM

## 2022-12-15 NOTE — Progress Notes (Signed)
Daily Session Note  Patient Details  Name: Bridget Deleon MRN: [id number] Date of Birth: [birth year] Referring Provider:   Flowsheet Row Pulmonary Rehab from 08/19/2022 in [facility] Cardiac and Pulmonary Rehab  Referring Provider Vida Rigger MD       Encounter Date: 12/15/2022  Check In:  Session Check In - 12/15/22 1558       Check-In   Supervising physician immediately available to respond to emergencies See telemetry face sheet for immediately available ER MD    Location [facility]-Cardiac & Pulmonary Rehab    Staff Present Bess Kinds RN, BSN;Donnia Poplaski BS, , Exercise Physiologist;Joseph [place], [place]    Virtual Visit No    Medication changes reported     No    Fall or balance concerns reported    No    Warm-up and Cool-down Performed on first and last piece of equipment    Resistance Training Performed Yes    VAD Patient? No    PAD/SET Patient? No      Pain Assessment   Currently in Pain? No/denies               Exercise Prescription Changes - 12/15/22 1700       Oxygen   Maintain Oxygen Saturation 88% or higher             Social History   Tobacco Use  Smoking Status Former   Current packs/day: 0.00   Average packs/day: 1 pack/day for 55.0 years (55.0 ttl pk-yrs)   Types: Cigarettes   Start date: 78   Quit date: 2009   Years since quitting: 15.6  Smokeless Tobacco Never    Goals Met:  Independence with exercise equipment Exercise tolerated well No report of concerns or symptoms today Strength training completed today  Goals Unmet:  Not Applicable  Comments: Pt able to follow exercise prescription today without complaint.  Will continue to monitor for progression.  6 Minute Walk     Row Name 08/19/22 1624 12/15/22 1655       6 Minute Walk   Phase Initial Discharge    Distance 570 feet 850 feet    Distance % Change -- 49 %    Distance Feet Change -- 280 ft    Walk Time 6 minutes 6 minutes    # of Rest Breaks 0 0    MPH 1.07  1.61    METS 1.48 2.05    RPE 15 13    Perceived Dyspnea  3 0    VO2 Peak 5.21 7.2    Symptoms Yes (comment) No    Comments SOB, general fatigue --    Resting HR 72 bpm 90 bpm    Resting BP 122/68 102/50    Resting Oxygen Saturation  96 % 95 %    Exercise Oxygen Saturation  during 6 min walk 94 % 91 %    Max Ex. HR 89 bpm 104 bpm    Max Ex. BP 142/68 138/62    2 Minute Post BP 124/68 122/54      Interval HR   1 Minute HR 85 100    2 Minute HR 88 104    3 Minute HR 87 104    4 Minute HR 89 104    5 Minute HR 88 104    6 Minute HR 87 104    2 Minute Post HR 70 88    Interval Heart Rate? Yes Yes      Interval Oxygen   Interval Oxygen?  Yes Yes    Baseline Oxygen Saturation % 96 % 95 %    1 Minute Oxygen Saturation % 94 % 93 %    1 Minute Liters of Oxygen 0 L  RA --    2 Minute Oxygen Saturation % 94 % 91 %    2 Minute Liters of Oxygen 0 L --    3 Minute Oxygen Saturation % 94 % 91 %    3 Minute Liters of Oxygen 0 L --    4 Minute Oxygen Saturation % 97 % 93 %    4 Minute Liters of Oxygen 0 L --    5 Minute Oxygen Saturation % 97 % 93 %    5 Minute Liters of Oxygen 0 L --    6 Minute Oxygen Saturation % 97 % 94 %    6 Minute Liters of Oxygen 0 L --    2 Minute Post Oxygen Saturation % 98 % 97 %    2 Minute Post Liters of Oxygen 0 L --               Dr. Bethann Punches is Medical Director for [facility] Cardiac Rehabilitation.  Dr. Vida Rigger is Medical Director for [facility] Pulmonary Rehabilitation.

## 2022-12-15 NOTE — Patient Instructions (Signed)
Discharge Patient Instructions  Patient Details  Name: Bridget Deleon MRN: [id number] Date of Birth: Jul 17, 1940 Referring Provider:  Barbette Reichmann, MD   Number of Visits: 79  Reason for Discharge:  Patient reached a stable level of exercise. Patient independent in their exercise. Patient has met program and personal goals.  Diagnosis:  Stage 2 moderate COPD by GOLD classification (HCC)  Initial Exercise Prescription:  Initial Exercise Prescription - 08/19/22 1600       Date of Initial Exercise RX and Referring Provider   Date 08/19/22    Referring Provider Vida Rigger MD      Oxygen   Maintain Oxygen Saturation 88% or higher      NuStep   Level 1    SPM 80    Minutes 15    METs 1.4      Biostep-RELP   Level 1    SPM 50    Minutes 15    METs 1.4      Track   Laps 7    Minutes 15    METs 1.44      Prescription Details   Frequency (times per week) 2    Duration Progress to 30 minutes of continuous aerobic without signs/symptoms of physical distress      Intensity   THRR 40-80% of Max Heartrate 99 - 126    Ratings of Perceived Exertion 11-13    Perceived Dyspnea 0-4      Progression   Progression Continue to progress workloads to maintain intensity without signs/symptoms of physical distress.      Resistance Training   Training Prescription Yes    Weight 2 lb    Reps 10-15             Discharge Exercise Prescription (Final Exercise Prescription Changes):  Exercise Prescription Changes - 12/15/22 1700       Oxygen   Maintain Oxygen Saturation 88% or higher             Functional Capacity:  6 Minute Walk     Row Name 08/19/22 1624 12/15/22 1655       6 Minute Walk   Phase Initial Discharge    Distance 570 feet 850 feet    Distance % Change -- 49 %    Distance Feet Change -- 280 ft    Walk Time 6 minutes 6 minutes    # of Rest Breaks 0 0    MPH 1.07 1.61    METS 1.48 2.05    RPE 15 13    Perceived Dyspnea  3 0    VO2  Peak 5.21 7.2    Symptoms Yes (comment) No    Comments SOB, general fatigue --    Resting HR 72 bpm 90 bpm    Resting BP 122/68 102/50    Resting Oxygen Saturation  96 % 95 %    Exercise Oxygen Saturation  during 6 min walk 94 % 91 %    Max Ex. HR 89 bpm 104 bpm    Max Ex. BP 142/68 138/62    2 Minute Post BP 124/68 122/54      Interval HR   1 Minute HR 85 100    2 Minute HR 88 104    3 Minute HR 87 104    4 Minute HR 89 104    5 Minute HR 88 104    6 Minute HR 87 104    2 Minute Post HR 70 88    Interval  Heart Rate? Yes Yes      Interval Oxygen   Interval Oxygen? Yes Yes    Baseline Oxygen Saturation % 96 % 95 %    1 Minute Oxygen Saturation % 94 % 93 %    1 Minute Liters of Oxygen 0 L  RA --    2 Minute Oxygen Saturation % 94 % 91 %    2 Minute Liters of Oxygen 0 L --    3 Minute Oxygen Saturation % 94 % 91 %    3 Minute Liters of Oxygen 0 L --    4 Minute Oxygen Saturation % 97 % 93 %    4 Minute Liters of Oxygen 0 L --    5 Minute Oxygen Saturation % 97 % 93 %    5 Minute Liters of Oxygen 0 L --    6 Minute Oxygen Saturation % 97 % 94 %    6 Minute Liters of Oxygen 0 L --    2 Minute Post Oxygen Saturation % 98 % 97 %    2 Minute Post Liters of Oxygen 0 L --             Nutrition & Weight - Outcomes:  Pre Biometrics - 08/19/22 1624       Pre Biometrics   Height 5\' 8"  (1.727 m)    Weight 134 lb (60.8 kg)    Waist Circumference 31 inches    Hip Circumference 38 inches    Waist to Hip Ratio 0.82 %    BMI (Calculated) 20.38    Single Leg Stand 2.5 seconds             Post Biometrics - 12/15/22 1702        Post  Biometrics   Height 5\' 8"  (1.727 m)    Weight 139 lb 8 oz (63.3 kg)    Waist Circumference 32 inches    Hip Circumference 40.5 inches    Waist to Hip Ratio 0.79 %    BMI (Calculated) 21.22    Single Leg Stand 2.5 seconds             Nutrition:  Nutrition Therapy & Goals - 08/19/22 1616       Intervention Plan   Intervention  Prescribe, educate and counsel regarding individualized specific dietary modifications aiming towards targeted core components such as weight, hypertension, lipid management, diabetes, heart failure and other comorbidities.    Expected Outcomes Short Term Goal: Understand basic principles of dietary content, such as calories, fat, sodium, cholesterol and nutrients.;Short Term Goal: A plan has been developed with personal nutrition goals set during dietitian appointment.;Long Term Goal: Adherence to prescribed nutrition plan.             Goals reviewed with patient; copy given to patient.

## 2022-12-15 NOTE — Progress Notes (Signed)
Daily Session Note  Patient Details  Name: Bridget Deleon MRN: [id number] Date of Birth: 1940-08-28 Referring Provider:   Flowsheet Row Pulmonary Rehab from 08/19/2022 in [facility] Cardiac and Pulmonary Rehab  Referring Provider Vida Rigger MD       Encounter Date: 12/15/2022  Check In:  Session Check In - 12/15/22 1558       Check-In   Supervising physician immediately available to respond to emergencies See telemetry face sheet for immediately available ER MD    Location [facility]-Cardiac & Pulmonary Rehab    Staff Present Bess Kinds RN, BSN;Maxon Conetta BS, , Exercise Physiologist;Joseph [place], [place]    Virtual Visit No    Medication changes reported     No    Fall or balance concerns reported    No    Warm-up and Cool-down Performed on first and last piece of equipment    Resistance Training Performed Yes    VAD Patient? No    PAD/SET Patient? No      Pain Assessment   Currently in Pain? No/denies                Social History   Tobacco Use  Smoking Status Former   Current packs/day: 0.00   Average packs/day: 1 pack/day for 55.0 years (55.0 ttl pk-yrs)   Types: Cigarettes   Start date: 58   Quit date: 2009   Years since quitting: 15.6  Smokeless Tobacco Never    Goals Met:  Independence with exercise equipment Exercise tolerated well No report of concerns or symptoms today Strength training completed today  Goals Unmet:  Not Applicable  Comments: Pt able to follow exercise prescription today without complaint.  Will continue to monitor for progression.    Dr. Bethann Punches is Medical Director for [facility] Cardiac Rehabilitation.  Dr. Vida Rigger is Medical Director for [facility] Pulmonary Rehabilitation.

## 2022-12-16 ENCOUNTER — Ambulatory Visit: Payer: BC Managed Care – PPO

## 2022-12-22 ENCOUNTER — Encounter: Payer: BC Managed Care – PPO | Attending: Pulmonary Disease | Admitting: *Deleted

## 2022-12-22 ENCOUNTER — Encounter: Payer: Self-pay | Admitting: *Deleted

## 2022-12-22 DIAGNOSIS — R0609 Other forms of dyspnea: Secondary | ICD-10-CM | POA: Diagnosis not present

## 2022-12-22 DIAGNOSIS — J449 Chronic obstructive pulmonary disease, unspecified: Secondary | ICD-10-CM | POA: Insufficient documentation

## 2022-12-22 NOTE — Progress Notes (Signed)
Pulmonary Individual Treatment Plan  Patient Details  Name: Bridget Deleon MRN: [id number] Date of Birth: 11-23-1940 Referring Provider:   Flowsheet Row Pulmonary Rehab from 08/19/2022 in [facility] Cardiac and Pulmonary Rehab  Referring Provider Vida Rigger MD       Initial Encounter Date:  Flowsheet Row Pulmonary Rehab from 08/19/2022 in [facility] Cardiac and Pulmonary Rehab  Date 08/19/22       Visit Diagnosis: Stage 2 moderate COPD by GOLD classification (HCC)  Patient's Home Medications on Admission:  Current Outpatient Medications:    alendronate (FOSAMAX) 70 MG tablet, Take 70 mg by mouth every 7 (seven) days. On Saturday.  Take with a full glass of water on an empty stomach., Disp: , Rfl:    ALPRAZolam (XANAX) 0.25 MG tablet, Take by mouth., Disp: , Rfl:    benzonatate (TESSALON) 200 MG capsule, Take 200 mg by mouth 3 (three) times daily as needed for cough., Disp: , Rfl:    Calcium Carbonate-Vitamin D (CALCIUM-D) 600-400 MG-UNIT TABS, Take 1 tablet by mouth every morning., Disp: , Rfl:    cholecalciferol (VITAMIN D) 1000 UNITS tablet, Take 1,000 Units by mouth every morning., Disp: , Rfl:    CRANBERRY PO, Take 1,500 mg by mouth every morning., Disp: , Rfl:    dicyclomine (BENTYL) 20 MG tablet, Take 20 mg by mouth 3 (three) times daily as needed for spasms., Disp: , Rfl:    diphenhydramine-acetaminophen (TYLENOL PM) 25-500 MG TABS tablet, Take 1 tablet by mouth at bedtime as needed., Disp: , Rfl:    famotidine (PEPCID) 20 MG tablet, Take by mouth., Disp: , Rfl:    fluticasone (FLONASE) 50 MCG/ACT nasal spray, Place 1 spray into both nostrils daily., Disp: , Rfl:    gentamicin cream (GARAMYCIN) 0.1 %, Apply 1 application topically 3 (three) times daily as needed., Disp: , Rfl:    guaiFENesin-dextromethorphan (ROBITUSSIN DM) 100-10 MG/5ML syrup, Take 5 mLs by mouth every 4 (four) hours as needed for cough., Disp: 118 mL, Rfl: 0   hydrocortisone 2.5 % cream, Apply topically 2 (two)  times daily as needed., Disp: , Rfl:    ibuprofen (ADVIL,MOTRIN) 800 MG tablet, Take 800 mg by mouth 2 (two) times daily as needed. For pain, Disp: , Rfl:    losartan (COZAAR) 100 MG tablet, Take by mouth., Disp: , Rfl:    methocarbamol (ROBAXIN) 500 MG tablet, Take 500 mg by mouth 3 (three) times daily as needed for muscle spasms., Disp: , Rfl:    NIFEdipine (PROCARDIA-XL/NIFEDICAL-XL) 30 MG 24 hr tablet, Take 30 mg by mouth daily., Disp: , Rfl:    ondansetron (ZOFRAN-ODT) 8 MG disintegrating tablet, Take 8 mg by mouth every 8 (eight) hours as needed for nausea or vomiting., Disp: , Rfl:    oxyCODONE-acetaminophen (PERCOCET/ROXICET) 5-325 MG tablet, Take by mouth every 4 (four) hours as needed for severe pain., Disp: , Rfl:    senna-docusate (SENOKOT-S) 8.6-50 MG per tablet, Take 2 tablets by mouth at bedtime as needed. For constipation, Disp: , Rfl:    sertraline (ZOLOFT) 50 MG tablet, Take 1 tablet by mouth daily., Disp: , Rfl:    torsemide (DEMADEX) 20 MG tablet, Take 20 mg by mouth 3 (three) times a week., Disp: , Rfl:    traMADol (ULTRAM) 50 MG tablet, Take by mouth every 6 (six) hours as needed., Disp: , Rfl:    TRELEGY ELLIPTA 100-62.5-25 MCG/ACT AEPB, Inhale 1 puff into the lungs daily., Disp: , Rfl:    triamcinolone cream (KENALOG) 0.1 %,  Apply 1 application topically 2 (two) times daily as needed., Disp: , Rfl:   Past Medical History: Past Medical History:  Diagnosis Date   Arthritis    osteoarthritis   Cancer (HCC) 2010   right lower lobe cancer    Complication of anesthesia 23 yrs ago .    woke up on operating table, also A-fib for short time after Lung surgery   Coronary artery disease 2010   woke up and states she was told  bottom of heart was asleep ... takes diltiazem for that reason .   GERD (gastroesophageal reflux disease)    Ovarian ca (HCC) 1972   Wears dentures    full upper and lower    Tobacco Use: Social History   Tobacco Use  Smoking Status Former    Current packs/day: 0.00   Average packs/day: 1 pack/day for 55.0 years (55.0 ttl pk-yrs)   Types: Cigarettes   Start date: 23   Quit date: 2009   Years since quitting: 15.6  Smokeless Tobacco Never    Labs: Review Flowsheet        No data to display           Pulmonary Assessment Scores:  Pulmonary Assessment Scores     Row Name 08/19/22 1611         ADL UCSD   ADL Phase Entry     SOB Score total 74     Rest 0     Walk 2     Stairs 5     Bath 3     Dress 3     Shop 5       CAT Score   CAT Score 13       mMRC Score   mMRC Score 3              UCSD: Self-administered rating of dyspnea associated with activities of daily living (ADLs) 6-point scale (0 = "not at all" to 5 = "maximal or unable to do because of breathlessness")  Scoring Scores range from 0 to 120.  Minimally important difference is 5 units  CAT: CAT can identify the health impairment of COPD patients and is better correlated with disease progression.  CAT has a scoring range of zero to 40. The CAT score is classified into four groups of low (less than 10), medium (10 - 20), high (21-30) and very high (31-40) based on the impact level of disease on health status. A CAT score over 10 suggests significant symptoms.  A worsening CAT score could be explained by an exacerbation, poor medication adherence, poor inhaler technique, or progression of COPD or comorbid conditions.  CAT MCID is 2 points  mMRC: mMRC (Modified Medical Research Council) Dyspnea Scale is used to assess the degree of baseline functional disability in patients of respiratory disease due to dyspnea. No minimal important difference is established. A decrease in score of 1 point or greater is considered a positive change.   Pulmonary Function Assessment:  Pulmonary Function Assessment - 08/19/22 1618       Pulmonary Function Tests   FEV1% 53 %    FEV1/FVC Ratio 58.9   Taken from PFT 07/20/22 ([facility])     Post Bronchodilator  Spirometry Results   FEV1% 52 %    FEV1/FVC Ratio 55.97             Exercise Target Goals: Exercise Program Goal: Individual exercise prescription set using results from initial 6 min walk test and THRR while  considering  patient's activity barriers and safety.   Exercise Prescription Goal: Initial exercise prescription builds to 30-45 minutes a day of aerobic activity, 2-3 days per week.  Home exercise guidelines will be given to patient during program as part of exercise prescription that the participant will acknowledge.  Education: Aerobic Exercise: - Group verbal and visual presentation on the components of exercise prescription. Introduces F.I.T.T principle from ACSM for exercise prescriptions.  Reviews F.I.T.T. principles of aerobic exercise including progression. Written material given at graduation.   Education: Resistance Exercise: - Group verbal and visual presentation on the components of exercise prescription. Introduces F.I.T.T principle from ACSM for exercise prescriptions  Reviews F.I.T.T. principles of resistance exercise including progression. Written material given at graduation.    Education: Exercise & Equipment Safety: - Individual verbal instruction and demonstration of equipment use and safety with use of the equipment. Flowsheet Row Pulmonary Rehab from 09/15/2022 in [facility] Cardiac and Pulmonary Rehab  Education need identified 08/19/22  Date 08/19/22  Educator KW  Instruction Review Code 1- Verbalizes Understanding       Education: Exercise Physiology & General Exercise Guidelines: - Group verbal and written instruction with models to review the exercise physiology of the cardiovascular system and associated critical values. Provides general exercise guidelines with specific guidelines to those with heart or lung disease.  Flowsheet Row Pulmonary Rehab from 09/15/2022 in [facility] Cardiac and Pulmonary Rehab  Date 09/15/22  Educator [facility]  Instruction Review  Code 1- Verbalizes Understanding       Education: Flexibility, Balance, Mind/Body Relaxation: - Group verbal and visual presentation with interactive activity on the components of exercise prescription. Introduces F.I.T.T principle from ACSM for exercise prescriptions. Reviews F.I.T.T. principles of flexibility and balance exercise training including progression. Also discusses the mind body connection.  Reviews various relaxation techniques to help reduce and manage stress (i.e. Deep breathing, progressive muscle relaxation, and visualization). Balance handout provided to take home. Written material given at graduation.   Activity Barriers & Risk Stratification:  Activity Barriers & Cardiac Risk Stratification - 08/19/22 1624       Activity Barriers & Cardiac Risk Stratification   Activity Barriers Arthritis;Balance Concerns;Deconditioning;Muscular Weakness;Assistive Device;Shortness of Breath             6 Minute Walk:  6 Minute Walk     Row Name 08/19/22 1624 12/15/22 1655       6 Minute Walk   Phase Initial Discharge    Distance 570 feet 850 feet    Distance % Change -- 49 %    Distance Feet Change -- 280 ft    Walk Time 6 minutes 6 minutes    # of Rest Breaks 0 0    MPH 1.07 1.61    METS 1.48 2.05    RPE 15 13    Perceived Dyspnea  3 0    VO2 Peak 5.21 7.2    Symptoms Yes (comment) No    Comments SOB, general fatigue --    Resting HR 72 bpm 90 bpm    Resting BP 122/68 102/50    Resting Oxygen Saturation  96 % 95 %    Exercise Oxygen Saturation  during 6 min walk 94 % 91 %    Max Ex. HR 89 bpm 104 bpm    Max Ex. BP 142/68 138/62    2 Minute Post BP 124/68 122/54      Interval HR   1 Minute HR 85 100    2 Minute HR 88  104    3 Minute HR 87 104    4 Minute HR 89 104    5 Minute HR 88 104    6 Minute HR 87 104    2 Minute Post HR 70 88    Interval Heart Rate? Yes Yes      Interval Oxygen   Interval Oxygen? Yes Yes    Baseline Oxygen Saturation % 96 % 95  %    1 Minute Oxygen Saturation % 94 % 93 %    1 Minute Liters of Oxygen 0 L  RA --    2 Minute Oxygen Saturation % 94 % 91 %    2 Minute Liters of Oxygen 0 L --    3 Minute Oxygen Saturation % 94 % 91 %    3 Minute Liters of Oxygen 0 L --    4 Minute Oxygen Saturation % 97 % 93 %    4 Minute Liters of Oxygen 0 L --    5 Minute Oxygen Saturation % 97 % 93 %    5 Minute Liters of Oxygen 0 L --    6 Minute Oxygen Saturation % 97 % 94 %    6 Minute Liters of Oxygen 0 L --    2 Minute Post Oxygen Saturation % 98 % 97 %    2 Minute Post Liters of Oxygen 0 L --            Oxygen Initial Assessment:  Oxygen Initial Assessment - 08/19/22 1611       Home Oxygen   Home Oxygen Device None    Sleep Oxygen Prescription None    Home Exercise Oxygen Prescription None    Home Resting Oxygen Prescription None      Initial 6 min Walk   Oxygen Used None      Program Oxygen Prescription   Program Oxygen Prescription None      Intervention   Short Term Goals To learn and demonstrate proper pursed lip breathing techniques or other breathing techniques. ;To learn and demonstrate proper use of respiratory medications    Long  Term Goals Compliance with respiratory medication;Demonstrates proper use of MDI's;Exhibits proper breathing techniques, such as pursed lip breathing or other method taught during program session             Oxygen Re-Evaluation:  Oxygen Re-Evaluation     Row Name 08/25/22 1538 09/15/22 1609 11/08/22 1542         Program Oxygen Prescription   Program Oxygen Prescription -- None None       Home Oxygen   Home Oxygen Device -- None None     Sleep Oxygen Prescription -- None None     Home Exercise Oxygen Prescription -- None None     Home Resting Oxygen Prescription -- None None       Goals/Expected Outcomes   Short Term Goals -- To learn and demonstrate proper use of respiratory medications;To learn and understand importance of maintaining oxygen  saturations>88% To learn and demonstrate proper pursed lip breathing techniques or other breathing techniques.      Long  Term Goals -- Maintenance of O2 saturations>88%;Demonstrates proper use of MDI's;Verbalizes importance of monitoring SPO2 with pulse oximeter and return demonstration Exhibits proper breathing techniques, such as pursed lip breathing or other method taught during program session     Comments Reviewed PLB technique with pt.  Talked about how it works and it's importance in maintaining their exercise saturations. She does  not have a pulse oximeter to check her/his oxygen saturation at home. Informed her where to get one and explained why it is important to have one. Reviewed that oxygen saturations should be 88 percent and above. Diaphragmatic and PLB breathing explained and performed with patient. Patient has a better understanding of how to do these exercises to help with breathing performance and relaxation. Patient performed breathing techniques adequately and to practice further at home.     Goals/Expected Outcomes Short: Become more profiecient at using PLB.   Long: Become independent at using PLB. Short: monitor oxygen at home with exertion. Long: maintain oxygen saturations above 88 percent independently. Short: practice PLB and diaphragmatic breathing at home. Long: Use PLB and diaphragmatic breathing independently post [facility].              Oxygen Discharge (Final Oxygen Re-Evaluation):  Oxygen Re-Evaluation - 11/08/22 1542       Program Oxygen Prescription   Program Oxygen Prescription None      Home Oxygen   Home Oxygen Device None    Sleep Oxygen Prescription None    Home Exercise Oxygen Prescription None    Home Resting Oxygen Prescription None      Goals/Expected Outcomes   Short Term Goals To learn and demonstrate proper pursed lip breathing techniques or other breathing techniques.     Long  Term Goals Exhibits proper breathing techniques, such as pursed  lip breathing or other method taught during program session    Comments Diaphragmatic and PLB breathing explained and performed with patient. Patient has a better understanding of how to do these exercises to help with breathing performance and relaxation. Patient performed breathing techniques adequately and to practice further at home.    Goals/Expected Outcomes Short: practice PLB and diaphragmatic breathing at home. Long: Use PLB and diaphragmatic breathing independently post [facility].             Initial Exercise Prescription:  Initial Exercise Prescription - 08/19/22 1600       Date of Initial Exercise RX and Referring Provider   Date 08/19/22    Referring Provider Vida Rigger MD      Oxygen   Maintain Oxygen Saturation 88% or higher      NuStep   Level 1    SPM 80    Minutes 15    METs 1.4      Biostep-RELP   Level 1    SPM 50    Minutes 15    METs 1.4      Track   Laps 7    Minutes 15    METs 1.44      Prescription Details   Frequency (times per week) 2    Duration Progress to 30 minutes of continuous aerobic without signs/symptoms of physical distress      Intensity   THRR 40-80% of Max Heartrate 99 - 126    Ratings of Perceived Exertion 11-13    Perceived Dyspnea 0-4      Progression   Progression Continue to progress workloads to maintain intensity without signs/symptoms of physical distress.      Resistance Training   Training Prescription Yes    Weight 2 lb    Reps 10-15             Perform Capillary Blood Glucose checks as needed.  Exercise Prescription Changes:   Exercise Prescription Changes     Row Name 08/19/22 1600 08/26/22 0900 09/06/22 1300 09/21/22 0700 10/04/22 1400  Response to Exercise   Blood Pressure (Admit) 122/68 130/70 110/60 118/58 120/60   Blood Pressure (Exercise) 142/68 130/60 138/68 110/54 142/68   Blood Pressure (Exit) 124/68 110/62 118/58 110/60 114/60   Heart Rate (Admit) 72 bpm 67 bpm 73 bpm 70 bpm  70 bpm   Heart Rate (Exercise) 89 bpm 94 bpm 85 bpm 94 bpm 94 bpm   Heart Rate (Exit) 70 bpm 80 bpm 67 bpm 70 bpm 70 bpm   Oxygen Saturation (Admit) 96 % 97 % 98 % 96 % 93 %   Oxygen Saturation (Exercise) 94 % 95 % 88 % 96 % 92 %   Oxygen Saturation (Exit) 97 % 97 % 98 % 97 % 96 %   Rating of Perceived Exertion (Exercise) 15 15 13 13 14    Perceived Dyspnea (Exercise) 3 0 3 2 2    Symptoms SOB, fatigue none SOB SOB SOB   Comments walk test results 1st full day of exercise -- -- --   Duration -- Progress to 30 minutes of  aerobic without signs/symptoms of physical distress Progress to 30 minutes of  aerobic without signs/symptoms of physical distress Progress to 30 minutes of  aerobic without signs/symptoms of physical distress Progress to 30 minutes of  aerobic without signs/symptoms of physical distress   Intensity -- THRR unchanged THRR unchanged THRR unchanged THRR unchanged     Progression   Progression -- Continue to progress workloads to maintain intensity without signs/symptoms of physical distress. Continue to progress workloads to maintain intensity without signs/symptoms of physical distress. Continue to progress workloads to maintain intensity without signs/symptoms of physical distress. Continue to progress workloads to maintain intensity without signs/symptoms of physical distress.   Average METs -- 2.07 2.09 2.31 2.12     Resistance Training   Training Prescription -- Yes Yes Yes Yes   Weight -- 2 lb 2 lb 2 lb 2 lb   Reps -- 10-15 10-15 10-15 10-15     Interval Training   Interval Training -- No No No No     Recumbant Bike   Level -- -- 1 -- --   Watts -- -- 11 -- --   Minutes -- -- 15 -- --     NuStep   Level -- -- 1 1 1    Minutes -- -- 15 15 15    METs -- -- -- -- 1.6     Biostep-RELP   Level -- 1 -- 1 2   Minutes -- 15 -- 15 15   METs -- 2 -- 2 2     Track   Laps -- 21 24 30 30    Minutes -- 15 15 15 15    METs -- 2.14 2.31 2.63 2.63     Oxygen   Maintain  Oxygen Saturation -- 88% or higher 88% or higher 88% or higher 88% or higher    Row Name 10/22/22 0800 11/04/22 0700 11/18/22 0700 11/30/22 1500 12/13/22 1300     Response to Exercise   Blood Pressure (Admit) 118/62 110/60 98/52 120/54 130/56   Blood Pressure (Exit) 114/58 100/60 120/60 100/42 107/60   Heart Rate (Admit) 71 bpm 80 bpm 78 bpm 74 bpm 78 bpm   Heart Rate (Exercise) 96 bpm 97 bpm 103 bpm 98 bpm 97 bpm   Heart Rate (Exit) 91 bpm 80 bpm 89 bpm 83 bpm 80 bpm   Oxygen Saturation (Admit) 96 % 95 % 93 % 96 % 94 %   Oxygen Saturation (Exercise) 92 % 91 %  91 % 93 % 92 %   Oxygen Saturation (Exit) 96 % 95 % 95 % 95 % 97 %   Rating of Perceived Exertion (Exercise) 15 13 13 14 14    Perceived Dyspnea (Exercise) 3 2 3 2 2    Symptoms SOB SOB SOB SOB SOB   Duration Progress to 30 minutes of  aerobic without signs/symptoms of physical distress Progress to 30 minutes of  aerobic without signs/symptoms of physical distress Progress to 30 minutes of  aerobic without signs/symptoms of physical distress Progress to 30 minutes of  aerobic without signs/symptoms of physical distress Progress to 30 minutes of  aerobic without signs/symptoms of physical distress   Intensity THRR unchanged THRR unchanged THRR unchanged THRR unchanged THRR unchanged     Progression   Progression Continue to progress workloads to maintain intensity without signs/symptoms of physical distress. Continue to progress workloads to maintain intensity without signs/symptoms of physical distress. Continue to progress workloads to maintain intensity without signs/symptoms of physical distress. Continue to progress workloads to maintain intensity without signs/symptoms of physical distress. Continue to progress workloads to maintain intensity without signs/symptoms of physical distress.   Average METs 2.17 2.33 1.89 2.33 2.5     Resistance Training   Training Prescription Yes Yes Yes Yes Yes   Weight 2 lb 3 lb 3 lb 5 lb 5 lb   Reps  10-15 10-15 10-15 10-15 10-15     Interval Training   Interval Training No No No No No     Recumbant Bike   Level -- 1 -- 1 --   Watts -- 15 -- 25 --   Minutes -- 15 -- 15 --   METs -- -- -- 3.25 --     NuStep   Level 2 -- 2 -- 5   Minutes 15 -- 15 -- 15   METs 2 -- 1.4 -- 2.2     Arm Ergometer   Level 1 1 1  -- --   Minutes 15 15 15  -- --   METs 1.6 2 1  -- --     T5 Nustep   Level -- -- 1 -- --   Minutes -- -- 15 -- --   METs -- -- 1.8 -- --     Biostep-RELP   Level 2 2 3 4 4    Minutes 15 15 15 15 15    METs 2 2 2 2 3      Track   Laps 30 30 25 24 27    Minutes 15 15 15 15 15    METs 2.63 2.63 2.36 2.31 2.47     Oxygen   Maintain Oxygen Saturation 88% or higher 88% or higher 88% or higher 88% or higher 88% or higher    Row Name 12/15/22 1700             Oxygen   Maintain Oxygen Saturation 88% or higher                Exercise Comments:   Exercise Comments     Row Name 08/25/22 1536           Exercise Comments First full day of exercise!  Patient was oriented to gym and equipment including functions, settings, policies, and procedures.  Patient's individual exercise prescription and treatment plan were reviewed.  All starting workloads were established based on the results of the 6 minute walk test done at initial orientation visit.  The plan for exercise progression was also introduced and progression will be customized based  on patient's performance and goals.                Exercise Goals and Review:   Exercise Goals     Row Name 08/19/22 1636             Exercise Goals   Increase Physical Activity Yes       Intervention Provide advice, education, support and counseling about physical activity/exercise needs.;Develop an individualized exercise prescription for aerobic and resistive training based on initial evaluation findings, risk stratification, comorbidities and participant's personal goals.       Expected Outcomes Short Term:  Attend rehab on a regular basis to increase amount of physical activity.;Long Term: Add in home exercise to make exercise part of routine and to increase amount of physical activity.;Long Term: Exercising regularly at least 3-5 days a week.       Increase Strength and Stamina Yes       Intervention Provide advice, education, support and counseling about physical activity/exercise needs.;Develop an individualized exercise prescription for aerobic and resistive training based on initial evaluation findings, risk stratification, comorbidities and participant's personal goals.       Expected Outcomes Short Term: Perform resistance training exercises routinely during rehab and add in resistance training at home;Short Term: Increase workloads from initial exercise prescription for resistance, speed, and METs.;Long Term: Improve cardiorespiratory fitness, muscular endurance and strength as measured by increased METs and functional capacity ( )       Able to understand and use rate of perceived exertion (RPE) scale Yes       Intervention Provide education and explanation on how to use RPE scale       Expected Outcomes Short Term: Able to use RPE daily in rehab to express subjective intensity level;Long Term:  Able to use RPE to guide intensity level when exercising independently       Able to understand and use Dyspnea scale Yes       Intervention Provide education and explanation on how to use Dyspnea scale       Expected Outcomes Short Term: Able to use Dyspnea scale daily in rehab to express subjective sense of shortness of breath during exertion;Long Term: Able to use Dyspnea scale to guide intensity level when exercising independently       Knowledge and understanding of Target Heart Rate Range (THRR) Yes       Intervention Provide education and explanation of THRR including how the numbers were predicted and where they are located for reference       Expected Outcomes Short Term: Able to state/look up  THRR;Long Term: Able to use THRR to govern intensity when exercising independently;Short Term: Able to use daily as guideline for intensity in rehab       Able to check pulse independently Yes       Intervention Provide education and demonstration on how to check pulse in carotid and radial arteries.;Review the importance of being able to check your own pulse for safety during independent exercise       Expected Outcomes Long Term: Able to check pulse independently and accurately;Short Term: Able to explain why pulse checking is important during independent exercise       Understanding of Exercise Prescription Yes       Intervention Provide education, explanation, and written materials on patient's individual exercise prescription       Expected Outcomes Short Term: Able to explain program exercise prescription;Long Term: Able to explain home exercise prescription to exercise independently  Exercise Goals Re-Evaluation :  Exercise Goals Re-Evaluation     Row Name 08/25/22 1538 08/26/22 0932 09/06/22 1401 09/15/22 1622 09/21/22 0719     Exercise Goal Re-Evaluation   Exercise Goals Review Increase Physical Activity;Able to understand and use rate of perceived exertion (RPE) scale;Knowledge and understanding of Target Heart Rate Range (THRR);Understanding of Exercise Prescription;Increase Strength and Stamina;Able to understand and use Dyspnea scale;Able to check pulse independently Increase Physical Activity;Increase Strength and Stamina;Understanding of Exercise Prescription Increase Physical Activity;Increase Strength and Stamina;Understanding of Exercise Prescription Increase Physical Activity;Increase Strength and Stamina;Understanding of Exercise Prescription Increase Physical Activity;Increase Strength and Stamina;Understanding of Exercise Prescription   Comments Reviewed RPE scale, THR and program prescription with pt today.  Pt voiced understanding and was given a copy of  goals to take home. Ann did well for her first session here at rehab. She was able to complete 21 laps on the track! Her RPEs are appropriate and O2 saturations stayed above 88%. We will continue to monitor as she progresses in the program. Bridget Deleon is doing well in rehab. She recently increased her number of laps on the track up to 24 laps. She also has continued to work at level 1 on the T4 nustep and recumbent bike. She has done well with 2 lb hand weights for resistance training as well. We will continue to monitor her progress in the program. Bridget Deleon is up to 27 laps on the track. She would like to do the XR next time she is in class. She states that the biostep is very hard for her. Bridget Deleon is doing well in rehab.  She is up to 30 laps.  Her saturations are still doing well.  We will encourage her to try to increased seated equipment to level 2.  We will continue to monitor her progress.   Expected Outcomes Short: Use RPE daily to regulate intensity.  Long: Follow program prescription in THR. Short: Continue initial exercise prescription Long: Build up overall strength and stamina Short: Begin to progressively increase workloads. Long: Continue to improve strength and stamina. Short: work on the XR. Long: maintain exercise post [facility]. Short: Increase seated workloads Long: Conitnue to improve stamina.    Row Name 10/04/22 1421 10/22/22 0818 11/04/22 0742 11/18/22 0753 11/30/22 1504     Exercise Goal Re-Evaluation   Exercise Goals Review Increase Physical Activity;Increase Strength and Stamina;Understanding of Exercise Prescription Increase Physical Activity;Increase Strength and Stamina;Understanding of Exercise Prescription Increase Physical Activity;Increase Strength and Stamina;Understanding of Exercise Prescription Increase Physical Activity;Increase Strength and Stamina;Understanding of Exercise Prescription Increase Physical Activity;Increase Strength and Stamina;Understanding of Exercise Prescription    Comments Bridget Deleon is doing well in rehab. She continues to walk up to 30 laps on the track. She also continues to work at level 1 on the T4 nustep and improved to level 2 on the biostep. She continues to do well with 2 lb hand weights for resistance training as well. We will continue to monitor her progress in the program. Bridget Deleon is doing well in rehab. She continues to work at level 1 on the arm crank and level 2 on the biostep. She did improve to level 2 on the T4 nustep. She also continues to walk up to 30 laps on the track. We will continue to monitor her progress in the program. Bridget Deleon continues to do well in rehab. She continues to walk 30 laps on the track and work at level 2 on the biostep. She has also stayed consistent at level 1 on  the recumbent bike and arm crank. She did increase from 2 lb to 3 lb hand weights for resistance training as well. We will continue to monitor her progress in the program. Bridget Deleon is doing well in rehab. She improved to level 3 on the biostep and kept her other seated workloads consistent. She did have a decline in her laps on the track, going from previously walking 30 laps down to 25 laps. We will continue to monitor her progress in the program. Bridget Deleon is doing well in rehab. She improved to level 4 on the biostep and kept her other seated workloads consistent. She also has continued to do well with walking on the track at 24 laps. She increased from 3 lb to 5 lb hand weights for resistance training as well. We will continue to monitor her progress in the program.   Expected Outcomes Short: Increase T4 nustep to level 2. Long: Continue to improve strength and stamina. Short: Continue to walk 30 laps consistently. Long: Continue to improve strength and stamina. Short: Try level 2 on the recumbent bike. Long: Continue exercise to improve strength and stamina. Short: Increase laps on track back up to previous amount. Long: Continue exercise to improve strength and stamina. Short: Increase laps  on track back up to previous amount. Long: Continue exercise to improve strength and stamina.    Row Name 12/13/22 1358             Exercise Goal Re-Evaluation   Exercise Goals Review Increase Physical Activity;Increase Strength and Stamina;Understanding of Exercise Prescription       Comments Bridget Deleon is doing well in rehab. She recently increased her laps on the track back up to 27 laps. She also improved to level 5 on the T4 nustep and continues to work at level 3 on the biostep. She is also due for her post and will look to improve on it. We will continue to monitor her progress in the program.       Expected Outcomes Short: Improve on post . Long: Continue exercise to improve strength and stamina.                Discharge Exercise Prescription (Final Exercise Prescription Changes):  Exercise Prescription Changes - 12/15/22 1700       Oxygen   Maintain Oxygen Saturation 88% or higher             Nutrition:  Target Goals: Understanding of nutrition guidelines, daily intake of sodium 1500mg , cholesterol 200mg , calories 30% from fat and 7% or less from saturated fats, daily to have 5 or more servings of fruits and vegetables.  Education: All About Nutrition: -Group instruction provided by verbal, written material, interactive activities, discussions, models, and posters to present general guidelines for heart healthy nutrition including fat, fiber, MyPlate, the role of sodium in heart healthy nutrition, utilization of the nutrition label, and utilization of this knowledge for meal planning. Follow up email sent as well. Written material given at graduation. Flowsheet Row Pulmonary Rehab from 09/15/2022 in [facility] Cardiac and Pulmonary Rehab  Education need identified 08/19/22       Biometrics:  Pre Biometrics - 08/19/22 1624       Pre Biometrics   Height 5\' 8"  (1.727 m)    Weight 134 lb (60.8 kg)    Waist Circumference 31 inches    Hip Circumference 38 inches     Waist to Hip Ratio 0.82 %    BMI (Calculated) 20.38    Single  Leg Stand 2.5 seconds             Post Biometrics - 12/15/22 1702        Post  Biometrics   Height 5\' 8"  (1.727 m)    Weight 139 lb 8 oz (63.3 kg)    Waist Circumference 32 inches    Hip Circumference 40.5 inches    Waist to Hip Ratio 0.79 %    BMI (Calculated) 21.22    Single Leg Stand 2.5 seconds             Nutrition Therapy Plan and Nutrition Goals:  Nutrition Therapy & Goals - 08/19/22 1616       Intervention Plan   Intervention Prescribe, educate and counsel regarding individualized specific dietary modifications aiming towards targeted core components such as weight, hypertension, lipid management, diabetes, heart failure and other comorbidities.    Expected Outcomes Short Term Goal: Understand basic principles of dietary content, such as calories, fat, sodium, cholesterol and nutrients.;Short Term Goal: A plan has been developed with personal nutrition goals set during dietitian appointment.;Long Term Goal: Adherence to prescribed nutrition plan.             Nutrition Assessments:  MEDIFICTS Score Key: ?70 Need to make dietary changes  40-70 Heart Healthy Diet ? 40 Therapeutic Level Cholesterol Diet  Flowsheet Row Pulmonary Rehab from 08/19/2022 in [facility] Cardiac and Pulmonary Rehab  Picture Your Plate Total Score on Admission 44      Picture Your Plate Scores: <16 Unhealthy dietary pattern with much room for improvement. 41-50 Dietary pattern unlikely to meet recommendations for good health and room for improvement. 51-60 More healthful dietary pattern, with some room for improvement.  >60 Healthy dietary pattern, although there may be some specific behaviors that could be improved.   Nutrition Goals Re-Evaluation:  Nutrition Goals Re-Evaluation     Row Name 09/15/22 1612 11/08/22 1544           Goals   Current Weight 137 lb (62.1 kg) --      Nutrition Goal Eat smaller portions --       Comment Patient was informed on why it is important to maintain a balanced diet when dealing with Respiratory issues. Explained that it takes a lot of energy to breath and when they are short of breath often they will need to have a good diet to help keep up with the calories they are expending for breathing. Patient defers RD appointment      Expected Outcome Short: Choose and plan snacks accordingly to patients caloric intake to improve breathing. Long: Maintain a diet independently that meets their caloric intake to aid in daily shortness of breath. --               Nutrition Goals Discharge (Final Nutrition Goals Re-Evaluation):  Nutrition Goals Re-Evaluation - 11/08/22 1544       Goals   Comment Patient defers RD appointment             Psychosocial: Target Goals: Acknowledge presence or absence of significant depression and/or stress, maximize coping skills, provide positive support system. Participant is able to verbalize types and ability to use techniques and skills needed for reducing stress and depression.   Education: Stress, Anxiety, and Depression - Group verbal and visual presentation to define topics covered.  Reviews how body is impacted by stress, anxiety, and depression.  Also discusses healthy ways to reduce stress and to treat/manage anxiety and depression.  Written material given  at graduation. Flowsheet Row Pulmonary Rehab from 09/15/2022 in [facility] Cardiac and Pulmonary Rehab  Date 09/08/22  Educator KW  Instruction Review Code 1- [employer] Understanding       Education: Sleep Hygiene -Provides group verbal and written instruction about how sleep can affect your health.  Define sleep hygiene, discuss sleep cycles and impact of sleep habits. Review good sleep hygiene tips.    Initial Review & Psychosocial Screening:  Initial Psych Review & Screening - 08/12/22 1510       Initial Review   Current issues with Current Anxiety/Panic;Current Psychotropic  Meds   zoloft  as needed for anxiety     Family Dynamics   Good Support System? Yes   Marianne Sofia in law and grand daughter  great grandson 72     Barriers   Psychosocial barriers to participate in program There are no identifiable barriers or psychosocial needs.      Screening Interventions   Interventions Encouraged to exercise;To provide support and resources with identified psychosocial needs;Provide feedback about the scores to participant    Expected Outcomes Short Term goal: Utilizing psychosocial counselor, staff and physician to assist with identification of specific Stressors or current issues interfering with healing process. Setting desired goal for each stressor or current issue identified.;Long Term Goal: Stressors or current issues are controlled or eliminated.;Short Term goal: Identification and review with participant of any Quality of Life or Depression concerns found by scoring the questionnaire.;Long Term goal: The participant improves quality of Life and PHQ9 Scores as seen by post scores and/or verbalization of changes             Quality of Life Scores:  Scores of 19 and below usually indicate a poorer quality of life in these areas.  A difference of  2-3 points is a clinically meaningful difference.  A difference of 2-3 points in the total score of the Quality of Life Index has been associated with significant improvement in overall quality of life, self-image, physical symptoms, and general health in studies assessing change in quality of life.  PHQ-9: Review Flowsheet       09/20/2022 09/15/2022 08/19/2022  Depression screen PHQ 2/9  Decreased Interest 1 0 2  Down, Depressed, Hopeless 1 1 1   PHQ - 2 Score 2 1 3   Altered sleeping 0 0 0  Tired, decreased energy 0 0 2  Change in appetite 0 0 3  Feeling bad or failure about yourself  0 0 3  Trouble concentrating 0 0 0  Moving slowly or fidgety/restless 0 0 0  Suicidal thoughts 0 0 1  PHQ-9 Score 2 1 12    Difficult doing work/chores Not difficult at all Not difficult at all Somewhat difficult    Details           Interpretation of Total Score  Total Score Depression Severity:  1-4 = Minimal depression, 5-9 = Mild depression, 10-14 = Moderate depression, 15-19 = Moderately severe depression, 20-27 = Severe depression   Psychosocial Evaluation and Intervention:  Psychosocial Evaluation - 08/12/22 1634       Psychosocial Evaluation & Interventions   Interventions Encouraged to exercise with the program and follow exercise prescription    Comments Bridget Deleon is ready to start the program.  She does not have transportation. She was advised  about and given the [facility] information and phone number.  She lives alone and has support from friends. She would like to see improvement with her shortness of breath. She will  plan on attending 2 days aweek.    Expected Outcomes STG Bridget Deleon will be able to attend all scheduled sessions and work on her exercise progression and improve her breathing with ADLs.  LTG Bridget Deleon will be able to continue to wrok on her exercise progression and improved breathing    Continue Psychosocial Services  Follow up required by staff             Psychosocial Re-Evaluation:  Psychosocial Re-Evaluation     Row Name 09/15/22 1619 11/08/22 1543           Psychosocial Re-Evaluation   Current issues with Current Stress Concerns None Identified      Comments Reviewed patient health questionnaire (PHQ-9) with patient for follow up. Previously, patients score indicated signs/symptoms of depression.  Reviewed to see if patient is improving symptom wise while in program.  Score improved and patient states that it is because she is able to exersise more. Patient reports no issues with their current mental states, sleep, stress, depression or anxiety. Will follow up with patient in a few weeks for any changes.      Expected Outcomes Short: Continue to attend [facility] regularly for regular  exercise and social engagement. Long: Continue to improve symptoms and manage a positive mental state. Short: Continue to exercise regularly to support mental health and notify staff of any changes. Long: maintain mental health and well being through teaching of rehab or prescribed medications independently.      Interventions Encouraged to attend Pulmonary Rehabilitation for the exercise Encouraged to attend Pulmonary Rehabilitation for the exercise      Continue Psychosocial Services  Follow up required by staff Follow up required by staff               Psychosocial Discharge (Final Psychosocial Re-Evaluation):  Psychosocial Re-Evaluation - 11/08/22 1543       Psychosocial Re-Evaluation   Current issues with None Identified    Comments Patient reports no issues with their current mental states, sleep, stress, depression or anxiety. Will follow up with patient in a few weeks for any changes.    Expected Outcomes Short: Continue to exercise regularly to support mental health and notify staff of any changes. Long: maintain mental health and well being through teaching of rehab or prescribed medications independently.    Interventions Encouraged to attend Pulmonary Rehabilitation for the exercise    Continue Psychosocial Services  Follow up required by staff             Education: Education Goals: Education classes will be provided on a weekly basis, covering required topics. Participant will state understanding/return demonstration of topics presented.  Learning Barriers/Preferences:   General Pulmonary Education Topics:  Infection Prevention: - Provides verbal and written material to individual with discussion of infection control including proper hand washing and proper equipment cleaning during exercise session. Flowsheet Row Pulmonary Rehab from 09/15/2022 in [facility] Cardiac and Pulmonary Rehab  Education need identified 08/19/22  Date 08/19/22  Educator KW  Instruction Review  Code 1- Verbalizes Understanding       Falls Prevention: - Provides verbal and written material to individual with discussion of falls prevention and safety. Flowsheet Row Pulmonary Rehab from 08/12/2022 in [facility] Cardiac and Pulmonary Rehab  Date 08/12/22  Educator SB  Instruction Review Code 1- Verbalizes Understanding       Chronic Lung Disease Review: - Group verbal instruction with posters, models, PowerPoint presentations and videos,  to review new updates, new respiratory medications, new  advancements in procedures and treatments. Providing information on websites and "800" numbers for continued self-education. Includes information about supplement oxygen, available portable oxygen systems, continuous and intermittent flow rates, oxygen safety, concentrators, and Medicare reimbursement for oxygen. Explanation of Pulmonary Drugs, including class, frequency, complications, importance of spacers, rinsing mouth after steroid MDI's, and proper cleaning methods for nebulizers. Review of basic lung anatomy and physiology related to function, structure, and complications of lung disease. Review of risk factors. Discussion about methods for diagnosing sleep apnea and types of masks and machines for OSA. Includes a review of the use of types of environmental controls: home humidity, furnaces, filters, dust mite/pet prevention, HEPA vacuums. Discussion about weather changes, air quality and the benefits of nasal washing. Instruction on Warning signs, infection symptoms, calling MD promptly, preventive modes, and value of vaccinations. Review of effective airway clearance, coughing and/or vibration techniques. Emphasizing that all should Create an Action Plan. Written material given at graduation. Flowsheet Row Pulmonary Rehab from 09/15/2022 in [facility] Cardiac and Pulmonary Rehab  Education need identified 08/19/22  Date 09/01/22  Educator [facility]  Instruction Review Code 1- Verbalizes Understanding        AED/CPR: - Group verbal and written instruction with the use of models to demonstrate the basic use of the AED with the basic ABC's of resuscitation.    Anatomy and Cardiac Procedures: - Group verbal and visual presentation and models provide information about basic cardiac anatomy and function. Reviews the testing methods done to diagnose heart disease and the outcomes of the test results. Describes the treatment choices: Medical Management, Angioplasty, or Coronary Bypass Surgery for treating various heart conditions including Myocardial Infarction, Angina, Valve Disease, and Cardiac Arrhythmias.  Written material given at graduation.   Medication Safety: - Group verbal and visual instruction to review commonly prescribed medications for heart and lung disease. Reviews the medication, class of the drug, and side effects. Includes the steps to properly store meds and maintain the prescription regimen.  Written material given at graduation.   Other: -Provides group and verbal instruction on various topics (see comments)   Knowledge Questionnaire Score:  Knowledge Questionnaire Score - 08/19/22 1611       Knowledge Questionnaire Score   Pre Score 15/18              Core Components/Risk Factors/Patient Goals at Admission:  Personal Goals and Risk Factors at Admission - 08/19/22 1636       Core Components/Risk Factors/Patient Goals on Admission    Weight Management Yes;Weight Gain   Per pt request, had lost some weight and would like to regain some back   Intervention Weight Management: Develop a combined nutrition and exercise program designed to reach desired caloric intake, while maintaining appropriate intake of nutrient and fiber, sodium and fats, and appropriate energy expenditure required for the weight goal.;Weight Management: Provide education and appropriate resources to help participant work on and attain dietary goals.    Admit Weight 134 lb (60.8 kg)    Goal  Weight: Short Term 137 lb (62.1 kg)    Goal Weight: Long Term 140 lb (63.5 kg)    Expected Outcomes Short Term: Continue to assess and modify interventions until short term weight is achieved;Long Term: Adherence to nutrition and physical activity/exercise program aimed toward attainment of established weight goal;Weight Gain: Understanding of general recommendations for a high calorie, high protein meal plan that promotes weight gain by distributing calorie intake throughout the day with the consumption for 4-5 meals, snacks, and/or supplements;Understanding  recommendations for meals to include 15-35% energy as protein, 25-35% energy from fat, 35-60% energy from carbohydrates, less than 200mg  of dietary cholesterol, 20-35 gm of total fiber daily;Understanding of distribution of calorie intake throughout the day with the consumption of 4-5 meals/snacks    Improve shortness of breath with ADL's Yes    Intervention Provide education, individualized exercise plan and daily activity instruction to help decrease symptoms of SOB with activities of daily living.    Expected Outcomes Short Term: Improve cardiorespiratory fitness to achieve a reduction of symptoms when performing ADLs;Long Term: Be able to perform more ADLs without symptoms or delay the onset of symptoms    Increase knowledge of respiratory medications and ability to use respiratory devices properly  Yes    Intervention Provide education and demonstration as needed of appropriate use of medications, inhalers, and oxygen therapy.    Expected Outcomes Short Term: Achieves understanding of medications use. Understands that oxygen is a medication prescribed by physician. Demonstrates appropriate use of inhaler and oxygen therapy.;Long Term: Maintain appropriate use of medications, inhalers, and oxygen therapy.    Hypertension Yes    Intervention Provide education on lifestyle modifcations including regular physical activity/exercise, weight management,  moderate sodium restriction and increased consumption of fresh fruit, vegetables, and low fat dairy, alcohol moderation, and smoking cessation.;Monitor prescription use compliance.    Expected Outcomes Short Term: Continued assessment and intervention until BP is < 140/74mm HG in hypertensive participants. < 130/77mm HG in hypertensive participants with diabetes, heart failure or chronic kidney disease.;Long Term: Maintenance of blood pressure at goal levels.             Education:Diabetes - Individual verbal and written instruction to review signs/symptoms of diabetes, desired ranges of glucose level fasting, after meals and with exercise. Acknowledge that pre and post exercise glucose checks will be done for 3 sessions at entry of program.   Know Your Numbers and Heart Failure: - Group verbal and visual instruction to discuss disease risk factors for cardiac and pulmonary disease and treatment options.  Reviews associated critical values for Overweight/Obesity, Hypertension, Cholesterol, and Diabetes.  Discusses basics of heart failure: signs/symptoms and treatments.  Introduces Heart Failure Zone chart for action plan for heart failure.  Written material given at graduation. Flowsheet Row Pulmonary Rehab from 09/15/2022 in [facility] Cardiac and Pulmonary Rehab  Date 08/25/22  Educator MS  Instruction Review Code 1- Verbalizes Understanding       Core Components/Risk Factors/Patient Goals Review:   Goals and Risk Factor Review     Row Name 09/15/22 1611 11/08/22 1545           Core Components/Risk Factors/Patient Goals Review   Personal Goals Review Improve shortness of breath with ADL's Improve shortness of breath with ADL's      Review Spoke to patient about their shortness of breath and what they can do to improve. Patient has been informed of breathing techniques when starting the program. Patient is informed to tell staff if they have had any med changes and that certain meds they  are taking or not taking can be causing shortness of breath. Patient has been doing well in the program and does not need to wear oxygen. Her saturation have been in the 90s and high 90s. She has some shortness of breath here and there but mostly when she is walking.      Expected Outcomes Short: Attend [facility] regularly to improve shortness of breath with ADL's. Long: maintain independence with ADL's Short:Continue  to walk and improve SOB. Long: graduate [facility].               Core Components/Risk Factors/Patient Goals at Discharge (Final Review):   Goals and Risk Factor Review - 11/08/22 1545       Core Components/Risk Factors/Patient Goals Review   Personal Goals Review Improve shortness of breath with ADL's    Review Patient has been doing well in the program and does not need to wear oxygen. Her saturation have been in the 90s and high 90s. She has some shortness of breath here and there but mostly when she is walking.    Expected Outcomes Short:Continue to walk and improve SOB. Long: graduate [facility].             ITP Comments:  ITP Comments     Row Name 08/12/22 1633 08/19/22 1608 08/25/22 1536 09/01/22 0939 09/29/22 1131   ITP Comments Virtual orientation call completed today. shehas an appointment on Date: 5/22024  for EP eval and gym Orientation.  Documentation of diagnosis can be found in C HLDate: 07/07/2022 . Completed and gym orientation. Initial ITP created and sent for review to Dr. Vida Rigger, Medical Director. First full day of exercise!  Patient was oriented to gym and equipment including functions, settings, policies, and procedures.  Patient's individual exercise prescription and treatment plan were reviewed.  All starting workloads were established based on the results of the 6 minute walk test done at initial orientation visit.  The plan for exercise progression was also introduced and progression will be customized based on patient's performance and  goals. 30 Day review completed. Medical Director ITP review done, changes made as directed, and signed approval by Medical Director.    new to program 30 Day review completed. Medical Director ITP review done, changes made as directed, and signed approval by Medical Director.    Row Name 10/26/22 1504 11/24/22 1203 12/22/22 1115       ITP Comments 30 Day review completed. Medical Director ITP review done, changes made as directed, and signed approval by Medical Director. 30 Day review completed. Medical Director ITP review done, changes made as directed, and signed approval by Medical Director. 30 Day review completed. Medical Director ITP review done, changes made as directed, and signed approval by Medical Director.              Comments:

## 2022-12-22 NOTE — Progress Notes (Signed)
Daily Session Note  Patient Details  Name: Bridget Deleon MRN: [id number] Date of Birth: 1940/12/08 Referring Provider:   Flowsheet Row Pulmonary Rehab from 08/19/2022 in [facility] Cardiac and Pulmonary Rehab  Referring Provider Vida Rigger MD       Encounter Date: 12/22/2022  Check In:  Session Check In - 12/22/22 1548       Check-In   Supervising physician immediately available to respond to emergencies See telemetry face sheet for immediately available ER MD    Location [facility]-Cardiac & Pulmonary Rehab    Staff Present Tommye Standard, BS, ACSM CEP, Exercise Physiologist;Joseph Reino Kent, Pincus Sanes, RN BSN    Virtual Visit No    Medication changes reported     No    Fall or balance concerns reported    No    Warm-up and Cool-down Performed on first and last piece of equipment    Resistance Training Performed Yes    VAD Patient? No    PAD/SET Patient? No      Pain Assessment   Currently in Pain? No/denies                Social History   Tobacco Use  Smoking Status Former   Current packs/day: 0.00   Average packs/day: 1 pack/day for 55.0 years (55.0 ttl pk-yrs)   Types: Cigarettes   Start date: 34   Quit date: 2009   Years since quitting: 15.6  Smokeless Tobacco Never    Goals Met:  Independence with exercise equipment Exercise tolerated well No report of concerns or symptoms today Strength training completed today  Goals Unmet:  Not Applicable  Comments: Pt able to follow exercise prescription today without complaint.  Will continue to monitor for progression.    Dr. Bethann Punches is Medical Director for [facility] Cardiac Rehabilitation.  Dr. Vida Rigger is Medical Director for [facility] Pulmonary Rehabilitation.

## 2022-12-23 ENCOUNTER — Encounter: Payer: BC Managed Care – PPO | Admitting: *Deleted

## 2022-12-23 DIAGNOSIS — J449 Chronic obstructive pulmonary disease, unspecified: Secondary | ICD-10-CM

## 2022-12-23 NOTE — Progress Notes (Signed)
Daily Session Note  Patient Details  Name: Bridget Deleon MRN: [id number] Date of Birth: 07-22-1940 Referring Provider:   Flowsheet Row Pulmonary Rehab from 08/19/2022 in [facility] Cardiac and Pulmonary Rehab  Referring Provider Vida Rigger MD       Encounter Date: 12/23/2022  Check In:  Session Check In - 12/23/22 1526       Check-In   Supervising physician immediately available to respond to emergencies See telemetry face sheet for immediately available ER MD    Location [facility]-Cardiac & Pulmonary Rehab    Staff Present Maxon Conetta BS, , Exercise Physiologist;Megan Katrinka Blazing, RN, ADN;Evelia Waskey Jewel Baize, RN BSN    Virtual Visit No    Medication changes reported     No    Fall or balance concerns reported    No    Warm-up and Cool-down Performed on first and last piece of equipment    Resistance Training Performed Yes    VAD Patient? No    PAD/SET Patient? No      Pain Assessment   Currently in Pain? No/denies                Social History   Tobacco Use  Smoking Status Former   Current packs/day: 0.00   Average packs/day: 1 pack/day for 55.0 years (55.0 ttl pk-yrs)   Types: Cigarettes   Start date: 83   Quit date: 2009   Years since quitting: 15.6  Smokeless Tobacco Never    Goals Met:  Independence with exercise equipment Exercise tolerated well No report of concerns or symptoms today Strength training completed today  Goals Unmet:  Not Applicable  Comments: Pt able to follow exercise prescription today without complaint.  Will continue to monitor for progression.    Dr. Bethann Punches is Medical Director for [facility] Cardiac Rehabilitation.  Dr. Vida Rigger is Medical Director for [facility] Pulmonary Rehabilitation.

## 2022-12-27 ENCOUNTER — Encounter: Payer: BC Managed Care – PPO | Admitting: *Deleted

## 2022-12-27 DIAGNOSIS — J449 Chronic obstructive pulmonary disease, unspecified: Secondary | ICD-10-CM

## 2022-12-27 NOTE — Progress Notes (Signed)
Daily Session Note  Patient Details  Name: Bridget Deleon MRN: [id number] Date of Birth: [birth year] Referring Provider:   Flowsheet Row Pulmonary Rehab from 08/19/2022 in [facility] Cardiac and Pulmonary Rehab  Referring Provider Vida Rigger MD       Encounter Date: 12/27/2022  Check In:  Session Check In - 12/27/22 1554       Check-In   Supervising physician immediately available to respond to emergencies See telemetry face sheet for immediately available ER MD    Location [facility]-Cardiac & Pulmonary Rehab    Staff Present Cora Collum, RN, BSN, CCRP;Maxon Conetta BS, , Exercise Physiologist;Joseph [place], [place]    Virtual Visit No    Medication changes reported     No    Fall or balance concerns reported    No    Warm-up and Cool-down Performed on first and last piece of equipment    Resistance Training Performed Yes    VAD Patient? No    PAD/SET Patient? No      Pain Assessment   Currently in Pain? No/denies                Social History   Tobacco Use  Smoking Status Former   Current packs/day: 0.00   Average packs/day: 1 pack/day for 55.0 years (55.0 ttl pk-yrs)   Types: Cigarettes   Start date: 64   Quit date: 2009   Years since quitting: 15.6  Smokeless Tobacco Never    Goals Met:  Proper associated with RPD/PD & O2 Sat Independence with exercise equipment Exercise tolerated well No report of concerns or symptoms today  Goals Unmet:  Not Applicable  Comments: Pt able to follow exercise prescription today without complaint.  Will continue to monitor for progression.    Dr. Bethann Punches is Medical Director for [facility] Cardiac Rehabilitation.  Dr. Vida Rigger is Medical Director for [facility] Pulmonary Rehabilitation.

## 2022-12-29 ENCOUNTER — Encounter: Payer: BC Managed Care – PPO | Admitting: *Deleted

## 2022-12-29 DIAGNOSIS — J449 Chronic obstructive pulmonary disease, unspecified: Secondary | ICD-10-CM | POA: Diagnosis not present

## 2022-12-29 NOTE — Progress Notes (Signed)
Daily Session Note  Patient Details  Name: Ashtynn Schnell MRN: [id number] Date of Birth: [birth year] Referring Provider:   Flowsheet Row Pulmonary Rehab from 08/19/2022 in [facility] Cardiac and Pulmonary Rehab  Referring Provider Vida Rigger MD       Encounter Date: 12/29/2022  Check In:  Session Check In - 12/29/22 1545       Check-In   Supervising physician immediately available to respond to emergencies See telemetry face sheet for immediately available ER MD    Location [facility]-Cardiac & Pulmonary Rehab    Staff Present Maxon Suzzette Righter, , Exercise Physiologist;Maguadalupe Lata Jewel Baize, RN BSN;Joseph Reino Kent, RCP,RRT,BSRT    Virtual Visit No    Medication changes reported     No    Fall or balance concerns reported    No    Warm-up and Cool-down Performed on first and last piece of equipment    Resistance Training Performed Yes    VAD Patient? No    PAD/SET Patient? No      Pain Assessment   Currently in Pain? No/denies                Social History   Tobacco Use  Smoking Status Former   Current packs/day: 0.00   Average packs/day: 1 pack/day for 55.0 years (55.0 ttl pk-yrs)   Types: Cigarettes   Start date: 60   Quit date: 2009   Years since quitting: 15.7  Smokeless Tobacco Never    Goals Met:  Independence with exercise equipment Exercise tolerated well No report of concerns or symptoms today Strength training completed today  Goals Unmet:  Not Applicable  Comments: Pt able to follow exercise prescription today without complaint.  Will continue to monitor for progression.    Dr. Bethann Punches is Medical Director for [facility] Cardiac Rehabilitation.  Dr. Vida Rigger is Medical Director for [facility] Pulmonary Rehabilitation.

## 2023-01-03 ENCOUNTER — Encounter: Payer: BC Managed Care – PPO | Admitting: *Deleted

## 2023-01-03 DIAGNOSIS — J449 Chronic obstructive pulmonary disease, unspecified: Secondary | ICD-10-CM

## 2023-01-03 NOTE — Progress Notes (Signed)
Daily Session Note  Patient Details  Name: Bridget Deleon MRN: [id number] Date of Birth: 24-Jul-1940 Referring Provider:   Flowsheet Row Pulmonary Rehab from 08/19/2022 in [facility] Cardiac and Pulmonary Rehab  Referring Provider Vida Rigger MD       Encounter Date: 01/03/2023  Check In:  Session Check In - 01/03/23 1529       Check-In   Supervising physician immediately available to respond to emergencies See telemetry face sheet for immediately available ER MD    Location [facility]-Cardiac & Pulmonary Rehab    Staff Present Susann Givens, RN BSN;Maxon Conetta BS, , Exercise Physiologist;Joseph [place], [place]    Virtual Visit No    Medication changes reported     No    Fall or balance concerns reported    No    Tobacco Cessation No Change    Warm-up and Cool-down Performed on first and last piece of equipment    Resistance Training Performed Yes    VAD Patient? No    PAD/SET Patient? No      Pain Assessment   Currently in Pain? No/denies                Social History   Tobacco Use  Smoking Status Former   Current packs/day: 0.00   Average packs/day: 1 pack/day for 55.0 years (55.0 ttl pk-yrs)   Types: Cigarettes   Start date: 60   Quit date: 2009   Years since quitting: 15.7  Smokeless Tobacco Never    Goals Met:  Independence with exercise equipment Exercise tolerated well No report of concerns or symptoms today Strength training completed today  Goals Unmet:  Not Applicable  Comments: Pt able to follow exercise prescription today without complaint.  Will continue to monitor for progression.    Dr. Bethann Punches is Medical Director for [facility] Cardiac Rehabilitation.  Dr. Vida Rigger is Medical Director for [facility] Pulmonary Rehabilitation.

## 2023-01-05 ENCOUNTER — Encounter: Payer: BC Managed Care – PPO | Admitting: *Deleted

## 2023-01-05 ENCOUNTER — Other Ambulatory Visit: Payer: Self-pay | Admitting: Internal Medicine

## 2023-01-05 DIAGNOSIS — Z1231 Encounter for screening mammogram for malignant neoplasm of breast: Secondary | ICD-10-CM

## 2023-01-05 DIAGNOSIS — J449 Chronic obstructive pulmonary disease, unspecified: Secondary | ICD-10-CM | POA: Diagnosis not present

## 2023-01-05 NOTE — Progress Notes (Signed)
Pulmonary Individual Treatment Plan  Patient Details  Name: Bridget Deleon MRN: [id number] Date of Birth: [birth year] Referring Provider:   Flowsheet Row Pulmonary Rehab from 08/19/2022 in [facility] Cardiac and Pulmonary Rehab  Referring Provider Vida Rigger MD       Initial Encounter Date:  Flowsheet Row Pulmonary Rehab from 08/19/2022 in [facility] Cardiac and Pulmonary Rehab  Date 08/19/22       Visit Diagnosis: Stage 2 moderate COPD by GOLD classification (HCC)  Patient's Home Medications on Admission:  Current Outpatient Medications:    alendronate (FOSAMAX) 70 MG tablet, Take 70 mg by mouth every 7 (seven) days. On Saturday.  Take with a full glass of water on an empty stomach., Disp: , Rfl:    ALPRAZolam (XANAX) 0.25 MG tablet, Take by mouth., Disp: , Rfl:    benzonatate (TESSALON) 200 MG capsule, Take 200 mg by mouth 3 (three) times daily as needed for cough., Disp: , Rfl:    Calcium Carbonate-Vitamin D (CALCIUM-D) 600-400 MG-UNIT TABS, Take 1 tablet by mouth every morning., Disp: , Rfl:    cholecalciferol (VITAMIN D) 1000 UNITS tablet, Take 1,000 Units by mouth every morning., Disp: , Rfl:    CRANBERRY PO, Take 1,500 mg by mouth every morning., Disp: , Rfl:    dicyclomine (BENTYL) 20 MG tablet, Take 20 mg by mouth 3 (three) times daily as needed for spasms., Disp: , Rfl:    diphenhydramine-acetaminophen (TYLENOL PM) 25-500 MG TABS tablet, Take 1 tablet by mouth at bedtime as needed., Disp: , Rfl:    famotidine (PEPCID) 20 MG tablet, Take by mouth., Disp: , Rfl:    fluticasone (FLONASE) 50 MCG/ACT nasal spray, Place 1 spray into both nostrils daily., Disp: , Rfl:    gentamicin cream (GARAMYCIN) 0.1 %, Apply 1 application topically 3 (three) times daily as needed., Disp: , Rfl:    guaiFENesin-dextromethorphan (ROBITUSSIN DM) 100-10 MG/5ML syrup, Take 5 mLs by mouth every 4 (four) hours as needed for cough., Disp: 118 mL, Rfl: 0   hydrocortisone 2.5 % cream, Apply topically 2 (two)  times daily as needed., Disp: , Rfl:    ibuprofen (ADVIL,MOTRIN) 800 MG tablet, Take 800 mg by mouth 2 (two) times daily as needed. For pain, Disp: , Rfl:    losartan (COZAAR) 100 MG tablet, Take by mouth., Disp: , Rfl:    methocarbamol (ROBAXIN) 500 MG tablet, Take 500 mg by mouth 3 (three) times daily as needed for muscle spasms., Disp: , Rfl:    NIFEdipine (PROCARDIA-XL/NIFEDICAL-XL) 30 MG 24 hr tablet, Take 30 mg by mouth daily., Disp: , Rfl:    ondansetron (ZOFRAN-ODT) 8 MG disintegrating tablet, Take 8 mg by mouth every 8 (eight) hours as needed for nausea or vomiting., Disp: , Rfl:    oxyCODONE-acetaminophen (PERCOCET/ROXICET) 5-325 MG tablet, Take by mouth every 4 (four) hours as needed for severe pain., Disp: , Rfl:    senna-docusate (SENOKOT-S) 8.6-50 MG per tablet, Take 2 tablets by mouth at bedtime as needed. For constipation, Disp: , Rfl:    sertraline (ZOLOFT) 50 MG tablet, Take 1 tablet by mouth daily., Disp: , Rfl:    torsemide (DEMADEX) 20 MG tablet, Take 20 mg by mouth 3 (three) times a week., Disp: , Rfl:    traMADol (ULTRAM) 50 MG tablet, Take by mouth every 6 (six) hours as needed., Disp: , Rfl:    TRELEGY ELLIPTA 100-62.5-25 MCG/ACT AEPB, Inhale 1 puff into the lungs daily., Disp: , Rfl:    triamcinolone cream (KENALOG) 0.1 %,  Apply 1 application topically 2 (two) times daily as needed., Disp: , Rfl:   Past Medical History: Past Medical History:  Diagnosis Date   Arthritis    osteoarthritis   Cancer (HCC) 2010   right lower lobe cancer    Complication of anesthesia 23 yrs ago .    woke up on operating table, also A-fib for short time after Lung surgery   Coronary artery disease 2010   woke up and states she was told  bottom of heart was asleep ... takes diltiazem for that reason .   GERD (gastroesophageal reflux disease)    Ovarian ca (HCC) 1972   Wears dentures    full upper and lower    Tobacco Use: Social History   Tobacco Use  Smoking Status Former    Current packs/day: 0.00   Average packs/day: 1 pack/day for 55.0 years (55.0 ttl pk-yrs)   Types: Cigarettes   Start date: 63   Quit date: 2009   Years since quitting: 15.7  Smokeless Tobacco Never    Labs: Review Flowsheet        No data to display           Pulmonary Assessment Scores:  Pulmonary Assessment Scores     Row Name 08/19/22 1611         ADL UCSD   ADL Phase Entry     SOB Score total 74     Rest 0     Walk 2     Stairs 5     Bath 3     Dress 3     Shop 5       CAT Score   CAT Score 13       mMRC Score   mMRC Score 3              UCSD: Self-administered rating of dyspnea associated with activities of daily living (ADLs) 6-point scale (0 = "not at all" to 5 = "maximal or unable to do because of breathlessness")  Scoring Scores range from 0 to 120.  Minimally important difference is 5 units  CAT: CAT can identify the health impairment of COPD patients and is better correlated with disease progression.  CAT has a scoring range of zero to 40. The CAT score is classified into four groups of low (less than 10), medium (10 - 20), high (21-30) and very high (31-40) based on the impact level of disease on health status. A CAT score over 10 suggests significant symptoms.  A worsening CAT score could be explained by an exacerbation, poor medication adherence, poor inhaler technique, or progression of COPD or comorbid conditions.  CAT MCID is 2 points  mMRC: mMRC (Modified Medical Research Council) Dyspnea Scale is used to assess the degree of baseline functional disability in patients of respiratory disease due to dyspnea. No minimal important difference is established. A decrease in score of 1 point or greater is considered a positive change.   Pulmonary Function Assessment:  Pulmonary Function Assessment - 08/19/22 1618       Pulmonary Function Tests   FEV1% 53 %    FEV1/FVC Ratio 58.9   Taken from PFT 07/20/22 ([facility])     Post Bronchodilator  Spirometry Results   FEV1% 52 %    FEV1/FVC Ratio 55.97             Exercise Target Goals: Exercise Program Goal: Individual exercise prescription set using results from initial 6 min walk test and THRR while  considering  patient's activity barriers and safety.   Exercise Prescription Goal: Initial exercise prescription builds to 30-45 minutes a day of aerobic activity, 2-3 days per week.  Home exercise guidelines will be given to patient during program as part of exercise prescription that the participant will acknowledge.  Education: Aerobic Exercise: - Group verbal and visual presentation on the components of exercise prescription. Introduces F.I.T.T principle from ACSM for exercise prescriptions.  Reviews F.I.T.T. principles of aerobic exercise including progression. Written material given at graduation.   Education: Resistance Exercise: - Group verbal and visual presentation on the components of exercise prescription. Introduces F.I.T.T principle from ACSM for exercise prescriptions  Reviews F.I.T.T. principles of resistance exercise including progression. Written material given at graduation.    Education: Exercise & Equipment Safety: - Individual verbal instruction and demonstration of equipment use and safety with use of the equipment. Flowsheet Row Pulmonary Rehab from 09/15/2022 in [facility] Cardiac and Pulmonary Rehab  Education need identified 08/19/22  Date 08/19/22  Educator KW  Instruction Review Code 1- Verbalizes Understanding       Education: Exercise Physiology & General Exercise Guidelines: - Group verbal and written instruction with models to review the exercise physiology of the cardiovascular system and associated critical values. Provides general exercise guidelines with specific guidelines to those with heart or lung disease.  Flowsheet Row Pulmonary Rehab from 09/15/2022 in [facility] Cardiac and Pulmonary Rehab  Date 09/15/22  Educator [facility]  Instruction Review  Code 1- Verbalizes Understanding       Education: Flexibility, Balance, Mind/Body Relaxation: - Group verbal and visual presentation with interactive activity on the components of exercise prescription. Introduces F.I.T.T principle from ACSM for exercise prescriptions. Reviews F.I.T.T. principles of flexibility and balance exercise training including progression. Also discusses the mind body connection.  Reviews various relaxation techniques to help reduce and manage stress (i.e. Deep breathing, progressive muscle relaxation, and visualization). Balance handout provided to take home. Written material given at graduation.   Activity Barriers & Risk Stratification:  Activity Barriers & Cardiac Risk Stratification - 08/19/22 1624       Activity Barriers & Cardiac Risk Stratification   Activity Barriers Arthritis;Balance Concerns;Deconditioning;Muscular Weakness;Assistive Device;Shortness of Breath             6 Minute Walk:  6 Minute Walk     Row Name 08/19/22 1624 12/15/22 1655       6 Minute Walk   Phase Initial Discharge    Distance 570 feet 850 feet    Distance % Change -- 49 %    Distance Feet Change -- 280 ft    Walk Time 6 minutes 6 minutes    # of Rest Breaks 0 0    MPH 1.07 1.61    METS 1.48 2.05    RPE 15 13    Perceived Dyspnea  3 0    VO2 Peak 5.21 7.2    Symptoms Yes (comment) No    Comments SOB, general fatigue --    Resting HR 72 bpm 90 bpm    Resting BP 122/68 102/50    Resting Oxygen Saturation  96 % 95 %    Exercise Oxygen Saturation  during 6 min walk 94 % 91 %    Max Ex. HR 89 bpm 104 bpm    Max Ex. BP 142/68 138/62    2 Minute Post BP 124/68 122/54      Interval HR   1 Minute HR 85 100    2 Minute HR 88  104    3 Minute HR 87 104    4 Minute HR 89 104    5 Minute HR 88 104    6 Minute HR 87 104    2 Minute Post HR 70 88    Interval Heart Rate? Yes Yes      Interval Oxygen   Interval Oxygen? Yes Yes    Baseline Oxygen Saturation % 96 % 95  %    1 Minute Oxygen Saturation % 94 % 93 %    1 Minute Liters of Oxygen 0 L  RA --    2 Minute Oxygen Saturation % 94 % 91 %    2 Minute Liters of Oxygen 0 L --    3 Minute Oxygen Saturation % 94 % 91 %    3 Minute Liters of Oxygen 0 L --    4 Minute Oxygen Saturation % 97 % 93 %    4 Minute Liters of Oxygen 0 L --    5 Minute Oxygen Saturation % 97 % 93 %    5 Minute Liters of Oxygen 0 L --    6 Minute Oxygen Saturation % 97 % 94 %    6 Minute Liters of Oxygen 0 L --    2 Minute Post Oxygen Saturation % 98 % 97 %    2 Minute Post Liters of Oxygen 0 L --            Oxygen Initial Assessment:  Oxygen Initial Assessment - 08/19/22 1611       Home Oxygen   Home Oxygen Device None    Sleep Oxygen Prescription None    Home Exercise Oxygen Prescription None    Home Resting Oxygen Prescription None      Initial 6 min Walk   Oxygen Used None      Program Oxygen Prescription   Program Oxygen Prescription None      Intervention   Short Term Goals To learn and demonstrate proper pursed lip breathing techniques or other breathing techniques. ;To learn and demonstrate proper use of respiratory medications    Long  Term Goals Compliance with respiratory medication;Demonstrates proper use of MDI's;Exhibits proper breathing techniques, such as pursed lip breathing or other method taught during program session             Oxygen Re-Evaluation:  Oxygen Re-Evaluation     Row Name 08/25/22 1538 09/15/22 1609 11/08/22 1542         Program Oxygen Prescription   Program Oxygen Prescription -- None None       Home Oxygen   Home Oxygen Device -- None None     Sleep Oxygen Prescription -- None None     Home Exercise Oxygen Prescription -- None None     Home Resting Oxygen Prescription -- None None       Goals/Expected Outcomes   Short Term Goals -- To learn and demonstrate proper use of respiratory medications;To learn and understand importance of maintaining oxygen  saturations>88% To learn and demonstrate proper pursed lip breathing techniques or other breathing techniques.      Long  Term Goals -- Maintenance of O2 saturations>88%;Demonstrates proper use of MDI's;Verbalizes importance of monitoring SPO2 with pulse oximeter and return demonstration Exhibits proper breathing techniques, such as pursed lip breathing or other method taught during program session     Comments Reviewed PLB technique with pt.  Talked about how it works and it's importance in maintaining their exercise saturations. She does  not have a pulse oximeter to check her/his oxygen saturation at home. Informed her where to get one and explained why it is important to have one. Reviewed that oxygen saturations should be 88 percent and above. Diaphragmatic and PLB breathing explained and performed with patient. Patient has a better understanding of how to do these exercises to help with breathing performance and relaxation. Patient performed breathing techniques adequately and to practice further at home.     Goals/Expected Outcomes Short: Become more profiecient at using PLB.   Long: Become independent at using PLB. Short: monitor oxygen at home with exertion. Long: maintain oxygen saturations above 88 percent independently. Short: practice PLB and diaphragmatic breathing at home. Long: Use PLB and diaphragmatic breathing independently post [employer].              Oxygen Discharge (Final Oxygen Re-Evaluation):  Oxygen Re-Evaluation - 11/08/22 1542       Program Oxygen Prescription   Program Oxygen Prescription None      Home Oxygen   Home Oxygen Device None    Sleep Oxygen Prescription None    Home Exercise Oxygen Prescription None    Home Resting Oxygen Prescription None      Goals/Expected Outcomes   Short Term Goals To learn and demonstrate proper pursed lip breathing techniques or other breathing techniques.     Long  Term Goals Exhibits proper breathing techniques, such as pursed  lip breathing or other method taught during program session    Comments Diaphragmatic and PLB breathing explained and performed with patient. Patient has a better understanding of how to do these exercises to help with breathing performance and relaxation. Patient performed breathing techniques adequately and to practice further at home.    Goals/Expected Outcomes Short: practice PLB and diaphragmatic breathing at home. Long: Use PLB and diaphragmatic breathing independently post [employer].             Initial Exercise Prescription:  Initial Exercise Prescription - 08/19/22 1600       Date of Initial Exercise RX and Referring Provider   Date 08/19/22    Referring Provider Vida Rigger MD      Oxygen   Maintain Oxygen Saturation 88% or higher      NuStep   Level 1    SPM 80    Minutes 15    METs 1.4      Biostep-RELP   Level 1    SPM 50    Minutes 15    METs 1.4      Track   Laps 7    Minutes 15    METs 1.44      Prescription Details   Frequency (times per week) 2    Duration Progress to 30 minutes of continuous aerobic without signs/symptoms of physical distress      Intensity   THRR 40-80% of Max Heartrate 99 - 126    Ratings of Perceived Exertion 11-13    Perceived Dyspnea 0-4      Progression   Progression Continue to progress workloads to maintain intensity without signs/symptoms of physical distress.      Resistance Training   Training Prescription Yes    Weight 2 lb    Reps 10-15             Perform Capillary Blood Glucose checks as needed.  Exercise Prescription Changes:   Exercise Prescription Changes     Row Name 08/19/22 1600 08/26/22 0900 09/06/22 1300 09/21/22 0700 10/04/22 1400  Response to Exercise   Blood Pressure (Admit) 122/68 130/70 110/60 118/58 120/60   Blood Pressure (Exercise) 142/68 130/60 138/68 110/54 142/68   Blood Pressure (Exit) 124/68 110/62 118/58 110/60 114/60   Heart Rate (Admit) 72 bpm 67 bpm 73 bpm 70 bpm  70 bpm   Heart Rate (Exercise) 89 bpm 94 bpm 85 bpm 94 bpm 94 bpm   Heart Rate (Exit) 70 bpm 80 bpm 67 bpm 70 bpm 70 bpm   Oxygen Saturation (Admit) 96 % 97 % 98 % 96 % 93 %   Oxygen Saturation (Exercise) 94 % 95 % 88 % 96 % 92 %   Oxygen Saturation (Exit) 97 % 97 % 98 % 97 % 96 %   Rating of Perceived Exertion (Exercise) 15 15 13 13 14    Perceived Dyspnea (Exercise) 3 0 3 2 2    Symptoms SOB, fatigue none SOB SOB SOB   Comments walk test results 1st full day of exercise -- -- --   Duration -- Progress to 30 minutes of  aerobic without signs/symptoms of physical distress Progress to 30 minutes of  aerobic without signs/symptoms of physical distress Progress to 30 minutes of  aerobic without signs/symptoms of physical distress Progress to 30 minutes of  aerobic without signs/symptoms of physical distress   Intensity -- THRR unchanged THRR unchanged THRR unchanged THRR unchanged     Progression   Progression -- Continue to progress workloads to maintain intensity without signs/symptoms of physical distress. Continue to progress workloads to maintain intensity without signs/symptoms of physical distress. Continue to progress workloads to maintain intensity without signs/symptoms of physical distress. Continue to progress workloads to maintain intensity without signs/symptoms of physical distress.   Average METs -- 2.07 2.09 2.31 2.12     Resistance Training   Training Prescription -- Yes Yes Yes Yes   Weight -- 2 lb 2 lb 2 lb 2 lb   Reps -- 10-15 10-15 10-15 10-15     Interval Training   Interval Training -- No No No No     Recumbant Bike   Level -- -- 1 -- --   Watts -- -- 11 -- --   Minutes -- -- 15 -- --     NuStep   Level -- -- 1 1 1    Minutes -- -- 15 15 15    METs -- -- -- -- 1.6     Biostep-RELP   Level -- 1 -- 1 2   Minutes -- 15 -- 15 15   METs -- 2 -- 2 2     Track   Laps -- 21 24 30 30    Minutes -- 15 15 15 15    METs -- 2.14 2.31 2.63 2.63     Oxygen   Maintain  Oxygen Saturation -- 88% or higher 88% or higher 88% or higher 88% or higher    Row Name 10/22/22 0800 11/04/22 0700 11/18/22 0700 11/30/22 1500 12/13/22 1300     Response to Exercise   Blood Pressure (Admit) 118/62 110/60 98/52 120/54 130/56   Blood Pressure (Exit) 114/58 100/60 120/60 100/42 107/60   Heart Rate (Admit) 71 bpm 80 bpm 78 bpm 74 bpm 78 bpm   Heart Rate (Exercise) 96 bpm 97 bpm 103 bpm 98 bpm 97 bpm   Heart Rate (Exit) 91 bpm 80 bpm 89 bpm 83 bpm 80 bpm   Oxygen Saturation (Admit) 96 % 95 % 93 % 96 % 94 %   Oxygen Saturation (Exercise) 92 % 91 %  91 % 93 % 92 %   Oxygen Saturation (Exit) 96 % 95 % 95 % 95 % 97 %   Rating of Perceived Exertion (Exercise) 15 13 13 14 14    Perceived Dyspnea (Exercise) 3 2 3 2 2    Symptoms SOB SOB SOB SOB SOB   Duration Progress to 30 minutes of  aerobic without signs/symptoms of physical distress Progress to 30 minutes of  aerobic without signs/symptoms of physical distress Progress to 30 minutes of  aerobic without signs/symptoms of physical distress Progress to 30 minutes of  aerobic without signs/symptoms of physical distress Progress to 30 minutes of  aerobic without signs/symptoms of physical distress   Intensity THRR unchanged THRR unchanged THRR unchanged THRR unchanged THRR unchanged     Progression   Progression Continue to progress workloads to maintain intensity without signs/symptoms of physical distress. Continue to progress workloads to maintain intensity without signs/symptoms of physical distress. Continue to progress workloads to maintain intensity without signs/symptoms of physical distress. Continue to progress workloads to maintain intensity without signs/symptoms of physical distress. Continue to progress workloads to maintain intensity without signs/symptoms of physical distress.   Average METs 2.17 2.33 1.89 2.33 2.5     Resistance Training   Training Prescription Yes Yes Yes Yes Yes   Weight 2 lb 3 lb 3 lb 5 lb 5 lb   Reps  10-15 10-15 10-15 10-15 10-15     Interval Training   Interval Training No No No No No     Recumbant Bike   Level -- 1 -- 1 --   Watts -- 15 -- 25 --   Minutes -- 15 -- 15 --   METs -- -- -- 3.25 --     NuStep   Level 2 -- 2 -- 5   Minutes 15 -- 15 -- 15   METs 2 -- 1.4 -- 2.2     Arm Ergometer   Level 1 1 1  -- --   Minutes 15 15 15  -- --   METs 1.6 2 1  -- --     T5 Nustep   Level -- -- 1 -- --   Minutes -- -- 15 -- --   METs -- -- 1.8 -- --     Biostep-RELP   Level 2 2 3 4 4    Minutes 15 15 15 15 15    METs 2 2 2 2 3      Track   Laps 30 30 25 24 27    Minutes 15 15 15 15 15    METs 2.63 2.63 2.36 2.31 2.47     Oxygen   Maintain Oxygen Saturation 88% or higher 88% or higher 88% or higher 88% or higher 88% or higher    Row Name 12/15/22 1700 12/29/22 1100           Response to Exercise   Blood Pressure (Admit) -- 126/52      Blood Pressure (Exit) -- 112/56      Heart Rate (Admit) -- 84 bpm      Heart Rate (Exercise) -- 98 bpm      Heart Rate (Exit) -- 80 bpm      Oxygen Saturation (Admit) -- 96 %      Oxygen Saturation (Exercise) -- 86 %      Oxygen Saturation (Exit) -- 97 %      Rating of Perceived Exertion (Exercise) -- 13      Perceived Dyspnea (Exercise) -- 3      Symptoms --  SOB      Duration -- Progress to 30 minutes of  aerobic without signs/symptoms of physical distress      Intensity -- THRR unchanged        Progression   Progression -- Continue to progress workloads to maintain intensity without signs/symptoms of physical distress.      Average METs -- 2.4        Resistance Training   Training Prescription -- Yes      Weight -- 5 lb      Reps -- 10-15        Interval Training   Interval Training -- No        NuStep   Level -- 3      Minutes -- 15      METs -- 2        Track   Laps -- 25      Minutes -- 15      METs -- 2.36        Oxygen   Maintain Oxygen Saturation 88% or higher 88% or higher               Exercise  Comments:   Exercise Comments     Row Name 08/25/22 1536           Exercise Comments First full day of exercise!  Patient was oriented to gym and equipment including functions, settings, policies, and procedures.  Patient's individual exercise prescription and treatment plan were reviewed.  All starting workloads were established based on the results of the 6 minute walk test done at initial orientation visit.  The plan for exercise progression was also introduced and progression will be customized based on patient's performance and goals.                Exercise Goals and Review:   Exercise Goals     Row Name 08/19/22 1636             Exercise Goals   Increase Physical Activity Yes       Intervention Provide advice, education, support and counseling about physical activity/exercise needs.;Develop an individualized exercise prescription for aerobic and resistive training based on initial evaluation findings, risk stratification, comorbidities and participant's personal goals.       Expected Outcomes Short Term: Attend rehab on a regular basis to increase amount of physical activity.;Long Term: Add in home exercise to make exercise part of routine and to increase amount of physical activity.;Long Term: Exercising regularly at least 3-5 days a week.       Increase Strength and Stamina Yes       Intervention Provide advice, education, support and counseling about physical activity/exercise needs.;Develop an individualized exercise prescription for aerobic and resistive training based on initial evaluation findings, risk stratification, comorbidities and participant's personal goals.       Expected Outcomes Short Term: Perform resistance training exercises routinely during rehab and add in resistance training at home;Short Term: Increase workloads from initial exercise prescription for resistance, speed, and METs.;Long Term: Improve cardiorespiratory fitness, muscular endurance and  strength as measured by increased METs and functional capacity ( )       Able to understand and use rate of perceived exertion (RPE) scale Yes       Intervention Provide education and explanation on how to use RPE scale       Expected Outcomes Short Term: Able to use RPE daily in rehab to express subjective intensity level;Long Term:  Able to use  RPE to guide intensity level when exercising independently       Able to understand and use Dyspnea scale Yes       Intervention Provide education and explanation on how to use Dyspnea scale       Expected Outcomes Short Term: Able to use Dyspnea scale daily in rehab to express subjective sense of shortness of breath during exertion;Long Term: Able to use Dyspnea scale to guide intensity level when exercising independently       Knowledge and understanding of Target Heart Rate Range (THRR) Yes       Intervention Provide education and explanation of THRR including how the numbers were predicted and where they are located for reference       Expected Outcomes Short Term: Able to state/look up THRR;Long Term: Able to use THRR to govern intensity when exercising independently;Short Term: Able to use daily as guideline for intensity in rehab       Able to check pulse independently Yes       Intervention Provide education and demonstration on how to check pulse in carotid and radial arteries.;Review the importance of being able to check your own pulse for safety during independent exercise       Expected Outcomes Long Term: Able to check pulse independently and accurately;Short Term: Able to explain why pulse checking is important during independent exercise       Understanding of Exercise Prescription Yes       Intervention Provide education, explanation, and written materials on patient's individual exercise prescription       Expected Outcomes Short Term: Able to explain program exercise prescription;Long Term: Able to explain home exercise prescription to  exercise independently                Exercise Goals Re-Evaluation :  Exercise Goals Re-Evaluation     Row Name 08/25/22 1538 08/26/22 0932 09/06/22 1401 09/15/22 1622 09/21/22 0719     Exercise Goal Re-Evaluation   Exercise Goals Review Increase Physical Activity;Able to understand and use rate of perceived exertion (RPE) scale;Knowledge and understanding of Target Heart Rate Range (THRR);Understanding of Exercise Prescription;Increase Strength and Stamina;Able to understand and use Dyspnea scale;Able to check pulse independently Increase Physical Activity;Increase Strength and Stamina;Understanding of Exercise Prescription Increase Physical Activity;Increase Strength and Stamina;Understanding of Exercise Prescription Increase Physical Activity;Increase Strength and Stamina;Understanding of Exercise Prescription Increase Physical Activity;Increase Strength and Stamina;Understanding of Exercise Prescription   Comments Reviewed RPE scale, THR and program prescription with pt today.  Pt voiced understanding and was given a copy of goals to take home. Ann did well for her first session here at rehab. She was able to complete 21 laps on the track! Her RPEs are appropriate and O2 saturations stayed above 88%. We will continue to monitor as she progresses in the program. Dewayne Hatch is doing well in rehab. She recently increased her number of laps on the track up to 24 laps. She also has continued to work at level 1 on the T4 nustep and recumbent bike. She has done well with 2 lb hand weights for resistance training as well. We will continue to monitor her progress in the program. Dewayne Hatch is up to 27 laps on the track. She would like to do the XR next time she is in class. She states that the biostep is very hard for her. Dewayne Hatch is doing well in rehab.  She is up to 30 laps.  Her saturations are still doing well.  We will encourage  her to try to increased seated equipment to level 2.  We will continue to monitor her  progress.   Expected Outcomes Short: Use RPE daily to regulate intensity.  Long: Follow program prescription in THR. Short: Continue initial exercise prescription Long: Build up overall strength and stamina Short: Begin to progressively increase workloads. Long: Continue to improve strength and stamina. Short: work on the XR. Long: maintain exercise post [facility]. Short: Increase seated workloads Long: Conitnue to improve stamina.    Row Name 10/04/22 1421 10/22/22 0818 11/04/22 0742 11/18/22 0753 11/30/22 1504     Exercise Goal Re-Evaluation   Exercise Goals Review Increase Physical Activity;Increase Strength and Stamina;Understanding of Exercise Prescription Increase Physical Activity;Increase Strength and Stamina;Understanding of Exercise Prescription Increase Physical Activity;Increase Strength and Stamina;Understanding of Exercise Prescription Increase Physical Activity;Increase Strength and Stamina;Understanding of Exercise Prescription Increase Physical Activity;Increase Strength and Stamina;Understanding of Exercise Prescription   Comments Dewayne Hatch is doing well in rehab. She continues to walk up to 30 laps on the track. She also continues to work at level 1 on the T4 nustep and improved to level 2 on the biostep. She continues to do well with 2 lb hand weights for resistance training as well. We will continue to monitor her progress in the program. Dewayne Hatch is doing well in rehab. She continues to work at level 1 on the arm crank and level 2 on the biostep. She did improve to level 2 on the T4 nustep. She also continues to walk up to 30 laps on the track. We will continue to monitor her progress in the program. Dewayne Hatch continues to do well in rehab. She continues to walk 30 laps on the track and work at level 2 on the biostep. She has also stayed consistent at level 1 on the recumbent bike and arm crank. She did increase from 2 lb to 3 lb hand weights for resistance training as well. We will continue to monitor  her progress in the program. Dewayne Hatch is doing well in rehab. She improved to level 3 on the biostep and kept her other seated workloads consistent. She did have a decline in her laps on the track, going from previously walking 30 laps down to 25 laps. We will continue to monitor her progress in the program. Dewayne Hatch is doing well in rehab. She improved to level 4 on the biostep and kept her other seated workloads consistent. She also has continued to do well with walking on the track at 24 laps. She increased from 3 lb to 5 lb hand weights for resistance training as well. We will continue to monitor her progress in the program.   Expected Outcomes Short: Increase T4 nustep to level 2. Long: Continue to improve strength and stamina. Short: Continue to walk 30 laps consistently. Long: Continue to improve strength and stamina. Short: Try level 2 on the recumbent bike. Long: Continue exercise to improve strength and stamina. Short: Increase laps on track back up to previous amount. Long: Continue exercise to improve strength and stamina. Short: Increase laps on track back up to previous amount. Long: Continue exercise to improve strength and stamina.    Row Name 12/13/22 1358 12/29/22 1116           Exercise Goal Re-Evaluation   Exercise Goals Review Increase Physical Activity;Increase Strength and Stamina;Understanding of Exercise Prescription Increase Physical Activity;Increase Strength and Stamina;Understanding of Exercise Prescription      Comments Dewayne Hatch is doing well in rehab. She recently increased her laps on the  track back up to 27 laps. She also improved to level 5 on the T4 nustep and continues to work at level 3 on the biostep. She is also due for her post and will look to improve on it. We will continue to monitor her progress in the program. Dewayne Hatch continues to do well in rehab. She recently completed her post-6MWT and improved by 49%. She has been able to maintain her intensity on both the track and the  T4 nustep. We will continue to monitor her progress in the program.      Expected Outcomes Short: Improve on post . Long: Continue exercise to improve strength and stamina. Short: Graduate. Long: Continue to exercise independently.               Discharge Exercise Prescription (Final Exercise Prescription Changes):  Exercise Prescription Changes - 12/29/22 1100       Response to Exercise   Blood Pressure (Admit) 126/52    Blood Pressure (Exit) 112/56    Heart Rate (Admit) 84 bpm    Heart Rate (Exercise) 98 bpm    Heart Rate (Exit) 80 bpm    Oxygen Saturation (Admit) 96 %    Oxygen Saturation (Exercise) 86 %    Oxygen Saturation (Exit) 97 %    Rating of Perceived Exertion (Exercise) 13    Perceived Dyspnea (Exercise) 3    Symptoms SOB    Duration Progress to 30 minutes of  aerobic without signs/symptoms of physical distress    Intensity THRR unchanged      Progression   Progression Continue to progress workloads to maintain intensity without signs/symptoms of physical distress.    Average METs 2.4      Resistance Training   Training Prescription Yes    Weight 5 lb    Reps 10-15      Interval Training   Interval Training No      NuStep   Level 3    Minutes 15    METs 2      Track   Laps 25    Minutes 15    METs 2.36      Oxygen   Maintain Oxygen Saturation 88% or higher             Nutrition:  Target Goals: Understanding of nutrition guidelines, daily intake of sodium 1500mg , cholesterol 200mg , calories 30% from fat and 7% or less from saturated fats, daily to have 5 or more servings of fruits and vegetables.  Education: All About Nutrition: -Group instruction provided by verbal, written material, interactive activities, discussions, models, and posters to present general guidelines for heart healthy nutrition including fat, fiber, MyPlate, the role of sodium in heart healthy nutrition, utilization of the nutrition label, and utilization of this  knowledge for meal planning. Follow up email sent as well. Written material given at graduation. Flowsheet Row Pulmonary Rehab from 09/15/2022 in [facility] Pulmonary Rehab  Education need identified 08/19/22       Biometrics:  Pre Biometrics - 08/19/22 1624       Pre Biometrics   Height 5\' 8"  (1.727 m)    Weight 134 lb (60.8 kg)    Waist Circumference 31 inches    Hip Circumference 38 inches    Waist to Hip Ratio 0.82 %    BMI (Calculated) 20.38    Single Leg Stand 2.5 seconds             Post Biometrics - 12/15/22 1702  Post  Biometrics   Height 5\' 8"  (1.727 m)    Weight 139 lb 8 oz (63.3 kg)    Waist Circumference 32 inches    Hip Circumference 40.5 inches    Waist to Hip Ratio 0.79 %    BMI (Calculated) 21.22    Single Leg Stand 2.5 seconds             Nutrition Therapy Plan and Nutrition Goals:  Nutrition Therapy & Goals - 08/19/22 1616       Intervention Plan   Intervention Prescribe, educate and counsel regarding individualized specific dietary modifications aiming towards targeted core components such as weight, hypertension, lipid management, diabetes, heart failure and other comorbidities.    Expected Outcomes Short Term Goal: Understand basic principles of dietary content, such as calories, fat, sodium, cholesterol and nutrients.;Short Term Goal: A plan has been developed with personal nutrition goals set during dietitian appointment.;Long Term Goal: Adherence to prescribed nutrition plan.             Nutrition Assessments:  MEDIFICTS Score Key: >=70 Need to make dietary changes  40-70 Heart Healthy Diet <= 40 Therapeutic Level Cholesterol Diet  Flowsheet Row Pulmonary Rehab from 08/19/2022 in [facility] Pulmonary Rehab  Picture Your Plate Total Score on Admission 44      Picture Your Plate Scores: <81 Unhealthy dietary pattern with much room for improvement. 41-50 Dietary pattern unlikely to meet recommendations for  good health and room for improvement. 51-60 More healthful dietary pattern, with some room for improvement.  >60 Healthy dietary pattern, although there may be some specific behaviors that could be improved.   Nutrition Goals Re-Evaluation:  Nutrition Goals Re-Evaluation     Row Name 09/15/22 1612 11/08/22 1544           Goals   Current Weight 137 lb (62.1 kg) --      Nutrition Goal Eat smaller portions --      Comment Patient was informed on why it is important to maintain a balanced diet when dealing with Respiratory issues. Explained that it takes a lot of energy to breath and when they are short of breath often they will need to have a good diet to help keep up with the calories they are expending for breathing. Patient defers RD appointment      Expected Outcome Short: Choose and plan snacks accordingly to patients caloric intake to improve breathing. Long: Maintain a diet independently that meets their caloric intake to aid in daily shortness of breath. --               Nutrition Goals Discharge (Final Nutrition Goals Re-Evaluation):  Nutrition Goals Re-Evaluation - 11/08/22 1544       Goals   Comment Patient defers RD appointment             Psychosocial: Target Goals: Acknowledge presence or absence of significant depression and/or stress, maximize coping skills, provide positive support system. Participant is able to verbalize types and ability to use techniques and skills needed for reducing stress and depression.   Education: Stress, Anxiety, and Depression - Group verbal and visual presentation to define topics covered.  Reviews how body is impacted by stress, anxiety, and depression.  Also discusses healthy ways to reduce stress and to treat/manage anxiety and depression.  Written material given at graduation. Flowsheet Row Pulmonary Rehab from 09/15/2022 in [facility] Card[facility] and Pulmonary Rehab  Date 09/08/22  Educator KW  Instruction Review Code 1- Verbalizes  Understanding  Education: Sleep Hygiene -Provides group verbal and written instruction about how sleep can affect your health.  Define sleep hygiene, discuss sleep cycles and impact of sleep habits. Review good sleep hygiene tips.    Initial Review & Psychosocial Screening:  Initial Psych Review & Screening - 08/12/22 1510       Initial Review   Current issues with Current Anxiety/Panic;Current Psychotropic Meds   zoloft  as needed for anxiety     Family Dynamics   Good Support System? Yes   Marianne Sofia in law and grand daughter  great grandson 23     Barriers   Psychosocial barriers to participate in program There are no identifiable barriers or psychosocial needs.      Screening Interventions   Interventions Encouraged to exercise;To provide support and resources with identified psychosocial needs;Provide feedback about the scores to participant    Expected Outcomes Short Term goal: Utilizing psychosocial counselor, staff and physician to assist with identification of specific Stressors or current issues interfering with healing process. Setting desired goal for each stressor or current issue identified.;Long Term Goal: Stressors or current issues are controlled or eliminated.;Short Term goal: Identification and review with participant of any Quality of Life or Depression concerns found by scoring the questionnaire.;Long Term goal: The participant improves quality of Life and PHQ9 Scores as seen by post scores and/or verbalization of changes             Quality of Life Scores:  Scores of 19 and below usually indicate a poorer quality of life in these areas.  A difference of  2-3 points is a clinically meaningful difference.  A difference of 2-3 points in the total score of the Quality of Life Index has been associated with significant improvement in overall quality of life, self-image, physical symptoms, and general health in studies assessing change in quality of  life.  PHQ-9: Review Flowsheet       09/20/2022 09/15/2022 08/19/2022  Depression screen PHQ 2/9  Decreased Interest 1 0 2  Down, Depressed, Hopeless 1 1 1   PHQ - 2 Score 2 1 3   Altered sleeping 0 0 0  Tired, decreased energy 0 0 2  Change in appetite 0 0 3  Feeling bad or failure about yourself  0 0 3  Trouble concentrating 0 0 0  Moving slowly or fidgety/restless 0 0 0  Suicidal thoughts 0 0 1  PHQ-9 Score 2 1 12   Difficult doing work/chores Not difficult at all Not difficult at all Somewhat difficult    Details           Interpretation of Total Score  Total Score Depression Severity:  1-4 = Minimal depression, 5-9 = Mild depression, 10-14 = Moderate depression, 15-19 = Moderately severe depression, 20-27 = Severe depression   Psychosocial Evaluation and Intervention:  Psychosocial Evaluation - 08/12/22 1634       Psychosocial Evaluation & Interventions   Interventions Encouraged to exercise with the program and follow exercise prescription    Comments Dewayne Hatch is ready to start the program.  She does not have transportation. She was advised  about and given the [facility] information and phone number.  She lives alone and has support from friends. She would like to see improvement with her shortness of breath. She will plan on attending 2 days aweek.    Expected Outcomes STG Dewayne Hatch will be able to attend all scheduled sessions and work on her exercise progression and improve her breathing with ADLs.  LTG Dewayne Hatch will be able to continue to wrok on her exercise progression and improved breathing    Continue Psychosocial Services  Follow up required by staff             Psychosocial Re-Evaluation:  Psychosocial Re-Evaluation     Row Name 09/15/22 1619 11/08/22 1543           Psychosocial Re-Evaluation   Current issues with Current Stress Concerns None Identified      Comments Reviewed patient health questionnaire (PHQ-9) with patient for follow up. Previously, patients score  indicated signs/symptoms of depression.  Reviewed to see if patient is improving symptom wise while in program.  Score improved and patient states that it is because she is able to exersise more. Patient reports no issues with their current mental states, sleep, stress, depression or anxiety. Will follow up with patient in a few weeks for any changes.      Expected Outcomes Short: Continue to attend [facility] regularly for regular exercise and social engagement. Long: Continue to improve symptoms and manage a positive mental state. Short: Continue to exercise regularly to support mental health and notify staff of any changes. Long: maintain mental health and well being through teaching of rehab or prescribed medications independently.      Interventions Encouraged to attend Pulmonary Rehabilitation for the exercise Encouraged to attend Pulmonary Rehabilitation for the exercise      Continue Psychosocial Services  Follow up required by staff Follow up required by staff               Psychosocial Discharge (Final Psychosocial Re-Evaluation):  Psychosocial Re-Evaluation - 11/08/22 1543       Psychosocial Re-Evaluation   Current issues with None Identified    Comments Patient reports no issues with their current mental states, sleep, stress, depression or anxiety. Will follow up with patient in a few weeks for any changes.    Expected Outcomes Short: Continue to exercise regularly to support mental health and notify staff of any changes. Long: maintain mental health and well being through teaching of rehab or prescribed medications independently.    Interventions Encouraged to attend Pulmonary Rehabilitation for the exercise    Continue Psychosocial Services  Follow up required by staff             Education: Education Goals: Education classes will be provided on a weekly basis, covering required topics. Participant will state understanding/return demonstration of topics  presented.  Learning Barriers/Preferences:   General Pulmonary Education Topics:  Infection Prevention: - Provides verbal and written material to individual with discussion of infection control including proper hand washing and proper equipment cleaning during exercise session. Flowsheet Row Pulmonary Rehab from 09/15/2022 in [facility] and Pulmonary Rehab  Education need identified 08/19/22  Date 08/19/22  Educator KW  Instruction Review Code 1- Verbalizes Understanding       Falls Prevention: - Provides verbal and written material to individual with discussion of falls prevention and safety. Flowsheet Row Pulmonary Rehab from 08/12/2022 in [facility] Card[facility] and Pulmonary Rehab  Date 08/12/22  Educator SB  Instruction Review Code 1- Verbalizes Understanding       Chronic Lung Disease Review: - Group verbal instruction with posters, models, PowerPoint presentations and videos,  to review new updates, new respiratory medications, new advancements in procedures and treatments. Providing information on websites and "800" numbers for continued self-education. Includes information about supplement oxygen, available portable oxygen systems, continuous and intermittent flow rates, oxygen safety, concentrators, and Medicare  reimbursement for oxygen. Explanation of Pulmonary Drugs, including class, frequency, complications, importance of spacers, rinsing mouth after steroid MDI's, and proper cleaning methods for nebulizers. Review of basic lung anatomy and physiology related to function, structure, and complications of lung disease. Review of risk factors. Discussion about methods for diagnosing sleep apnea and types of masks and machines for OSA. Includes a review of the use of types of environmental controls: home humidity, furnaces, filters, dust mite/pet prevention, HEPA vacuums. Discussion about weather changes, air quality and the benefits of nasal washing. Instruction on Warning signs,  infection symptoms, calling MD promptly, preventive modes, and value of vaccinations. Review of effective airway clearance, coughing and/or vibration techniques. Emphasizing that all should Create an Action Plan. Written material given at graduation. Flowsheet Row Pulmonary Rehab from 09/15/2022 in [facility] Cardiac and Pulmonary Rehab  Education need identified 08/19/22  Date 09/01/22  Educator [facility]  Instruction Review Code 1- Verbalizes Understanding       AED/CPR: - Group verbal and written instruction with the use of models to demonstrate the basic use of the AED with the basic ABC's of resuscitation.    Anatomy and Cardiac Procedures: - Group verbal and visual presentation and models provide information about basic cardiac anatomy and function. Reviews the testing methods done to diagnose heart disease and the outcomes of the test results. Describes the treatment choices: Medical Management, Angioplasty, or Coronary Bypass Surgery for treating various heart conditions including Myocardial Infarction, Angina, Valve Disease, and Cardiac Arrhythmias.  Written material given at graduation.   Medication Safety: - Group verbal and visual instruction to review commonly prescribed medications for heart and lung disease. Reviews the medication, class of the drug, and side effects. Includes the steps to properly store meds and maintain the prescription regimen.  Written material given at graduation.   Other: -Provides group and verbal instruction on various topics (see comments)   Knowledge Questionnaire Score:  Knowledge Questionnaire Score - 08/19/22 1611       Knowledge Questionnaire Score   Pre Score 15/18              Core Components/Risk Factors/Patient Goals at Admission:  Personal Goals and Risk Factors at Admission - 08/19/22 1636       Core Components/Risk Factors/Patient Goals on Admission    Weight Management Yes;Weight Gain   Per pt request, had lost some weight and would  like to regain some back   Intervention Weight Management: Develop a combined nutrition and exercise program designed to reach desired caloric intake, while maintaining appropriate intake of nutrient and fiber, sodium and fats, and appropriate energy expenditure required for the weight goal.;Weight Management: Provide education and appropriate resources to help participant work on and attain dietary goals.    Admit Weight 134 lb (60.8 kg)    Goal Weight: Short Term 137 lb (62.1 kg)    Goal Weight: Long Term 140 lb (63.5 kg)    Expected Outcomes Short Term: Continue to assess and modify interventions until short term weight is achieved;Long Term: Adherence to nutrition and physical activity/exercise program aimed toward attainment of established weight goal;Weight Gain: Understanding of general recommendations for a high calorie, high protein meal plan that promotes weight gain by distributing calorie intake throughout the day with the consumption for 4-5 meals, snacks, and/or supplements;Understanding recommendations for meals to include 15-35% energy as protein, 25-35% energy from fat, 35-60% energy from carbohydrates, less than 200mg  of dietary cholesterol, 20-35 gm of total fiber daily;Understanding of distribution of calorie intake  throughout the day with the consumption of 4-5 meals/snacks    Improve shortness of breath with ADL's Yes    Intervention Provide education, individualized exercise plan and daily activity instruction to help decrease symptoms of SOB with activities of daily living.    Expected Outcomes Short Term: Improve cardiorespiratory fitness to achieve a reduction of symptoms when performing ADLs;Long Term: Be able to perform more ADLs without symptoms or delay the onset of symptoms    Increase knowledge of respiratory medications and ability to use respiratory devices properly  Yes    Intervention Provide education and demonstration as needed of appropriate use of medications,  inhalers, and oxygen therapy.    Expected Outcomes Short Term: Achieves understanding of medications use. Understands that oxygen is a medication prescribed by physician. Demonstrates appropriate use of inhaler and oxygen therapy.;Long Term: Maintain appropriate use of medications, inhalers, and oxygen therapy.    Hypertension Yes    Intervention Provide education on lifestyle modifcations including regular physical activity/exercise, weight management, moderate sodium restriction and increased consumption of fresh fruit, vegetables, and low fat dairy, alcohol moderation, and smoking cessation.;Monitor prescription use compliance.    Expected Outcomes Short Term: Continued assessment and intervention until BP is < 140/71mm HG in hypertensive participants. < 130/42mm HG in hypertensive participants with diabetes, heart failure or chronic kidney disease.;Long Term: Maintenance of blood pressure at goal levels.             Education:Diabetes - Individual verbal and written instruction to review signs/symptoms of diabetes, desired ranges of glucose level fasting, after meals and with exercise. Acknowledge that pre and post exercise glucose checks will be done for 3 sessions at entry of program.   Know Your Numbers and Heart Failure: - Group verbal and visual instruction to discuss disease risk factors for cardiac and pulmonary disease and treatment options.  Reviews associated critical values for Overweight/Obesity, Hypertension, Cholesterol, and Diabetes.  Discusses basics of heart failure: signs/symptoms and treatments.  Introduces Heart Failure Zone chart for action plan for heart failure.  Written material given at graduation. Flowsheet Row Pulmonary Rehab from 09/15/2022 in [facility] Cardiac [facility] Pulmonary Rehab  Date 08/25/22  Educator MS  Instruction Review Code 1- Verbalizes Understanding       Core Components/Risk Factors/Patient Goals Review:   Goals and Risk Factor Review     Row Name  09/15/22 1611 11/08/22 1545           Core Components/Risk Factors/Patient Goals Review   Personal Goals Review Improve shortness of breath with ADL's Improve shortness of breath with ADL's      Review Spoke to patient about their shortness of breath and what they can do to improve. Patient has been informed of breathing techniques when starting the program. Patient is informed to tell staff if they have had any med changes and that certain meds they are taking or not taking can be causing shortness of breath. Patient has been doing well in the program and does not need to wear oxygen. Her saturation have been in the 90s and high 90s. She has some shortness of breath here and there but mostly when she is walking.      Expected Outcomes Short: Attend [facility] regularly to improve shortness of breath with ADL's. Long: maintain independence with ADL's Short:Continue to walk and improve SOB. Long: graduate [facility].               Core Components/Risk Factors/Patient Goals at Discharge (Final Review):   Goals and  Risk Factor Review - 11/08/22 1545       Core Components/Risk Factors/Patient Goals Review   Personal Goals Review Improve shortness of breath with ADL's    Review Patient has been doing well in the program and does not need to wear oxygen. Her saturation have been in the 90s and high 90s. She has some shortness of breath here and there but mostly when she is walking.    Expected Outcomes Short:Continue to walk and improve SOB. Long: graduate [facility].             ITP Comments:  ITP Comments     Row Name 08/12/22 1633 08/19/22 1608 08/25/22 1536 09/01/22 0939 09/29/22 1131   ITP Comments Virtual orientation call completed today. shehas an appointment on Date: 5/22024  for EP eval and gym Orientation.  Documentation of diagnosis can be found in C HLDate: 07/07/2022 . Completed and gym orientation. Initial ITP created and sent for review to Dr. Vida Rigger, Medical  Director. First full day of exercise!  Patient was oriented to gym and equipment including functions, settings, policies, and procedures.  Patient's individual exercise prescription and treatment plan were reviewed.  All starting workloads were established based on the results of the 6 minute walk test done at initial orientation visit.  The plan for exercise progression was also introduced and progression will be customized based on patient's performance and goals. 30 Day review completed. Medical Director ITP review done, changes made as directed, and signed approval by Medical Director.    new to program 30 Day review completed. Medical Director ITP review done, changes made as directed, and signed approval by Medical Director.    Row Name 10/26/22 1504 11/24/22 1203 12/22/22 1115 01/05/23 1541     ITP Comments 30 Day review completed. Medical Director ITP review done, changes made as directed, and signed approval by Medical Director. 30 Day review completed. Medical Director ITP review done, changes made as directed, and signed approval by Medical Director. 30 Day review completed. Medical Director ITP review done, changes made as directed, and signed approval by Medical Director. Maryana graduated today from  rehab with 36 sessions completed.  Details of the patient's exercise prescription and what She needs to do in order to continue the prescription and progress were discussed with patient.  Patient was given a copy of prescription and goals.  Patient verbalized understanding. Cait plans to continue to exercise by walking.             Comments: Discharge ITP

## 2023-01-05 NOTE — Progress Notes (Signed)
Daily Session Note  Patient Details  Name: Bridget Deleon MRN: [id number] Date of Birth: [birth year] Referring Provider:   Flowsheet Row Pulmonary Rehab from 08/19/2022 in [facility] Cardiac and Pulmonary Rehab  Referring Provider Bridget Rigger MD       Encounter Date: 01/05/2023  Check In:  Session Check In - 01/05/23 1541       Check-In   Supervising physician immediately available to respond to emergencies See telemetry face sheet for immediately available ER MD    Location [facility]-Cardiac & Pulmonary Rehab    Staff Present Maxon Suzzette Righter, , Exercise Physiologist;Emaly Boschert Jewel Baize, RN BSN;Joseph Reino [place], RCP,RRT,BSRT    Virtual Visit No    Medication changes reported     No    Fall or balance concerns reported    No    Warm-up and Cool-down Performed on first and last piece of equipment    Resistance Training Performed Yes    VAD Patient? No    PAD/SET Patient? No      Pain Assessment   Currently in Pain? No/denies                Social History   Tobacco Use  Smoking Status Former   Current packs/day: 0.00   Average packs/day: 1 pack/day for 55.0 years (55.0 ttl pk-yrs)   Types: Cigarettes   Start date: 31   Quit date: 2009   Years since quitting: 15.7  Smokeless Tobacco Never    Goals Met:  Independence with exercise equipment Exercise tolerated well No report of concerns or symptoms today Strength training completed today  Goals Unmet:  Not Applicable  Comments:  Bridget Deleon graduated today from  rehab with 36 sessions completed.  Details of the patient's exercise prescription and what She needs to do in order to continue the prescription and progress were discussed with patient.  Patient was given a copy of prescription and goals.  Patient verbalized understanding. Bridget Deleon plans to continue to exercise by walking.    Dr. Bethann Punches is Medical Director for [facility] Cardiac Rehabilitation.  Dr. Vida Deleon is Medical Director for [facility] Pulmonary  Rehabilitation.

## 2023-01-05 NOTE — Progress Notes (Signed)
Discharge Summary  Bridget Deleon DOB: [birth year]    Bridget Deleon graduated today from  rehab with 36 sessions completed.  Details of the patient's exercise prescription and what She needs to do in order to continue the prescription and progress were discussed with patient.  Patient was given a copy of prescription and goals.  Patient verbalized understanding. Bridget Deleon plans to continue to exercise by walking.   6 Minute Walk     Row Name 08/19/22 1624 12/15/22 1655       6 Minute Walk   Phase Initial Discharge    Distance 570 feet 850 feet    Distance % Change -- 49 %    Distance Feet Change -- 280 ft    Walk Time 6 minutes 6 minutes    # of Rest Breaks 0 0    MPH 1.07 1.61    METS 1.48 2.05    RPE 15 13    Perceived Dyspnea  3 0    VO2 Peak 5.21 7.2    Symptoms Yes (comment) No    Comments SOB, general fatigue --    Resting HR 72 bpm 90 bpm    Resting BP 122/68 102/50    Resting Oxygen Saturation  96 % 95 %    Exercise Oxygen Saturation  during 6 min walk 94 % 91 %    Max Ex. HR 89 bpm 104 bpm    Max Ex. BP 142/68 138/62    2 Minute Post BP 124/68 122/54      Interval HR   1 Minute HR 85 100    2 Minute HR 88 104    3 Minute HR 87 104    4 Minute HR 89 104    5 Minute HR 88 104    6 Minute HR 87 104    2 Minute Post HR 70 88    Interval Heart Rate? Yes Yes      Interval Oxygen   Interval Oxygen? Yes Yes    Baseline Oxygen Saturation % 96 % 95 %    1 Minute Oxygen Saturation % 94 % 93 %    1 Minute Liters of Oxygen 0 L  RA --    2 Minute Oxygen Saturation % 94 % 91 %    2 Minute Liters of Oxygen 0 L --    3 Minute Oxygen Saturation % 94 % 91 %    3 Minute Liters of Oxygen 0 L --    4 Minute Oxygen Saturation % 97 % 93 %    4 Minute Liters of Oxygen 0 L --    5 Minute Oxygen Saturation % 97 % 93 %    5 Minute Liters of Oxygen 0 L --    6 Minute Oxygen Saturation % 97 % 94 %    6 Minute Liters of Oxygen 0 L --    2 Minute Post Oxygen Saturation % 98 % 97 %    2  Minute Post Liters of Oxygen 0 L --            Reviewed home exercise with pt today.  Pt plans to walk and do leg lift exercises at home for exercise.  Reviewed THR, pulse, RPE, sign and symptoms, pulse oximetery and when to call 911 or MD.  Also discussed weather considerations and indoor options.  Pt voiced understanding.

## 2023-02-28 ENCOUNTER — Ambulatory Visit
Admission: RE | Admit: 2023-02-28 | Discharge: 2023-02-28 | Disposition: A | Discharge status: Home / Self Care | Payer: Medicare PPO | Source: Ambulatory Visit | Attending: Internal Medicine | Admitting: Internal Medicine

## 2023-02-28 DIAGNOSIS — Z1231 Encounter for screening mammogram for malignant neoplasm of breast: Secondary | ICD-10-CM | POA: Diagnosis present

## 2023-05-29 DIAGNOSIS — M1611 Unilateral primary osteoarthritis, right hip: Principal | ICD-10-CM | POA: Insufficient documentation

## 2023-06-26 NOTE — Discharge Instructions (Addendum)
 Instructions after Total Hip Replacement   James P. Angie Fava., M.D.    Dept. of Orthopaedics & Sports Medicine [facility] [address] [place], [place]  [address]  Phone: [phone]   Fax: [phone]        [email or web address]        DIET: Drink plenty of non-alcoholic fluids. Resume your normal diet. Include foods high in fiber.  ACTIVITY:  You may use crutches or a walker with weight-bearing as tolerated, unless instructed otherwise. You may be weaned off of the walker or crutches by your Physical Therapist.  Do NOT reach below the level of your knees or cross your legs until allowed.    Continue doing gentle exercises. Exercising will reduce the pain and swelling, increase motion, and prevent muscle weakness.   Please continue to use the TED compression stockings for 6 weeks. You may remove the stockings at night, but should reapply them in the morning. Do not drive or operate any equipment until instructed.  WOUND CARE:  Continue to use ice packs periodically to reduce pain and swelling. The initial dressing (Aquacel) can remain in place for 7 days (see separate instructions). Keep the incision clean and dry. You may bathe or shower after the staples are removed at the first office visit following surgery.  MEDICATIONS: You may resume your regular medications. Please take the pain medication as prescribed on the medication. Do not take pain medication on an empty stomach. Unless instructed otherwise, you should take an enteric-coated aspirin 81 mg. TWICE a day. (This along with elevation will help reduce the possibility of blood clots/phlebitis in your operated leg.) Use a stool softener (such as Senokot-S or Colace) daily and a laxative (such as Miralax or Dulcolax) as needed to prevent constipation.  Do not drive or drink alcoholic beverages when taking pain medications.  CALL THE OFFICE FOR: Temperature above 101 degrees Excessive bleeding or drainage  on the dressing. Excessive swelling, coldness, or paleness of the toes. Persistent nausea and vomiting.  FOLLOW-UP:  You should have an appointment to return to the office in 6 weeks after surgery. Arrangements have been made for continuation of Physical Therapy (either home therapy or outpatient therapy).     [facility] Department Directory         [email or web address]       [email or web address]          Cardiology  Appointments: [place] - [phone]  Endocrinology  Appointments: [place] [phone] [place] - [phone]  Gastroenterology  Appointments: [place] [phone] [place] - [phone]        General Surgery   Appointments: [facility]  Internal Medicine/Family Medicine  Appointments: [facility] [place] - [phone] [place] - [phone]  Metabolic and Weigh Loss Surgery  Appointments: [facility]        Neurology  Appointments: [place] [phone] [place] - [phone]  Neurosurgery  Appointments: [place]  Obstetrics & Gynecology  Appointments: [place] [phone] [place] - [phone]        Pediatrics  Appointments: [place] [phone] [place] - [phone]  Physiatry  Appointments: [place] [phone]  Physical Therapy  Appointments: [place] - [phone]        Podiatry  Appointments: [place] [phone] [place] - [phone]  Pulmonology  Appointments: [place]  Rheumatology  Appointments: [place] [phone]        [place] Location: [facility]  [address] [place], [place]  [address]  [place] Sport  Location: [facility]. [address] [place], [place]  [address]  [place] Location: [facility] [address] [place], [place]  [address]     [employer]  [address] , [place],  [place], [address]. [phone] They will call you to arrange when they can come to see you

## 2023-06-26 NOTE — H&P (Signed)
 ORTHOPAEDIC HISTORY & PHYSICAL Bridget Deleon, [place] - 06/23/2023 2:45 PM EST Formatting of this note is different from the original. NAME: Bridget Deleon H&P Date: 06/23/2023 Procedure Date: 07/06/2023  Chief Complaint: right hip pain  HPI Bridget Deleon is a 83 y.o. female who has severe Right hip pain. She states that she has had almost 56-month increase in her right hip discomfort and anterior groin pain that was exacerbated by a fall a few weeks prior. She states that the pain localizes to the lateral and front part of her groin. It is made worse with any attempted weightbearing or ambulation. This hip pain limits the ability of the patient to perform her ADLs and ambulate long distances, and why she presents today in a wheelchair. She has failed conservative treatment including activity modification, intra-articular corticosteroid injections, Tylenol and narcotic analgesia. She has requested operative intervention for relief of her DJD symptoms. She denies any known cardiac history. Patient does have a history of lung cancer for which she had a right lobectomy performed. She denies any history of clots or DVTs. No prior surgeries on her right hip. Patient is not a diabetic.  Social Hx: Patient states that she lives at home with her son-in-law who works often. She does state that she would be ideally looking for rehab placement after surgery as she does not have anyone looking after her at home. She diet denies any alcohol or illicit drug use. She has stopped smoking within the past few years, but does report a 60-year history of smoking.  Medications & Allergies Allergies: Allergies Allergen Reactions Adhesive Tape-Silicones Rash Tears skin Calamine Other (See Comments) Burns skin Codeine Nausea And Vomiting Hydrocodone-Acetaminophen Nausea And Vomiting Wide awake, just doesn't work Iron Rash Penicillins Rash  Home Medicines: Current Outpatient Medications on File Prior to  Visit Medication Sig Dispense Refill alendronate (FOSAMAX) 70 MG tablet Take 1 tablet (70 mg total) by mouth every 7 (seven) days Take with a full glass of water. Do not lie down for the next 30 min. 12 tablet 3 cholecalciferol (VITAMIN D3) 1,000 unit tablet Take 1,000 Units by mouth once daily clotrimazole-betamethasone (LOTRISONE) 1-0.05 % cream APPLY TOPICALLY TWICE A DAY 45 g 2 CRANBERRY EXTRACT ORAL Take 1,500 mg by mouth once daily. famotidine (PEPCID) 20 MG tablet Take 20 mg by mouth nightly as needed for Heartburn. fluticasone propionate (FLONASE) 50 mcg/actuation nasal spray Place 2 sprays into both nostrils once daily 48 g 3 fluticasone-umeclidinium-vilanterol (TRELEGY ELLIPTA) 100-62.5-25 mcg inhaler INHALE 1 PUFF INTO THE LUNGS ONCE DAILY 60 each 5 ipratropium (ATROVENT) 21 mcg (0.03 %) nasal spray Place 1 spray into both nostrils as needed for Rhinitis losartan (COZAAR) 50 MG tablet Take 1 tablet (50 mg total) by mouth once daily 90 tablet 3 NIFEdipine (PROCARDIA-XL) 30 MG (OSM) XL tablet TAKE 1 TABLET BY MOUTH EVERY DAY 90 tablet 1 oxyCODONE-acetaminophen (PERCOCET) 5-325 mg tablet One half tablet po twice a day prn 20 tablet 0 sennosides-docusate (SENOKOT-S) 8.6-50 mg tablet Take 1 tablet by mouth every morning sertraline (ZOLOFT) 50 MG tablet TAKE 1 TABLET (50 MG TOTAL) BY MOUTH ONCE DAILY FOR 180 DAYS 90 tablet 1 TORsemide (DEMADEX) 20 MG tablet TAKE 1 TABLET (20 MG TOTAL) BY MOUTH EVERY MONDAY, WEDNESDAY AND FRIDAY 12 tablet 6  No current facility-administered medications on file prior to visit.  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  General/Constitutional: No apparent distress: well-nourished and well developed. Eyes: Pupils equal, round with synchronous movement. Lymphatic: No palpable adenopathy. Respiratory: Patient has good chest rise and fall with inspiration and expiration. All lung fields are clear to auscultation bilaterally. There is no Rales, rhonchi or wheezes appreciated. Cardiovascular: Upon auscultation there is a regular rate and rhythm without any murmurs, rubs, gallops or heaves appreciated. There does not appear to be any swelling down the lower extremities. Posterior tibial pulses appreciated bilaterally, 2+. Integumentary: No impressive skin lesions present, except as noted in detailed exam. Neuro/Psych: Normal mood and affect, oriented to person, place and time. Musculoskeletal: see exam below  Right hip exam Right Hip:  Pelvic tilt: Negative Limb lengths: Relative equal, but hard to judge as patient is not standing Soft tissue swelling: Negative Erythema: Negative Crepitance: Negative Tenderness: Greater trochanter is nontender to palpation. Moderate pain is elicited by axial compression or extremes of rotation. Atrophy: Mild atrophy. Fair to poor hip flexor and abductor strength. Noted fair flexor strength, poor abductor and abductor strength. Range of Motion: EXT/FLEX: 0/100 with fair strength ADD/ABD: 20/20 with poor to fair strength IR/ER: 10/20 with moderate discomfort  Patient is neurovascularly intact to all dermatomes extending down there Right lower extremity to all dermatomes. Posterior tibial pulses were appreciated, 2+.  Imaging Hip Imaging: None ordered today. Her previous images were reviewed from 04/04/2023. There is noticeable loss of joint space along all  articular margins of the right hip with underlying subchondral changes and osteophyte formation appreciated along the inferior and superior margins of the acetabular rim as well as off of the neck of the right femoral head. No fractures, lytic lesions or gross fomites appreciated on films.  Assesment and Plan Knee DJD  I have recommended that Bridget Deleon undergo right total hip replacement. Consents has been signed. The risks, benefits, prognosis and alternatives including but not limited to DVT, PE, infection, neurovascular injury, failure of the procedure and death were explained to the patient and she is willing to proceed with surgery as described to her by myself. Plan will be for post operative admission of at least 1 midnight for pain control and PT. She will be managed with DVT prophylaxis, antibiotics preoperatively for 24 hours and aggressive in patient rehab.  Pre, intra and post op interventions were discussed. Patient has good understanding  Medication Reconciliation was performed. Discussed cessation of vitamins and supplements.  A total of 45 minutes was spent reviewing patient's charts, medical reconciliation, discussing/educating the patient about surgical interventions, and answering any questions provided by the patient.  JOSHUA Kendrick Fries, PA [facility] 06/23/2023  Electronically signed by Bridget Rover, PA at 06/24/2023 5:47 PM EST

## 2023-06-28 ENCOUNTER — Other Ambulatory Visit: Payer: Self-pay

## 2023-06-28 ENCOUNTER — Encounter: Payer: Self-pay | Admitting: Orthopedic Surgery

## 2023-06-28 ENCOUNTER — Encounter
Admission: RE | Admit: 2023-06-28 | Discharge: 2023-06-28 | Disposition: A | Discharge status: Home / Self Care | Source: Ambulatory Visit | Attending: Orthopedic Surgery | Admitting: Orthopedic Surgery

## 2023-06-28 VITALS — BP 133/58 | HR 63 | Temp 98.2°F | Resp 20 | Ht 69.0 in | Wt 132.6 lb

## 2023-06-28 DIAGNOSIS — Z880 Allergy status to penicillin: Secondary | ICD-10-CM | POA: Insufficient documentation

## 2023-06-28 DIAGNOSIS — Z01812 Encounter for preprocedural laboratory examination: Secondary | ICD-10-CM

## 2023-06-28 DIAGNOSIS — I4510 Unspecified right bundle-branch block: Secondary | ICD-10-CM | POA: Diagnosis not present

## 2023-06-28 DIAGNOSIS — I491 Atrial premature depolarization: Secondary | ICD-10-CM | POA: Diagnosis not present

## 2023-06-28 DIAGNOSIS — M1611 Unilateral primary osteoarthritis, right hip: Secondary | ICD-10-CM | POA: Diagnosis not present

## 2023-06-28 DIAGNOSIS — Z01818 Encounter for other preprocedural examination: Secondary | ICD-10-CM | POA: Diagnosis present

## 2023-06-28 DIAGNOSIS — R0602 Shortness of breath: Secondary | ICD-10-CM | POA: Diagnosis not present

## 2023-06-28 HISTORY — DX: Chronic obstructive pulmonary disease, unspecified: J44.9

## 2023-06-28 LAB — CBC
HCT: 37.1 % (ref 36.0–46.0)
Hemoglobin: 12 g/dL (ref 12.0–15.0)
MCH: 31.1 pg (ref 26.0–34.0)
MCHC: 32.3 g/dL (ref 30.0–36.0)
MCV: 96.1 fL (ref 80.0–100.0)
Platelets: 222 10*3/uL (ref 150–400)
RBC: 3.86 MIL/uL — ABNORMAL LOW (ref 3.87–5.11)
RDW: 14.5 % (ref 11.5–15.5)
WBC: 8.8 10*3/uL (ref 4.0–10.5)
nRBC: 0 % (ref 0.0–0.2)

## 2023-06-28 LAB — COMPREHENSIVE METABOLIC PANEL
ALT: 11 U/L (ref 0–44)
AST: 15 U/L (ref 15–41)
Albumin: 4.1 g/dL (ref 3.5–5.0)
Alkaline Phosphatase: 41 U/L (ref 38–126)
Anion gap: 9 (ref 5–15)
BUN: 17 mg/dL (ref 8–23)
CO2: 25 mmol/L (ref 22–32)
Calcium: 9.3 mg/dL (ref 8.9–10.3)
Chloride: 102 mmol/L (ref 98–111)
Creatinine, Ser: 0.59 mg/dL (ref 0.44–1.00)
GFR, Estimated: >60 mL/min (ref >60)
Glucose, Bld: 104 mg/dL — ABNORMAL HIGH (ref 70–99)
Potassium: 3.8 mmol/L (ref 3.5–5.1)
Sodium: 136 mmol/L (ref 135–145)
Total Bilirubin: 0.9 mg/dL (ref 0.0–1.2)
Total Protein: 6.8 g/dL (ref 6.5–8.1)

## 2023-06-28 LAB — SURGICAL PCR SCREEN
MRSA, PCR: NEGATIVE
Staphylococcus aureus: NEGATIVE

## 2023-06-28 LAB — TYPE AND SCREEN
ABO/RH(D): A POS
Antibody Screen: NEGATIVE

## 2023-06-28 LAB — SEDIMENTATION RATE: Sed Rate: 17 mm/h (ref 0–30)

## 2023-06-28 LAB — C-REACTIVE PROTEIN: CRP: 0.5 mg/dL (ref <1.0)

## 2023-06-28 NOTE — Progress Notes (Signed)
 Patient is unable to urinate for UA today in Pre-admit. Therefore she will do it at home and send it back to this office.

## 2023-06-28 NOTE — Patient Instructions (Addendum)
 Your procedure is scheduled on:  NUUVOZDGU,YQIHK 19/2025  Report to the Registration Desk on the 1st floor of the [facility]. To find out your arrival time, please call [phone] between 1PM - 3PM on: Tuesday, 07/05/2023  If your arrival time is 6:00 am, do not arrive before that time as the [facility] entrance doors do not open until 6:00 am.  REMEMBER: Instructions that are not followed completely may result in serious medical risk, up to and including death; or upon the discretion of your surgeon and anesthesiologist your surgery may need to be rescheduled.  Do not eat food after midnight the night before surgery.  No gum chewing or hard candies.  You may however, drink CLEAR liquids up to 2 hours before you are scheduled to arrive for your surgery. Do not drink anything within 2 hours of your scheduled arrival time.  Clear liquids include: - water  - apple juice without pulp - gatorade (not RED colors) - black coffee or tea (Do NOT add milk or creamers to the coffee or tea) Do NOT drink anything that is not on this list.    In addition, your doctor has ordered for you to drink the provided:  Ensure Pre-Surgery Clear Carbohydrate Drink   Drinking this carbohydrate drink up to two hours before surgery helps to reduce insulin resistance and improve patient outcomes. Please complete drinking 2 hours before scheduled arrival time.  One week prior to surgery: Stop Anti-inflammatories (NSAIDS) such as Advil, Aleve, Ibuprofen, Motrin, Naproxen, Naprosyn and Aspirin based products such as Excedrin, Goody's Powder, BC Powder. Stop ANY OVER THE COUNTER supplements until after surgery(all) ascorbic acid (VITAMIN C), (CALCIUM-D),(VITAMIN D), CRANBERRY PO, (B-12),  L-Lysine ,    You may however, continue to take Tylenol if needed for pain up until the day of surgery.   Continue taking all of your other prescription medications up until the day of surgery.  ON THE DAY OF SURGERY ONLY  TAKE THESE MEDICATIONS WITH SIPS OF WATER:  ALPRAZolam (XANAX) famotidine (PEPCID)  NIFEdipine (PROCARDIA-XL/NIFEDICAL-XL) sertraline (ZOLOFT)  traMADol (ULTRAM)- as needed   Use inhalers ( TRELEGY ELLIPTA ) on the day of surgery.   No Alcohol for 24 hours before or after surgery.  No Smoking including e-cigarettes for 24 hours before surgery.  No chewable tobacco products for at least 6 hours before surgery.  No nicotine patches on the day of surgery.  Do not use any "recreational" drugs for at least a week (preferably 2 weeks) before your surgery.  Please be advised that the combination of cocaine and anesthesia may have negative outcomes, up to and including death. If you test positive for cocaine, your surgery will be cancelled.  On the morning of surgery brush your teeth with toothpaste and water, you may rinse your mouth with mouthwash if you wish. Do not swallow any toothpaste or mouthwash.  Use CHG Soap or wipes as directed on instruction sheet.-provided for you  Do not wear jewelry, make-up, hairpins, clips or nail polish.  For welded (permanent) jewelry: bracelets, anklets, waist bands, etc.  Please have this removed prior to surgery.  If it is not removed, there is a chance that hospital personnel will need to cut it off on the day of surgery.  Do not wear lotions, powders, or perfumes.   Do not shave body hair from the neck down 48 hours before surgery.  Contact lenses, hearing aids and dentures may not be worn into surgery.  Do not bring valuables  to the hospital. [facility] is not responsible for any missing/lost belongings or valuables.    Notify your doctor if there is any change in your medical condition (cold, fever, infection).  Wear comfortable clothing (specific to your surgery type) to the hospital.  After surgery, you can help prevent lung complications by doing breathing exercises.  Take deep breaths and cough every 1-2 hours. Your doctor may  order a device called an Incentive Spirometer to help you take deep breaths.  If you are being admitted to the hospital overnight, leave your suitcase in the car. After surgery it may be brought to your room.  In case of increased patient census, it may be necessary for you, the patient, to continue your postoperative care in the Same Day Surgery department.  If you are being discharged the day of surgery, you will not be allowed to drive home. You will need a responsible individual to drive you home and stay with you for 24 hours after surgery.     Please call the Pre-admissions Testing Dept. at [phone] if you have any questions about these instructions.  Surgery Visitation Policy:  Patients having surgery or a procedure may have two visitors.  Children under the age of 74 must have an adult with them who is not the patient.  Temporary Visitor Restrictions Due to increasing cases of flu, RSV and COVID-19: Children ages 26 and under will not be able to visit patients in [facility] hospitals under most circumstances.  Inpatient Visitation:    Visiting hours are 7 a.m. to 8 p.m. Up to four visitors are allowed at one time in a patient room. The visitors may rotate out with other people during the day.  One visitor age 73 or older may stay with the patient overnight and must be in the room by 8 p.m.         Pre-operative 5 CHG Bath Instructions   You can play a key role in reducing the risk of infection after surgery. Your skin needs to be as free of germs as possible. You can reduce the number of germs on your skin by washing with CHG (chlorhexidine gluconate) soap before surgery. CHG is an antiseptic soap that kills germs and continues to kill germs even after washing.   DO NOT use if you have an allergy to chlorhexidine/CHG or antibacterial soaps. If your skin becomes reddened or irritated, stop using the CHG and notify one of our RNs at [phone].   Please shower  with the CHG soap starting 4 days before surgery using the following schedule:     Please keep in mind the following:  DO NOT shave, including legs and underarms, starting the day of your first shower.   You may shave your face at any point before/day of surgery.  Place clean sheets on your bed the day you start using CHG soap. Use a clean washcloth (not used since being washed) for each shower. DO NOT sleep with pets once you start using the CHG.   CHG Shower Instructions:  If you choose to wash your hair and private area, wash first with your normal shampoo/soap.  After you use shampoo/soap, rinse your hair and body thoroughly to remove shampoo/soap residue.  Turn the water OFF and apply about 3 tablespoons (45 ml) of CHG soap to a CLEAN washcloth.  Apply CHG soap ONLY FROM YOUR NECK DOWN TO YOUR TOES (washing for 3-5 minutes)  DO NOT use CHG soap on face, private  areas, open wounds, or sores.  Pay special attention to the area where your surgery is being performed.  If you are having back surgery, having someone wash your back for you may be helpful. Wait 2 minutes after CHG soap is applied, then you may rinse off the CHG soap.  Pat dry with a clean towel  Put on clean clothes/pajamas   If you choose to wear lotion, please use ONLY the CHG-compatible lotions on the back of this paper.     Additional instructions for the day of surgery: DO NOT APPLY any lotions, deodorants, cologne, or perfumes.   Put on clean/comfortable clothes.  Brush your teeth.  Ask your nurse before applying any prescription medications to the skin.      CHG Compatible Lotions   Aveeno Moisturizing lotion  Cetaphil Moisturizing Cream  Cetaphil Moisturizing Lotion  Clairol Herbal Essence Moisturizing Lotion, Dry Skin  Clairol Herbal Essence Moisturizing Lotion, Extra Dry Skin  Clairol Herbal Essence Moisturizing Lotion, Normal Skin  Curel Age Defying Therapeutic Moisturizing Lotion with Alpha  Hydroxy  Curel Extreme Care Body Lotion  Curel Soothing Hands Moisturizing Hand Lotion  Curel Therapeutic Moisturizing Cream, Fragrance-Free  Curel Therapeutic Moisturizing Lotion, Fragrance-Free  Curel Therapeutic Moisturizing Lotion, Original Formula  Eucerin Daily Replenishing Lotion  Eucerin Dry Skin Therapy Plus Alpha Hydroxy Crme  Eucerin Dry Skin Therapy Plus Alpha Hydroxy Lotion  Eucerin Original Crme  Eucerin Original Lotion  Eucerin Plus Crme Eucerin Plus Lotion  Eucerin TriLipid Replenishing Lotion  Keri Anti-Bacterial Hand Lotion  Keri Deep Conditioning Original Lotion Dry Skin Formula Softly Scented  Keri Deep Conditioning Original Lotion, Fragrance Free Sensitive Skin Formula  Keri Lotion Fast Absorbing Fragrance Free Sensitive Skin Formula  Keri Lotion Fast Absorbing Softly Scented Dry Skin Formula  Keri Original Lotion  Keri Skin Renewal Lotion Keri Silky Smooth Lotion  Keri Silky Smooth Sensitive Skin Lotion  Nivea Body Creamy Conditioning Oil  Nivea Body Extra Enriched Lotion  Nivea Body Original Lotion  Nivea Body Sheer Moisturizing Lotion Nivea Crme  Nivea Skin Firming Lotion  NutraDerm 30 Skin Lotion  NutraDerm Skin Lotion  NutraDerm Therapeutic Skin Cream  NutraDerm Therapeutic Skin Lotion  ProShield Protective Hand Cream  Provon moisturizing lotion  Preoperative Educational Videos for Total Hip, Knee and Shoulder Replacements  To better prepare for surgery, please view our videos that explain the physical activity and discharge planning required to have the best surgical recovery at [facility].  [email or web address]  Questions? Call [phone] or email [email or web address]     How to Use an Incentive Spirometer  An incentive spirometer is a tool that measures how well you are filling your lungs with each breath. Learning to take long, deep breaths using  this tool can help you keep your lungs clear and active. This may help to reverse or lessen your chance of developing breathing (pulmonary) problems, especially infection. You may be asked to use a spirometer: After a surgery. If you have a lung problem or a history of smoking. After a long period of time when you have been unable to move or be active. If the spirometer includes an indicator to show the highest number that you have reached, your health care provider or respiratory therapist will help you set a goal. Keep a log of your progress as told by your health care provider. What are the risks? Breathing too quickly may cause dizziness or cause you to pass out. Take your time so you do not get dizzy  or light-headed. If you are in pain, you may need to take pain medicine before doing incentive spirometry. It is harder to take a deep breath if you are having pain. How to use your incentive spirometer  Sit up on the edge of your bed or on a chair. Hold the incentive spirometer so that it is in an upright position. Before you use the spirometer, breathe out normally. Place the mouthpiece in your mouth. Make sure your lips are closed tightly around it. Breathe in slowly and as deeply as you can through your mouth, causing the piston or the ball to rise toward the top of the chamber. Hold your breath for 3-5 seconds, or for as long as possible. If the spirometer includes a coach indicator, use this to guide you in breathing. Slow down your breathing if the indicator goes above the marked areas. Remove the mouthpiece from your mouth and breathe out normally. The piston or ball will return to the bottom of the chamber. Rest for a few seconds, then repeat the steps 10 or more times. Take your time and take a few normal breaths between deep breaths so that you do not get dizzy or light-headed. Do this every 1-2 hours when you are awake. If the spirometer includes a goal marker to show the highest  number you have reached (best effort), use this as a goal to work toward during each repetition. After each set of 10 deep breaths, cough a few times. This will help to make sure that your lungs are clear. If you have an incision on your chest or abdomen from surgery, place a pillow or a rolled-up towel firmly against the incision when you cough. This can help to reduce pain while taking deep breaths and coughing. General tips When you are able to get out of bed: Walk around often. Continue to take deep breaths and cough in order to clear your lungs. Keep using the incentive spirometer until your health care provider says it is okay to stop using it. If you have been in the hospital, you may be told to keep using the spirometer at home. Contact a health care provider if: You are having difficulty using the spirometer. You have trouble using the spirometer as often as instructed. Your pain medicine is not giving enough relief for you to use the spirometer as told. You have a fever. Get help right away if: You develop shortness of breath. You develop a cough with bloody mucus from the lungs. You have fluid or blood coming from an incision site after you cough. Summary An incentive spirometer is a tool that can help you learn to take long, deep breaths to keep your lungs clear and active. You may be asked to use a spirometer after a surgery, if you have a lung problem or a history of smoking, or if you have been inactive for a long period of time. Use your incentive spirometer as instructed every 1-2 hours while you are awake. If you have an incision on your chest or abdomen, place a pillow or a rolled-up towel firmly against your incision when you cough. This will help to reduce pain. Get help right away if you have shortness of breath, you cough up bloody mucus, or blood comes from your incision when you cough. This information is not intended to replace advice given to you by your health care  provider. Make sure you discuss any questions you have with your health care provider. Document Revised: 06/25/2019  Document Reviewed: 06/25/2019 Elsevier Patient Education  2023 [employer].

## 2023-06-29 DIAGNOSIS — Z01818 Encounter for other preprocedural examination: Secondary | ICD-10-CM | POA: Diagnosis not present

## 2023-06-29 LAB — URINALYSIS, COMPLETE (UACMP) WITH MICROSCOPIC
Bacteria, UA: NONE SEEN
Bilirubin Urine: NEGATIVE
Glucose, UA: NEGATIVE mg/dL
Hgb urine dipstick: NEGATIVE
Ketones, ur: NEGATIVE mg/dL
Nitrite: NEGATIVE
Protein, ur: NEGATIVE mg/dL
Specific Gravity, Urine: 1.018 (ref 1.005–1.030)
pH: 6 (ref 5.0–8.0)

## 2023-07-03 LAB — IGE: IgE (Immunoglobulin E), Serum: 39 [IU]/mL (ref 6–495)

## 2023-07-05 ENCOUNTER — Encounter: Payer: Self-pay | Admitting: Orthopedic Surgery

## 2023-07-05 DIAGNOSIS — L309 Dermatitis, unspecified: Secondary | ICD-10-CM | POA: Insufficient documentation

## 2023-07-06 ENCOUNTER — Other Ambulatory Visit: Payer: Self-pay

## 2023-07-06 ENCOUNTER — Ambulatory Visit: Payer: Self-pay | Admitting: Urgent Care

## 2023-07-06 ENCOUNTER — Encounter
Admission: RE | Disposition: A | Discharge status: Skilled Nursing Facility | Payer: Self-pay | Source: Ambulatory Visit | Attending: Orthopedic Surgery

## 2023-07-06 ENCOUNTER — Observation Stay

## 2023-07-06 ENCOUNTER — Encounter: Payer: Self-pay | Admitting: Orthopedic Surgery

## 2023-07-06 ENCOUNTER — Observation Stay
Admission: RE | Admit: 2023-07-06 | Discharge: 2023-07-08 | Disposition: A | Discharge status: Skilled Nursing Facility | Source: Ambulatory Visit | Attending: Orthopedic Surgery | Admitting: Orthopedic Surgery

## 2023-07-06 DIAGNOSIS — Z880 Allergy status to penicillin: Secondary | ICD-10-CM

## 2023-07-06 DIAGNOSIS — Z96641 Presence of right artificial hip joint: Secondary | ICD-10-CM

## 2023-07-06 DIAGNOSIS — Z85118 Personal history of other malignant neoplasm of bronchus and lung: Secondary | ICD-10-CM | POA: Diagnosis not present

## 2023-07-06 DIAGNOSIS — M1611 Unilateral primary osteoarthritis, right hip: Secondary | ICD-10-CM | POA: Diagnosis present

## 2023-07-06 DIAGNOSIS — Z01812 Encounter for preprocedural laboratory examination: Secondary | ICD-10-CM

## 2023-07-06 DIAGNOSIS — Z87891 Personal history of nicotine dependence: Secondary | ICD-10-CM | POA: Insufficient documentation

## 2023-07-06 DIAGNOSIS — Z79899 Other long term (current) drug therapy: Secondary | ICD-10-CM | POA: Diagnosis not present

## 2023-07-06 DIAGNOSIS — I2510 Atherosclerotic heart disease of native coronary artery without angina pectoris: Secondary | ICD-10-CM | POA: Diagnosis not present

## 2023-07-06 DIAGNOSIS — R0602 Shortness of breath: Secondary | ICD-10-CM

## 2023-07-06 HISTORY — DX: Pure hypercholesterolemia, unspecified: E78.00

## 2023-07-06 HISTORY — DX: Cardiac arrhythmia, unspecified: I49.9

## 2023-07-06 HISTORY — PX: TOTAL HIP ARTHROPLASTY: SHX124

## 2023-07-06 LAB — ABO/RH: ABO/RH(D): A POS

## 2023-07-06 SURGERY — ARTHROPLASTY, HIP, TOTAL,POSTERIOR APPROACH
Anesthesia: Spinal | Site: Hip | Laterality: Right

## 2023-07-06 MED ORDER — PANTOPRAZOLE SODIUM 40 MG PO TBEC
40.0000 mg | DELAYED_RELEASE_TABLET | Freq: Two times a day (BID) | ORAL | Status: DC
  Administered 2023-07-06 – 2023-07-08 (×5): 40 mg via ORAL
  Filled 2023-07-06 (×5): qty 1

## 2023-07-06 MED ORDER — BUPIVACAINE HCL (PF) 0.5 % IJ SOLN
INTRAMUSCULAR | Status: DC | PRN
  Administered 2023-07-06: 3 mL

## 2023-07-06 MED ORDER — GLYCOPYRROLATE 0.2 MG/ML IJ SOLN
INTRAMUSCULAR | Status: AC
  Filled 2023-07-06: qty 1

## 2023-07-06 MED ORDER — TRANEXAMIC ACID-NACL 1000-0.7 MG/100ML-% IV SOLN
1000.0000 mg | Freq: Once | INTRAVENOUS | Status: AC
  Administered 2023-07-06: 1000 mg via INTRAVENOUS

## 2023-07-06 MED ORDER — ONDANSETRON HCL 4 MG/2ML IJ SOLN
INTRAMUSCULAR | Status: DC | PRN
  Administered 2023-07-06: 4 mg via INTRAVENOUS

## 2023-07-06 MED ORDER — TRANEXAMIC ACID-NACL 1000-0.7 MG/100ML-% IV SOLN
1000.0000 mg | INTRAVENOUS | Status: AC
  Administered 2023-07-06: 1000 mg via INTRAVENOUS

## 2023-07-06 MED ORDER — PROPOFOL 10 MG/ML IV BOLUS
INTRAVENOUS | Status: DC | PRN
  Administered 2023-07-06: 10 mg via INTRAVENOUS

## 2023-07-06 MED ORDER — ONDANSETRON HCL 4 MG PO TABS: 4.0000 mg | ORAL_TABLET | Freq: Four times a day (QID) | ORAL | Status: DC | PRN

## 2023-07-06 MED ORDER — SURGIPHOR WOUND IRRIGATION SYSTEM - OPTIME: TOPICAL | Status: DC | PRN

## 2023-07-06 MED ORDER — FENTANYL CITRATE (PF) 100 MCG/2ML IJ SOLN
25.0000 ug | INTRAMUSCULAR | Status: DC | PRN
  Administered 2023-07-06 (×4): 25 ug via INTRAVENOUS

## 2023-07-06 MED ORDER — ALUM & MAG HYDROXIDE-SIMETH 200-200-20 MG/5ML PO SUSP: 30.0000 mL | ORAL | Status: DC | PRN

## 2023-07-06 MED ORDER — LIDOCAINE HCL (CARDIAC) PF 100 MG/5ML IV SOSY
PREFILLED_SYRINGE | INTRAVENOUS | Status: DC | PRN
  Administered 2023-07-06: 10 mg via INTRAVENOUS

## 2023-07-06 MED ORDER — DROPERIDOL 2.5 MG/ML IJ SOLN: 0.6250 mg | Freq: Once | INTRAMUSCULAR | Status: DC | PRN

## 2023-07-06 MED ORDER — CELECOXIB 200 MG PO CAPS
400.0000 mg | ORAL_CAPSULE | Freq: Once | ORAL | Status: AC
  Administered 2023-07-06: 400 mg via ORAL

## 2023-07-06 MED ORDER — UMECLIDINIUM BROMIDE 62.5 MCG/ACT IN AEPB
1.0000 | INHALATION_SPRAY | Freq: Every day | RESPIRATORY_TRACT | Status: DC
  Administered 2023-07-08: 1 via RESPIRATORY_TRACT
  Filled 2023-07-06: qty 7

## 2023-07-06 MED ORDER — FENTANYL CITRATE (PF) 100 MCG/2ML IJ SOLN
INTRAMUSCULAR | Status: DC | PRN
  Administered 2023-07-06 (×4): 25 ug via INTRAVENOUS

## 2023-07-06 MED ORDER — ORAL CARE MOUTH RINSE: 15.0000 mL | Freq: Once | OROMUCOSAL | Status: AC

## 2023-07-06 MED ORDER — GABAPENTIN 300 MG PO CAPS
ORAL_CAPSULE | ORAL | Status: AC
  Filled 2023-07-06: qty 1

## 2023-07-06 MED ORDER — NIFEDIPINE ER OSMOTIC RELEASE 30 MG PO TB24
30.0000 mg | ORAL_TABLET | Freq: Every day | ORAL | Status: DC
  Filled 2023-07-06 (×2): qty 1

## 2023-07-06 MED ORDER — FLUTICASONE FUROATE-VILANTEROL 100-25 MCG/ACT IN AEPB
1.0000 | INHALATION_SPRAY | Freq: Every day | RESPIRATORY_TRACT | Status: DC
  Administered 2023-07-08: 1 via RESPIRATORY_TRACT
  Filled 2023-07-06: qty 28

## 2023-07-06 MED ORDER — DEXAMETHASONE SODIUM PHOSPHATE 10 MG/ML IJ SOLN
8.0000 mg | Freq: Once | INTRAMUSCULAR | Status: AC
  Administered 2023-07-06: 8 mg via INTRAVENOUS

## 2023-07-06 MED ORDER — EPHEDRINE 5 MG/ML INJ
INTRAVENOUS | Status: AC
  Filled 2023-07-06: qty 5

## 2023-07-06 MED ORDER — ONDANSETRON HCL 4 MG/2ML IJ SOLN
4.0000 mg | Freq: Four times a day (QID) | INTRAMUSCULAR | Status: DC | PRN
  Administered 2023-07-06: 4 mg via INTRAVENOUS
  Filled 2023-07-06: qty 2

## 2023-07-06 MED ORDER — TRANEXAMIC ACID-NACL 1000-0.7 MG/100ML-% IV SOLN
INTRAVENOUS | Status: AC
  Filled 2023-07-06: qty 100

## 2023-07-06 MED ORDER — OXYCODONE HCL 5 MG PO TABS
5.0000 mg | ORAL_TABLET | ORAL | Status: DC | PRN
  Administered 2023-07-06 – 2023-07-08 (×3): 5 mg via ORAL
  Filled 2023-07-06 (×3): qty 1

## 2023-07-06 MED ORDER — GLYCOPYRROLATE 0.2 MG/ML IJ SOLN
INTRAMUSCULAR | Status: DC | PRN
  Administered 2023-07-06: .2 mg via INTRAVENOUS

## 2023-07-06 MED ORDER — FENTANYL CITRATE (PF) 100 MCG/2ML IJ SOLN
INTRAMUSCULAR | Status: AC
  Filled 2023-07-06: qty 2

## 2023-07-06 MED ORDER — PROPOFOL 1000 MG/100ML IV EMUL
INTRAVENOUS | Status: AC
  Filled 2023-07-06: qty 100

## 2023-07-06 MED ORDER — PHENYLEPHRINE HCL-NACL 20-0.9 MG/250ML-% IV SOLN
INTRAVENOUS | Status: DC | PRN
  Administered 2023-07-06: 30 ug/min via INTRAVENOUS

## 2023-07-06 MED ORDER — EPHEDRINE SULFATE-NACL 50-0.9 MG/10ML-% IV SOSY
PREFILLED_SYRINGE | INTRAVENOUS | Status: DC | PRN
  Administered 2023-07-06: 5 mg via INTRAVENOUS

## 2023-07-06 MED ORDER — ACETAMINOPHEN 10 MG/ML IV SOLN
INTRAVENOUS | Status: DC | PRN
Start: 2023-07-06 — End: 2023-07-06
  Administered 2023-07-06: 1000 mg via INTRAVENOUS

## 2023-07-06 MED ORDER — CHLORHEXIDINE GLUCONATE 0.12 % MT SOLN
15.0000 mL | Freq: Once | OROMUCOSAL | Status: AC
  Administered 2023-07-06: 15 mL via OROMUCOSAL

## 2023-07-06 MED ORDER — BISACODYL 10 MG RE SUPP: 10.0000 mg | Freq: Every day | RECTAL | Status: DC | PRN

## 2023-07-06 MED ORDER — OXYCODONE HCL 5 MG PO TABS
5.0000 mg | ORAL_TABLET | Freq: Once | ORAL | Status: AC
  Administered 2023-07-06: 5 mg via ORAL

## 2023-07-06 MED ORDER — PHENYLEPHRINE HCL-NACL 20-0.9 MG/250ML-% IV SOLN
INTRAVENOUS | Status: AC
  Filled 2023-07-06: qty 250

## 2023-07-06 MED ORDER — PHENYLEPHRINE 80 MCG/ML (10ML) SYRINGE FOR IV PUSH (FOR BLOOD PRESSURE SUPPORT)
PREFILLED_SYRINGE | INTRAVENOUS | Status: DC | PRN
  Administered 2023-07-06 (×2): 160 ug via INTRAVENOUS

## 2023-07-06 MED ORDER — SODIUM CHLORIDE 0.9 % IR SOLN
Status: DC | PRN
  Administered 2023-07-06: 3000 mL

## 2023-07-06 MED ORDER — CHLORHEXIDINE GLUCONATE 4 % EX SOLN
60.0000 mL | Freq: Once | CUTANEOUS | Status: AC
  Administered 2023-07-06: 4 via TOPICAL

## 2023-07-06 MED ORDER — CELECOXIB 200 MG PO CAPS
ORAL_CAPSULE | ORAL | Status: AC
  Filled 2023-07-06: qty 2

## 2023-07-06 MED ORDER — CEFAZOLIN SODIUM-DEXTROSE 2-4 GM/100ML-% IV SOLN
2.0000 g | INTRAVENOUS | Status: AC
Start: 2023-07-06 — End: 2023-07-06
  Administered 2023-07-06: 2 g via INTRAVENOUS

## 2023-07-06 MED ORDER — TRAMADOL HCL 50 MG PO TABS: 50.0000 mg | ORAL_TABLET | ORAL | Status: DC | PRN

## 2023-07-06 MED ORDER — SENNOSIDES-DOCUSATE SODIUM 8.6-50 MG PO TABS
1.0000 | ORAL_TABLET | Freq: Two times a day (BID) | ORAL | Status: DC
  Administered 2023-07-06 – 2023-07-08 (×5): 1 via ORAL
  Filled 2023-07-06 (×5): qty 1

## 2023-07-06 MED ORDER — PHENOL 1.4 % MT LIQD: 1.0000 | OROMUCOSAL | Status: DC | PRN

## 2023-07-06 MED ORDER — ENSURE PRE-SURGERY PO LIQD
296.0000 mL | Freq: Once | ORAL | Status: AC
  Administered 2023-07-06: 296 mL via ORAL
  Filled 2023-07-06: qty 296

## 2023-07-06 MED ORDER — 0.9 % SODIUM CHLORIDE (POUR BTL) OPTIME
TOPICAL | Status: DC | PRN
  Administered 2023-07-06: 500 mL

## 2023-07-06 MED ORDER — CELECOXIB 200 MG PO CAPS
200.0000 mg | ORAL_CAPSULE | Freq: Two times a day (BID) | ORAL | Status: DC
  Administered 2023-07-06 – 2023-07-08 (×4): 200 mg via ORAL
  Filled 2023-07-06 (×4): qty 1

## 2023-07-06 MED ORDER — MENTHOL 3 MG MT LOZG: 1.0000 | LOZENGE | OROMUCOSAL | Status: DC | PRN

## 2023-07-06 MED ORDER — SODIUM CHLORIDE 0.9 % IV SOLN: INTRAVENOUS | Status: DC

## 2023-07-06 MED ORDER — HYDROMORPHONE HCL 1 MG/ML IJ SOLN: 0.5000 mg | INTRAMUSCULAR | Status: DC | PRN

## 2023-07-06 MED ORDER — TORSEMIDE 20 MG PO TABS
20.0000 mg | ORAL_TABLET | ORAL | Status: DC
  Administered 2023-07-07: 20 mg via ORAL
  Filled 2023-07-06: qty 1

## 2023-07-06 MED ORDER — ACETAMINOPHEN 10 MG/ML IV SOLN
1000.0000 mg | Freq: Four times a day (QID) | INTRAVENOUS | Status: AC
  Administered 2023-07-06 – 2023-07-07 (×4): 1000 mg via INTRAVENOUS
  Filled 2023-07-06 (×4): qty 100

## 2023-07-06 MED ORDER — OXYCODONE HCL 5 MG PO TABS
ORAL_TABLET | ORAL | Status: AC
Start: 2023-07-06 — End: ?
  Filled 2023-07-06: qty 1

## 2023-07-06 MED ORDER — BUPIVACAINE HCL (PF) 0.5 % IJ SOLN
INTRAMUSCULAR | Status: AC
  Filled 2023-07-06: qty 10

## 2023-07-06 MED ORDER — METOCLOPRAMIDE HCL 10 MG PO TABS
10.0000 mg | ORAL_TABLET | Freq: Three times a day (TID) | ORAL | Status: DC
Start: 2023-07-06 — End: 2023-07-08
  Administered 2023-07-06 – 2023-07-08 (×7): 10 mg via ORAL
  Filled 2023-07-06 (×7): qty 1

## 2023-07-06 MED ORDER — LOSARTAN POTASSIUM 50 MG PO TABS
50.0000 mg | ORAL_TABLET | Freq: Every day | ORAL | Status: DC
  Filled 2023-07-06 (×2): qty 1

## 2023-07-06 MED ORDER — DICYCLOMINE HCL 20 MG PO TABS: 20.0000 mg | ORAL_TABLET | Freq: Three times a day (TID) | ORAL | Status: DC | PRN

## 2023-07-06 MED ORDER — PROPOFOL 10 MG/ML IV BOLUS
INTRAVENOUS | Status: AC
  Filled 2023-07-06: qty 20

## 2023-07-06 MED ORDER — MAGNESIUM HYDROXIDE 400 MG/5ML PO SUSP
30.0000 mL | Freq: Every day | ORAL | Status: DC
  Administered 2023-07-08: 30 mL via ORAL
  Filled 2023-07-06: qty 30

## 2023-07-06 MED ORDER — CEFAZOLIN SODIUM-DEXTROSE 2-4 GM/100ML-% IV SOLN
INTRAVENOUS | Status: AC
  Filled 2023-07-06: qty 100

## 2023-07-06 MED ORDER — PROPOFOL 500 MG/50ML IV EMUL
INTRAVENOUS | Status: DC | PRN
Start: 2023-07-06 — End: 2023-07-06
  Administered 2023-07-06: 80 ug/kg/min via INTRAVENOUS

## 2023-07-06 MED ORDER — LACTATED RINGERS IV SOLN: INTRAVENOUS | Status: DC

## 2023-07-06 MED ORDER — CEFAZOLIN SODIUM-DEXTROSE 2-4 GM/100ML-% IV SOLN
2.0000 g | Freq: Four times a day (QID) | INTRAVENOUS | Status: AC
  Administered 2023-07-06 (×2): 2 g via INTRAVENOUS
  Filled 2023-07-06 (×2): qty 100

## 2023-07-06 MED ORDER — ALPRAZOLAM 0.25 MG PO TABS: 0.2500 mg | ORAL_TABLET | Freq: Three times a day (TID) | ORAL | Status: DC | PRN

## 2023-07-06 MED ORDER — CHLORHEXIDINE GLUCONATE 0.12 % MT SOLN
OROMUCOSAL | Status: AC
  Filled 2023-07-06: qty 15

## 2023-07-06 MED ORDER — SERTRALINE HCL 50 MG PO TABS
50.0000 mg | ORAL_TABLET | Freq: Every day | ORAL | Status: DC
  Administered 2023-07-07 – 2023-07-08 (×2): 50 mg via ORAL
  Filled 2023-07-06 (×2): qty 1

## 2023-07-06 MED ORDER — DIPHENHYDRAMINE HCL 12.5 MG/5ML PO ELIX: 12.5000 mg | ORAL_SOLUTION | ORAL | Status: DC | PRN

## 2023-07-06 MED ORDER — METHOCARBAMOL 500 MG PO TABS: 500.0000 mg | ORAL_TABLET | Freq: Three times a day (TID) | ORAL | Status: DC | PRN

## 2023-07-06 MED ORDER — GABAPENTIN 300 MG PO CAPS
300.0000 mg | ORAL_CAPSULE | Freq: Once | ORAL | Status: AC
  Administered 2023-07-06: 300 mg via ORAL

## 2023-07-06 MED ORDER — FLEET ENEMA RE ENEM: 1.0000 | ENEMA | Freq: Once | RECTAL | Status: DC | PRN

## 2023-07-06 MED ORDER — FLUTICASONE PROPIONATE 50 MCG/ACT NA SUSP: 1.0000 | Freq: Every day | NASAL | Status: DC | PRN

## 2023-07-06 MED ORDER — DEXAMETHASONE SODIUM PHOSPHATE 10 MG/ML IJ SOLN
INTRAMUSCULAR | Status: AC
  Filled 2023-07-06: qty 1

## 2023-07-06 MED ORDER — ACETAMINOPHEN 325 MG PO TABS: 325.0000 mg | ORAL_TABLET | Freq: Four times a day (QID) | ORAL | Status: DC | PRN

## 2023-07-06 MED ORDER — ASPIRIN 81 MG PO CHEW
81.0000 mg | CHEWABLE_TABLET | Freq: Two times a day (BID) | ORAL | Status: DC
  Administered 2023-07-07 – 2023-07-08 (×3): 81 mg via ORAL
  Filled 2023-07-06 (×3): qty 1

## 2023-07-06 MED ORDER — OXYCODONE HCL 5 MG PO TABS: 10.0000 mg | ORAL_TABLET | ORAL | Status: DC | PRN

## 2023-07-06 SURGICAL SUPPLY — 49 items
BLADE CLIPPER SURG (BLADE) IMPLANT
BLADE SAW 90X25X1.19 OSCILLAT (BLADE) ×1 IMPLANT
BRUSH SCRUB EZ PLAIN DRY (MISCELLANEOUS) ×1 IMPLANT
DRAPE INCISE IOBAN 66X60 STRL (DRAPES) ×1 IMPLANT
DRAPE SHEET LG 3/4 BI-LAMINATE (DRAPES) ×1 IMPLANT
DRSG AQUACEL AG ADV 3.5X14 (GAUZE/BANDAGES/DRESSINGS) ×1 IMPLANT
DRSG MEPILEX SACRM 8.7X9.8 (GAUZE/BANDAGES/DRESSINGS) ×1 IMPLANT
DRSG TEGADERM 4X4.75 (GAUZE/BANDAGES/DRESSINGS) ×1 IMPLANT
DURAPREP 26ML APPLICATOR (WOUND CARE) ×2 IMPLANT
ELECT CAUTERY BLADE 6.4 (BLADE) ×1 IMPLANT
ELECT REM PT RETURN 9FT ADLT (ELECTROSURGICAL) ×1 IMPLANT
ELECTRODE REM PT RTRN 9FT ADLT (ELECTROSURGICAL) ×1 IMPLANT
EVACUATOR 1/8 PVC DRAIN (DRAIN) ×1 IMPLANT
GAUZE XEROFORM 1X8 LF (GAUZE/BANDAGES/DRESSINGS) IMPLANT
GLOVE BIOGEL M STRL SZ7.5 (GLOVE) ×6 IMPLANT
GLOVE SURG UNDER POLY LF SZ8 (GLOVE) ×2 IMPLANT
GOWN STRL REUS W/ TWL LRG LVL3 (GOWN DISPOSABLE) ×2 IMPLANT
GOWN STRL REUS W/ TWL XL LVL3 (GOWN DISPOSABLE) ×1 IMPLANT
GOWN TOGA ZIPPER T7+ PEEL AWAY (MISCELLANEOUS) ×1 IMPLANT
HANDLE YANKAUER SUCT OPEN TIP (MISCELLANEOUS) ×1 IMPLANT
HEAD M SROM 36MM PLUS 1.5 (Hips) IMPLANT
HOLDER FOLEY CATH W/STRAP (MISCELLANEOUS) ×1 IMPLANT
HOOD PEEL AWAY T7 (MISCELLANEOUS) ×1 IMPLANT
KIT PEG BOARD PINK (KITS) ×1 IMPLANT
KIT TURNOVER KIT A (KITS) ×1 IMPLANT
LINER MARATHON 4 NEUTRAL 36X56 (Hips) IMPLANT
MANIFOLD NEPTUNE II (INSTRUMENTS) ×2 IMPLANT
NS IRRIG 500ML POUR BTL (IV SOLUTION) ×1 IMPLANT
PACK HIP PROSTHESIS (MISCELLANEOUS) ×1 IMPLANT
PENCIL SMOKE EVACUATOR COATED (MISCELLANEOUS) IMPLANT
PIN SECT CUP 56MM (Hips) IMPLANT
PIN STEIN THRED 5/32 (Pin) ×1 IMPLANT
PULSAVAC PLUS IRRIG FAN TIP (DISPOSABLE) ×1 IMPLANT
SOL .9 NS 3000ML IRR UROMATIC (IV SOLUTION) ×1 IMPLANT
SOLUTION IRRIG SURGIPHOR (IV SOLUTION) ×1 IMPLANT
SPONGE DRAIN TRACH 4X4 STRL 2S (GAUZE/BANDAGES/DRESSINGS) ×1 IMPLANT
SROM M HEAD 36MM PLUS 1.5 (Hips) ×1 IMPLANT
STAPLER SKIN PROX 35W (STAPLE) ×1 IMPLANT
STEM FEM ACTIS HIGH SZ7 (Stem) IMPLANT
SUT ETHIBOND #5 BRAIDED 30INL (SUTURE) ×1 IMPLANT
SUT VIC AB 0 CT1 36 (SUTURE) ×2 IMPLANT
SUT VIC AB 1 CT1 36 (SUTURE) ×2 IMPLANT
SUT VIC AB 2-0 CT1 TAPERPNT 27 (SUTURE) ×1 IMPLANT
TAPE CLOTH 3X10 WHT NS LF (GAUZE/BANDAGES/DRESSINGS) ×1 IMPLANT
TIP FAN IRRIG PULSAVAC PLUS (DISPOSABLE) ×1 IMPLANT
TOWEL OR 17X26 4PK STRL BLUE (TOWEL DISPOSABLE) IMPLANT
TRAP FLUID SMOKE EVACUATOR (MISCELLANEOUS) ×1 IMPLANT
TRAY FOLEY MTR SLVR 16FR STAT (SET/KITS/TRAYS/PACK) ×1 IMPLANT
WATER STERILE IRR 1000ML POUR (IV SOLUTION) ×1 IMPLANT

## 2023-07-06 NOTE — Transfer of Care (Signed)
 Immediate Anesthesia Transfer of Care Note  Patient: Bridget Deleon  Procedure(s) Performed: ARTHROPLASTY, HIP, TOTAL,POSTERIOR APPROACH (Right: Hip)  Patient Location: PACU  Anesthesia Type:General  Level of Consciousness: awake and drowsy  Airway & Oxygen Therapy: Patient Spontanous Breathing and Patient connected to face mask oxygen  Post-op Assessment: Report given to RN and Post -op Vital signs reviewed and stable  Post vital signs: Reviewed and stable  Last Vitals:  Vitals Value Taken Time  BP 92/73 07/06/23 1111  Temp 36.2 C 07/06/23 1111  Pulse 70 07/06/23 1114  Resp 21 07/06/23 1114  SpO2 100 % 07/06/23 1114  Vitals shown include unfiled device data.  Last Pain:  Vitals:   07/06/23 1111  TempSrc:   PainSc: 0-No pain         Complications: No notable events documented.

## 2023-07-06 NOTE — Interval H&P Note (Signed)
 History and Physical Interval Note:  07/06/2023 6:23 AM  Bridget Deleon  has presented today for surgery, with the diagnosis of PRIMARY OSTEOARTHRITIS OF RIGHT HIP.  The various methods of treatment have been discussed with the patient and family. After consideration of risks, benefits and other options for treatment, the patient has consented to  Procedure(s): ARTHROPLASTY, HIP, TOTAL,POSTERIOR APPROACH (Right) as a surgical intervention.  The patient's history has been reviewed, patient examined, no change in status, stable for surgery.  I have reviewed the patient's chart and labs.  Questions were answered to the patient's satisfaction.     Kumar Falwell P Launa Goedken

## 2023-07-06 NOTE — Progress Notes (Signed)
 Patient will follow up with [facility] in 6 weeks for re-evaluation

## 2023-07-06 NOTE — Evaluation (Signed)
 Physical Therapy Evaluation Patient Details Name: Bridget Deleon MRN: [id number] DOB: 06/24/1940 Today's Date: 07/06/2023  History of Present Illness  Patient is an 83 year old female s/p R THA. Posterior hip precautions.  Clinical Impression  Patient received in bed, she is agreeable to PT assessment. Patient reports moderate pain in right groin. She required mod A for bed mobility and cues for safety. Min A to stand from slightly elevated bed. Cga to take a few steps from bed to recliner. Patient is mainly pain limited. We reviewed posterior hip precautions. Patient will need continued instruction of this. She will continue to benefit from skilled PT to improve independence and safety with mobility.          If plan is discharge home, recommend the following: A lot of help with walking and/or transfers;A lot of help with bathing/dressing/bathroom;Assist for transportation;Help with stairs or ramp for entrance;Assistance with cooking/housework   Can travel by private vehicle    no    Equipment Recommendations Rolling walker (2 wheels)  Recommendations for Other Services       Functional Status Assessment Patient has had a recent decline in their functional status and demonstrates the ability to make significant improvements in function in a reasonable and predictable amount of time.     Precautions / Restrictions Precautions Precautions: Fall Recall of Precautions/Restrictions: Impaired Restrictions Weight Bearing Restrictions Per Provider Order: Yes RLE Weight Bearing Per Provider Order: Weight bearing as tolerated      Mobility  Bed Mobility Overal bed mobility: Needs Assistance Bed Mobility: Supine to Sit     Supine to sit: Min assist, HOB elevated, Used rails     General bed mobility comments: Patient required min A to move LEs off bed.    Transfers Overall transfer level: Needs assistance Equipment used: Rolling walker (2 wheels) Transfers: Sit to/from  Stand Sit to Stand: Contact guard assist           General transfer comment: Cues for weight bearing and precautions    Ambulation/Gait Ambulation/Gait assistance: Contact guard assist Gait Distance (Feet): 3 Feet Assistive device: Rolling walker (2 wheels) Gait Pattern/deviations: Decreased step length - right, Decreased step length - left Gait velocity: decreased     General Gait Details: Patient ambulated from bed to recliner with RW and cga. Moderate pain in right groin area.  Stairs            Wheelchair Mobility     Tilt Bed    Modified Rankin (Stroke Patients Only)       Balance Overall balance assessment: Needs assistance Sitting-balance support: Feet supported Sitting balance-Leahy Scale: Good     Standing balance support: Bilateral upper extremity supported, During functional activity, Reliant on assistive device for balance Standing balance-Leahy Scale: Good                               Pertinent Vitals/Pain Pain Assessment Pain Assessment: Faces Faces Pain Scale: Hurts even more Pain Descriptors / Indicators: Discomfort, Grimacing, Guarding Pain Intervention(s): Monitored during session, Repositioned, Ice applied    Home Living Family/patient expects to be discharged to:: Skilled nursing facility Living Arrangements: Other relatives                      Prior Function Prior Level of Function : Independent/Modified Independent             Mobility Comments: Uses a rollator at  baseline ADLs Comments: independent     Extremity/Trunk Assessment   Upper Extremity Assessment Upper Extremity Assessment: Overall WFL for tasks assessed    Lower Extremity Assessment Lower Extremity Assessment: RLE deficits/detail RLE: Unable to fully assess due to pain RLE Coordination: decreased gross motor    Cervical / Trunk Assessment Cervical / Trunk Assessment: Normal  Communication   Communication Communication: No  apparent difficulties    Cognition Arousal: Alert Behavior During Therapy: WFL for tasks assessed/performed   PT - Cognitive impairments: No apparent impairments                         Following commands: Intact       Cueing Cueing Techniques: Verbal cues     General Comments      Exercises     Assessment/Plan    PT Assessment Patient needs continued PT services  PT Problem List Decreased strength;Decreased range of motion;Decreased activity tolerance;Decreased balance;Decreased mobility;Pain;Decreased knowledge of use of DME;Decreased safety awareness;Decreased knowledge of precautions       PT Treatment Interventions DME instruction;Gait training;Functional mobility training;Therapeutic activities;Stair training;Therapeutic exercise;Balance training;Neuromuscular re-education;Cognitive remediation;Patient/family education    PT Goals (Current goals can be found in the Care Plan section)  Acute Rehab PT Goals Patient Stated Goal: to go to rehab then home, son in law she lives with works during the day PT Goal Formulation: With patient/family Time For Goal Achievement: 07/13/23 Potential to Achieve Goals: Good    Frequency BID     Co-evaluation               AM-PAC PT "6 Clicks" Mobility  Outcome Measure Help needed turning from your back to your side while in a flat bed without using bedrails?: A Lot Help needed moving from lying on your back to sitting on the side of a flat bed without using bedrails?: A Lot Help needed moving to and from a bed to a chair (including a wheelchair)?: A Little Help needed standing up from a chair using your arms (e.g., wheelchair or bedside chair)?: A Little Help needed to walk in hospital room?: A Lot Help needed climbing 3-5 steps with a railing? : A Lot 6 Click Score: 14    End of Session Equipment Utilized During Treatment: Gait belt Activity Tolerance: Patient limited by pain Patient left: in chair;with  call bell/phone within reach;with family/visitor present Nurse Communication: Mobility status PT Visit Diagnosis: Other abnormalities of gait and mobility (R26.89);Muscle weakness (generalized) (M62.81);Difficulty in walking, not elsewhere classified (R26.2);Pain Pain - Right/Left: Right Pain - part of body: Hip    Time: 1610-9604 PT Time Calculation (min) (ACUTE ONLY): 27 min   Charges:   PT Evaluation $PT Eval Moderate Complexity: 1 Mod PT Treatments $Therapeutic Activity: 8-22 mins PT General Charges $$ ACUTE PT VISIT: 1 Visit         Evert Wenrich, PT, GCS 07/06/23,3:29 PM

## 2023-07-06 NOTE — Discharge Summary (Incomplete)
 Physician Discharge Summary  Subjective: 2 Days Post-Op Procedure(s) (LRB): ARTHROPLASTY, HIP, TOTAL,POSTERIOR APPROACH (Right) Patient reports pain as mild.   Patient seen in rounds with Dr. Ernest Pine. Patient is well, and has had no acute complaints or problems. Denies any CP, SOB, N/V, fevers or chills We will start therapy today.  Plan is to go SNF after hospital stay.  Physician Discharge Summary  Patient ID: Bridget Deleon MRN: [id number] DOB/AGE: 1940-10-07 83 y.o.  Admit date: 07/06/2023 Discharge date: 07/08/2023  Admission Diagnoses:  Discharge Diagnoses:  Principal Problem:   Hx of total hip arthroplasty, right   Discharged Condition: good  Hospital Course: Patient presented to the hospital on 07/06/2023 for an elective right THA performed by Dr. Ernest Pine. Patient was given 1g of TXA and 2g of Ancef prior to the procedure. she tolerated the procedure well without any complications. See procedural note below for details. Postoperatively, the patient did very well. she was able to pass PT protocols on post-op day one without any issues. JP drain was removed without any difficulty and was intact. she was able to void her bladder without any difficulty. Physical exam was unremarkable. she denies any SOB, CP, N/V, fevers or chills. Vital signs are stable. Patient is stable to discharge to skilled nursing.  PROCEDURE:  Right total hip arthroplasty   SURGEON:  Jena Gauss. M.D.   ASSISTANT:  Gean Birchwood, PA-C (present and scrubbed throughout the case, critical for assistance with exposure, retraction, instrumentation, and closure)   ANESTHESIA: spinal   ESTIMATED BLOOD LOSS: 100 mL   FLUIDS REPLACED: 800 mL of crystalloid   DRAINS: 2 medium Hemovac drains   IMPLANTS UTILIZED: DePuy size 7 high offset Actis femoral stem, 56 mm OD Pinnacle 100 acetabular component, +4 mm neutral Pinnacle Marathon polyethylene insert, and a 36 mm M-SPEC +1.5 mm hip  ball  Treatments: none  Discharge Exam: Blood pressure (!) 117/53, pulse 88, temperature 98.2 F (36.8 C), resp. rate 16, height 5\' 9"  (1.753 m), weight 60.1 kg, SpO2 99%.   Disposition: home   Allergies as of 07/08/2023       Reactions   Iron Rash   Penicillins Rash   IgE = 39 (WNL) on 06/28/2023   Adhesive [tape]    Tears skin   Calamine    Burns skin   Codeine Nausea And Vomiting   Vicodin [hydrocodone-acetaminophen] Nausea And Vomiting        Medication List     STOP taking these medications    ibuprofen 800 MG tablet Commonly known as: ADVIL       TAKE these medications    acetaminophen 500 MG tablet Commonly known as: TYLENOL Take 1,000 mg by mouth every 8 (eight) hours as needed for moderate pain (pain score 4-6).   alendronate 70 MG tablet Commonly known as: FOSAMAX Take 70 mg by mouth every 7 (seven) days. On Saturday.  Take with a full glass of water on an empty stomach.   ALPRAZolam 0.25 MG tablet Commonly known as: XANAX Take by mouth.   ascorbic acid 500 MG tablet Commonly known as: VITAMIN C Take 500 mg by mouth daily.   aspirin 81 MG chewable tablet Chew 1 tablet (81 mg total) by mouth 2 (two) times daily.   B-12 2500 MCG Tabs Take 2,500 mcg by mouth daily at 6 (six) AM.   benzonatate 200 MG capsule Commonly known as: TESSALON Take 200 mg by mouth 3 (three) times daily as needed for cough.  bisacodyl 5 MG EC tablet Commonly known as: DULCOLAX Take 5 mg by mouth at bedtime.   CALCIUM CITRATE + D PO Take 1 tablet by mouth daily.   Calcium-D 600-400 MG-UNIT Tabs Take 1 tablet by mouth every morning.   celecoxib 200 MG capsule Commonly known as: CELEBREX Take 1 capsule (200 mg total) by mouth 2 (two) times daily.   chlorpheniramine 4 MG tablet Commonly known as: CHLOR-TRIMETON Take 4 mg by mouth daily.   cholecalciferol 1000 units tablet Commonly known as: VITAMIN D Take 1,000 Units by mouth every morning.   CRANBERRY  PO Take 1,500 mg by mouth every morning.   dicyclomine 20 MG tablet Commonly known as: BENTYL Take 20 mg by mouth 3 (three) times daily as needed for spasms.   famotidine 20 MG tablet Commonly known as: PEPCID Take by mouth.   fluticasone 50 MCG/ACT nasal spray Commonly known as: FLONASE Place 1 spray into both nostrils daily.   gentamicin cream 0.1 % Commonly known as: GARAMYCIN Apply 1 application topically 3 (three) times daily as needed.   guaiFENesin-dextromethorphan 100-10 MG/5ML syrup Commonly known as: ROBITUSSIN DM Take 5 mLs by mouth every 4 (four) hours as needed for cough.   hydrocortisone 2.5 % cream Apply topically 2 (two) times daily as needed.   L-Lysine 500 MG Caps Take 500 mg by mouth daily.   losartan 100 MG tablet Commonly known as: COZAAR Take 50 mg by mouth daily.   methocarbamol 500 MG tablet Commonly known as: ROBAXIN Take 500 mg by mouth 3 (three) times daily as needed for muscle spasms.   NIFEdipine 30 MG 24 hr tablet Commonly known as: PROCARDIA-XL/NIFEDICAL-XL Take 30 mg by mouth daily.   ondansetron 8 MG disintegrating tablet Commonly known as: ZOFRAN-ODT Take 8 mg by mouth every 8 (eight) hours as needed for nausea or vomiting.   oxyCODONE 5 MG immediate release tablet Commonly known as: Oxy IR/ROXICODONE Take 1 tablet (5 mg total) by mouth every 4 (four) hours as needed for moderate pain (pain score 4-6) (pain score 4-6).   senna-docusate 8.6-50 MG tablet Commonly known as: Senokot-S Take 2 tablets by mouth at bedtime as needed. For constipation   sertraline 50 MG tablet Commonly known as: ZOLOFT Take 1 tablet by mouth daily.   torsemide 20 MG tablet Commonly known as: DEMADEX Take 20 mg by mouth 3 (three) times a week.   traMADol 50 MG tablet Commonly known as: ULTRAM Take 1 tablet (50 mg total) by mouth every 6 (six) hours as needed for moderate pain (pain score 4-6). What changed:  how much to take reasons to take  this   Trelegy Ellipta 100-62.5-25 MCG/ACT Aepb Generic drug: Fluticasone-Umeclidin-Vilant Inhale 1 puff into the lungs daily.   triamcinolone cream 0.1 % Commonly known as: KENALOG Apply 1 application topically 2 (two) times daily as needed.               Durable Medical Equipment  (From admission, onward)           Start     Ordered   07/06/23 1107  DME Walker rolling  Once       Question:  Patient needs a walker to treat with the following condition  Answer:  S/P total hip arthroplasty   07/06/23 1106   07/06/23 1107  DME Bedside commode  Once       Comments: Patient is not able to walk the distance required to go the bathroom, or he/she is unable  to safely negotiate stairs required to access the bathroom.  A 3in1 BSC will alleviate this problem  Question:  Patient needs a bedside commode to treat with the following condition  Answer:  S/P total hip arthroplasty   07/06/23 1106            Contact information for follow-up providers     Hooten, Illene Labrador, MD Follow up on 08/18/2023.   Specialty: Orthopedic Surgery Why: at 2:00pm Contact information: [address] [facility] [place] [address] [phone]              Contact information for after-discharge care     Destination     [facility] .   Service: Skilled Nursing Contact information: [address] [place] [address] [phone]                     Signed: Lenard Forth, Sareen Randon 07/08/2023, 6:10 AM   Objective: Vital signs in last 24 hours: Temp:  [98.2 F (36.8 C)-98.7 F (37.1 C)] 98.2 F (36.8 C) (03/20 2315) Pulse Rate:  [74-88] 88 (03/20 2315) Resp:  [16] 16 (03/20 2315) BP: (91-117)/(43-60) 117/53 (03/20 2315) SpO2:  [93 %-99 %] 99 % (03/20 2315)  Intake/Output from previous day:  Intake/Output Summary (Last 24 hours) at 07/08/2023 0610 Last data filed at 07/08/2023 0300 Gross per 24 hour  Intake 1550 ml   Output --  Net 1550 ml    Intake/Output this shift: Total I/O In: 165 [I.V.:165] Out: -   Labs: No results for input(s): "HGB" in the last 72 hours. No results for input(s): "WBC", "RBC", "HCT", "PLT" in the last 72 hours. No results for input(s): "NA", "K", "CL", "CO2", "BUN", "CREATININE", "GLUCOSE", "CALCIUM" in the last 72 hours. No results for input(s): "LABPT", "INR" in the last 72 hours.  EXAM: General - Patient is Alert, Appropriate, and Oriented Extremity - Neurologically intact Neurovascular intact Sensation intact distally Intact pulses distally Dorsiflexion/Plantar flexion intact No cellulitis present Compartment soft Dressing - dressing C/D/I and no drainage Motor Function - intact, moving foot and toes well on exam.  Ambulated 50 feet JP Drain pulled without difficulty. Intact  Assessment/Plan: 2 Days Post-Op Procedure(s) (LRB): ARTHROPLASTY, HIP, TOTAL,POSTERIOR APPROACH (Right) Procedure(s) (LRB): ARTHROPLASTY, HIP, TOTAL,POSTERIOR APPROACH (Right) Past Medical History:  Diagnosis Date   Arrhythmia    Arthritis    osteoarthritis   Cancer (HCC) 2010   right lower lobe cancer    Complication of anesthesia 23 yrs ago .    woke up on operating table, also A-fib for short time after Lung surgery   COPD (chronic obstructive pulmonary disease) (HCC)    Coronary artery disease 2010   woke up and states she was told  bottom of heart was asleep ... takes diltiazem for that reason .   GERD (gastroesophageal reflux disease)    Hypercholesterolemia    Ovarian ca (HCC) 1972   Wears dentures    full upper and lower   Principal Problem:   Hx of total hip arthroplasty, right  Estimated body mass index is 19.58 kg/m as calculated from the following:   Height as of this encounter: 5\' 9"  (1.753 m).   Weight as of this encounter: 60.1 kg.  Patient will continue to work with physical therapy to pass postoperative PT protocols, ROM and strengthening   Hip  Preacutions   Discussed with the patient continuing to utilize ice over the bandage   Patient will  wear TED hose bilaterally to help prevent DVT and clot formation   Discussed the Aquacel bandage.  This bandage will stay in place 7 days postoperatively.  Can be replaced with honeycomb bandages that will be sent home with the patient   Discussed sending the patient to rehab with tramadol and oxycodone for as needed pain management.  Patient will also be sent home with Celebrex to help with swelling and inflammation.  Patient will take an 81 mg aspirin twice daily for DVT prophylaxis   JP drain removed without difficulty, intact   Weight-Bearing as tolerated to right leg   Patient will follow-up with [facility] orthopedics in 6 weeks for re-imaging and reevaluation  Diet - Regular diet Follow up - in 6 weeks Activity - WBAT Disposition -rehab Condition Upon Discharge - Good DVT Prophylaxis - Aspirin and TED hose  Danise Edge, PA-C Orthopaedic Surgery 07/08/2023, 6:10 AM

## 2023-07-06 NOTE — Anesthesia Preprocedure Evaluation (Signed)
 Anesthesia Evaluation  Patient identified by MRN, date of birth, ID band Patient awake    Reviewed: Allergy & Precautions, NPO status , Patient's Chart, lab work & pertinent test results  History of Anesthesia Complications Negative for: history of anesthetic complications  Airway Mallampati: IV  TM Distance: >3 FB Neck ROM: Full    Dental  (+) Lower Dentures, Upper Dentures, Dental Advidsory Given   Pulmonary neg shortness of breath, COPD, neg recent URI, former smoker S/p lobectomy 2/2 lung cancer   Pulmonary exam normal breath sounds clear to auscultation       Cardiovascular hypertension, (-) angina + CAD  (-) Past MI, (-) Cardiac Stents and (-) CABG + dysrhythmias Atrial Fibrillation (-) Valvular Problems/Murmurs Rhythm:Regular Rate:Normal  Pt with excellent functional capacity.  Multiple flights of stairs with no difficulty.    Neuro/Psych  PSYCHIATRIC DISORDERS Anxiety     negative neurological ROS     GI/Hepatic ,GERD  ,,  Endo/Other  negative endocrine ROS    Renal/GU negative Renal ROS     Musculoskeletal  (+) Arthritis ,    Abdominal   Peds  Hematology Ovarian and lung CA   Anesthesia Other Findings Past Medical History: No date: Arrhythmia No date: Arthritis     Comment:  osteoarthritis 2010: Cancer (HCC)     Comment:  right lower lobe cancer  23 yrs ago . : Complication of anesthesia     Comment:  woke up on operating table, also A-fib for short time               after Lung surgery No date: COPD (chronic obstructive pulmonary disease) (HCC) 2010: Coronary artery disease     Comment:  woke up and states she was told  bottom of heart was               asleep ... takes diltiazem for that reason . No date: GERD (gastroesophageal reflux disease) No date: Hypercholesterolemia 1972: Ovarian ca (HCC) No date: Wears dentures     Comment:  full upper and lower   Reproductive/Obstetrics                               Anesthesia Physical Anesthesia Plan  ASA: 2  Anesthesia Plan: Spinal   Post-op Pain Management:    Induction: Intravenous  PONV Risk Score and Plan: 2 and Propofol infusion and TIVA  Airway Management Planned: Natural Airway and Simple Face Mask  Additional Equipment:   Intra-op Plan:   Post-operative Plan:   Informed Consent: I have reviewed the patients History and Physical, chart, labs and discussed the procedure including the risks, benefits and alternatives for the proposed anesthesia with the patient or authorized representative who has indicated his/her understanding and acceptance.       Plan Discussed with: CRNA  Anesthesia Plan Comments:          Anesthesia Quick Evaluation

## 2023-07-06 NOTE — Op Note (Signed)
 OPERATIVE NOTE  DATE OF SURGERY:  07/06/2023  PATIENT NAME:  Bridget Deleon   DOB: 1940/10/09  MRN: [id number]  PRE-OPERATIVE DIAGNOSIS: Degenerative arthrosis of the right hip, primary  POST-OPERATIVE DIAGNOSIS:  Same  PROCEDURE:  Right total hip arthroplasty  SURGEON:  Jena Gauss. M.D.  ASSISTANT:  Gean Birchwood, PA-C (present and scrubbed throughout the case, critical for assistance with exposure, retraction, instrumentation, and closure)  ANESTHESIA: spinal  ESTIMATED BLOOD LOSS: 100 mL  FLUIDS REPLACED: 800 mL of crystalloid  DRAINS: 2 medium Hemovac drains  IMPLANTS UTILIZED: DePuy size 7 high offset Actis femoral stem, 56 mm OD Pinnacle 100 acetabular component, +4 mm neutral Pinnacle Marathon polyethylene insert, and a 36 mm M-SPEC +1.5 mm hip ball  INDICATIONS FOR SURGERY: Bridget Deleon is a 83 y.o. year old female with a long history of progressive hip and groin  pain. X-rays demonstrated severe degenerative changes. The patient had not seen any significant improvement despite conservative nonsurgical intervention. After discussion of the risks and benefits of surgical intervention, the patient expressed understanding of the risks benefits and agree with plans for total hip arthroplasty.   The risks, benefits, and alternatives were discussed at length including but not limited to the risks of infection, bleeding, nerve injury, stiffness, blood clots, the need for revision surgery, limb length inequality, dislocation, cardiopulmonary complications, among others, and they were willing to proceed.  PROCEDURE IN DETAIL: The patient was brought into the operating room and, after adequate spinal anesthesia was achieved, the patient was placed in a left lateral decubitus position. Axillary roll was placed and all bony prominences were well-padded. The patient's right hip was cleaned and prepped with alcohol and DuraPrep and draped in the usual sterile fashion. A  "timeout" was performed as per usual protocol. A lateral curvilinear incision was made gently curving towards the posterior superior iliac spine. The IT band was incised in line with the skin incision and the fibers of the gluteus maximus were split in line. The piriformis tendon was identified, skeletonized, and incised at its insertion to the proximal femur and reflected posteriorly. A T type posterior capsulotomy was performed. Prior to dislocation of the femoral head, a threaded Steinmann pin was inserted through a separate stab incision into the pelvis superior to the acetabulum and bent in the form of a stylus so as to assess limb length and hip offset throughout the procedure. The femoral head was then dislocated posteriorly. Inspection of the femoral head demonstrated severe degenerative changes with full-thickness loss of articular cartilage. The femoral neck cut was performed using an oscillating saw. The anterior capsule was elevated off of the femoral neck using a periosteal elevator. Attention was then directed to the acetabulum. The remnant of the labrum was excised using electrocautery. Inspection of the acetabulum also demonstrated significant degenerative changes. The acetabulum was reamed in sequential fashion up to a 55 mm diameter. Good punctate bleeding bone was encountered. A 56 mm Pinnacle 100 acetabular component was positioned and impacted into place. Good scratch fit was appreciated. A +4 mm neutral polyethylene trial was inserted.  Attention was then directed to the proximal femur.  Femoral broaches were inserted in a sequential fashion up to a size 7 broach. Calcar region was planed and a trial reduction was performed using a standard offset neck and a 36 mm hip ball with a +1.5 mm neck length.  There was felt be slight decrease in the hip offset so it was elected to trial with  the high offset neck segment with the 36 mm hip ball and a +1.5 mm neck length.  Good equalization of limb  lengths and hip offset was appreciated and excellent stability was noted both anteriorly and posteriorly. Trial components were removed. The acetabular shell was irrigated with copious amounts of normal saline with antibiotic solution and suctioned dry. A +4 mm neutral Pinnacle Marathon polyethylene insert was positioned and impacted into place. Next, a size 7 high offset Actis femoral stem was positioned and impacted into place. Excellent scratch fit was appreciated. A trial reduction was again performed with a 36 mm hip ball with a +1.5 mm neck length. Again, good equalization of limb lengths was appreciated and excellent stability appreciated both anteriorly and posteriorly. The hip was then dislocated and the trial hip ball was removed. The Morse taper was cleaned and dried. A 36 mm M-SPEC hip ball with a +1.5 mm neck length was placed on the trunnion and impacted into place. The hip was then reduced and placed through range of motion. Excellent stability was appreciated both anteriorly and posteriorly.  The wound was irrigated with copious amounts of normal saline followed by 450 ml of Surgiphor and suctioned dry. Good hemostasis was appreciated. The posterior capsulotomy was repaired using #5 Ethibond. Piriformis tendon was reapproximated to the undersurface of the gluteus medius tendon using #5 Ethibond. The IT band was reapproximated using interrupted sutures of #1 Vicryl. Subcutaneous tissue was approximated using first #0 Vicryl followed by #2-0 Vicryl. The skin was closed with skin staples.  The patient tolerated the procedure well and was transported to the recovery room in stable condition.   Jena Gauss., M.D.

## 2023-07-06 NOTE — [place] FL2 (Addendum)
 [place] MEDICAID FL2 LEVEL OF CARE FORM     IDENTIFICATION  Patient Name: Bridget Deleon Birthdate: 04-15-1941 Sex: female Admission Date (Current Location): 07/06/2023  [facility] and [place] Number:  Chiropodist and Address:  [facility], [address] [place], [place] [address]      Provider Number: [id number]  Attending Physician Name and Address:  Donato Heinz, MD  Relative Name and Phone Number:  [name], son, [phone]    Current Level of Care: Hospital Recommended Level of Care: Skilled Nursing Facility Prior Approval Number:    Date Approved/Denied:   PASRR Number:  [id number]   Discharge Plan: SNF    Current Diagnoses: Patient Active Problem List   Diagnosis Date Noted   Hx of total hip arthroplasty, right 07/06/2023   Eczema 07/05/2023   Primary osteoarthritis of right hip 05/29/2023   GERD (gastroesophageal reflux disease) 08/19/2022   Osteoporosis 08/19/2022   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   Influenza A 04/19/2022   GAD (generalized anxiety disorder) 11/11/2021   Loss of memory 11/11/2021   SOBOE (shortness of breath on exertion) 08/18/2020   Benign essential HTN 06/18/2020   Hyperlipidemia, mixed 06/18/2020   Abnormal ECG 06/18/2020   History of lung cancer 08/24/2019   Hives 10/13/2018   Low serum vitamin D 07/31/2015   Anxiety 11/24/2012   Hypertension 11/24/2012   Irritable bowel syndrome, unspecified 11/24/2012    Orientation RESPIRATION BLADDER Height & Weight     Self, Time, Situation, Place  Normal Continent Weight: 132 lb 9.6 oz (60.1 kg) Height:  5\' 9"  (175.3 cm)  BEHAVIORAL SYMPTOMS/MOOD NEUROLOGICAL BOWEL NUTRITION STATUS     (n/a) Continent Diet (normal)  AMBULATORY STATUS COMMUNICATION OF NEEDS Skin       Surgical wounds (Surgical incision/dressing in place)                       Personal Care Assistance Level of Assistance  Bathing, Dressing, Feeding Bathing  Assistance: Limited assistance Feeding assistance: Independent Dressing Assistance: Limited assistance     Functional Limitations Info  Sight, Hearing, Speech Sight Info: Adequate Hearing Info: Adequate Speech Info: Adequate    SPECIAL CARE FACTORS FREQUENCY  PT (By licensed PT), OT (By licensed OT)     PT Frequency: 5 times per week OT Frequency: 5 times per week            Contractures Contractures Info: Not present    Additional Factors Info  Code Status, Allergies Code Status Info: full Allergies Info: Iron, Penicillins, Penicillins, Calamine, Codeine, Vicodin (Hydrocodone-acetaminophen           Current Medications (07/06/2023):  This is the current hospital active medication list Current Facility-Administered Medications  Medication Dose Route Frequency Provider Last Rate Last Admin   0.9 %  sodium chloride infusion   Intravenous Continuous Hooten, Illene Labrador, MD 100 mL/hr at 07/06/23 1216 New Bag at 07/06/23 1216   acetaminophen (OFIRMEV) IV 1,000 mg  1,000 mg Intravenous Q6H Hooten, Illene Labrador, MD       acetaminophen (TYLENOL) tablet 325-650 mg  325-650 mg Oral Q6H PRN Hooten, Illene Labrador, MD       ALPRAZolam Prudy Feeler) tablet 0.25 mg  0.25 mg Oral TID PRN Hooten, Illene Labrador, MD       alum & mag hydroxide-simeth (MAALOX/MYLANTA) 200-200-20 MG/5ML suspension 30 mL  30 mL Oral Q4H PRN Hooten, Illene Labrador, MD       [START ON 07/07/2023] aspirin  chewable tablet 81 mg  81 mg Oral BID Hooten, Illene Labrador, MD       bisacodyl (DULCOLAX) suppository 10 mg  10 mg Rectal Daily PRN Hooten, Illene Labrador, MD       ceFAZolin (ANCEF) IVPB 2g/100 mL premix  2 g Intravenous Q6H Hooten, Illene Labrador, MD   Stopped at 07/06/23 1517   celecoxib (CELEBREX) capsule 200 mg  200 mg Oral BID Hooten, Illene Labrador, MD       dicyclomine (BENTYL) tablet 20 mg  20 mg Oral TID PRN Hooten, Illene Labrador, MD       diphenhydrAMINE (BENADRYL) 12.5 MG/5ML elixir 12.5-25 mg  12.5-25 mg Oral Q4H PRN Hooten, Illene Labrador, MD       fluticasone (FLONASE)  50 MCG/ACT nasal spray 1 spray  1 spray Each Nare Daily PRN Hooten, Illene Labrador, MD       fluticasone furoate-vilanterol (BREO ELLIPTA) 100-25 MCG/ACT 1 puff  1 puff Inhalation Daily Hooten, Illene Labrador, MD       And   umeclidinium bromide (INCRUSE ELLIPTA) 62.5 MCG/ACT 1 puff  1 puff Inhalation Daily Hooten, Illene Labrador, MD       HYDROmorphone (DILAUDID) injection 0.5-1 mg  0.5-1 mg Intravenous Q4H PRN Hooten, Illene Labrador, MD       losartan (COZAAR) tablet 50 mg  50 mg Oral Daily Hooten, Illene Labrador, MD       magnesium hydroxide (MILK OF MAGNESIA) suspension 30 mL  30 mL Oral Daily Hooten, Illene Labrador, MD       menthol-cetylpyridinium (CEPACOL) lozenge 3 mg  1 lozenge Oral PRN Hooten, Illene Labrador, MD       Or   phenol (CHLORASEPTIC) mouth spray 1 spray  1 spray Mouth/Throat PRN Hooten, Illene Labrador, MD       methocarbamol (ROBAXIN) tablet 500 mg  500 mg Oral TID PRN Hooten, Illene Labrador, MD       metoCLOPramide (REGLAN) tablet 10 mg  10 mg Oral TID AC & HS Hooten, Illene Labrador, MD       [START ON 07/07/2023] NIFEdipine (PROCARDIA-XL/NIFEDICAL-XL) 24 hr tablet 30 mg  30 mg Oral Daily Hooten, Illene Labrador, MD       ondansetron (ZOFRAN) tablet 4 mg  4 mg Oral Q6H PRN Hooten, Illene Labrador, MD       Or   ondansetron (ZOFRAN) injection 4 mg  4 mg Intravenous Q6H PRN Hooten, Illene Labrador, MD   4 mg at 07/06/23 1532   oxyCODONE (Oxy IR/ROXICODONE) immediate release tablet 10 mg  10 mg Oral Q4H PRN Hooten, Illene Labrador, MD       oxyCODONE (Oxy IR/ROXICODONE) immediate release tablet 5 mg  5 mg Oral Q4H PRN Hooten, Illene Labrador, MD   5 mg at 07/06/23 1420   pantoprazole (PROTONIX) EC tablet 40 mg  40 mg Oral BID Donato Heinz, MD   40 mg at 07/06/23 1421   senna-docusate (Senokot-S) tablet 1 tablet  1 tablet Oral BID Donato Heinz, MD   1 tablet at 07/06/23 1421   [START ON 07/07/2023] sertraline (ZOLOFT) tablet 50 mg  50 mg Oral Daily Hooten, Illene Labrador, MD       sodium phosphate (FLEET) enema 1 enema  1 enema Rectal Once PRN Hooten, Illene Labrador, MD       Melene Muller ON  07/07/2023] torsemide [facility]) tablet 20 mg  20 mg Oral Once per day on Sunday Tuesday Thursday Saturday Hooten, Illene Labrador, MD  traMADol (ULTRAM) tablet 50-100 mg  50-100 mg Oral Q4H PRN Hooten, Illene Labrador, MD         Discharge Medications: Please see discharge summary for a list of discharge medications.  Relevant Imaging Results:  Relevant Lab Results:   Additional Information ss#: [id number]  [place] Jaimes Eckert, LCSW

## 2023-07-06 NOTE — TOC Progression Note (Signed)
 Transition of Care [facility]) - Progression Note    Patient Details  Name: Bridget Deleon MRN: [id number] Date of Birth: [birth year]  [facility] of [facility]) CM/SW Contact  Marlowe Sax, RN Phone Number: 07/06/2023, 4:32 PM  Clinical Narrative:    Met with the patient and her family in the room, they are agreeable to go to STR and would prefer [facility] if able to go there        Expected Discharge Plan and Services                                               Social Determinants of Health (SDOH) Interventions SDOH Screenings   Food Insecurity: No Food Insecurity (07/06/2023)  Housing: Low Risk  (07/06/2023)  Transportation Needs: No Transportation Needs (07/06/2023)  Utilities: Not At Risk (07/06/2023)  Depression (PHQ2-9): Low Risk  (09/20/2022)  Recent Concern: Depression (PHQ2-9) - High Risk (08/19/2022)  Financial Resource Strain: Low Risk  (05/09/2023)   Received from [facility]  Social Connections: Moderately Isolated (07/06/2023)  Tobacco Use: Medium Risk (07/06/2023)    Readmission Risk Interventions     No data to display

## 2023-07-06 NOTE — Progress Notes (Signed)
 Patient is not able to walk the distance required to go the bathroom, or he/she is unable to safely negotiate stairs required to access the bathroom.  A 3in1 BSC will alleviate this problem   Amenda Duclos P. Angie Fava M.D.

## 2023-07-07 ENCOUNTER — Encounter: Payer: Self-pay | Admitting: Orthopedic Surgery

## 2023-07-07 DIAGNOSIS — M1611 Unilateral primary osteoarthritis, right hip: Secondary | ICD-10-CM | POA: Diagnosis not present

## 2023-07-07 MED ORDER — CELECOXIB 200 MG PO CAPS: 200.0000 mg | ORAL_CAPSULE | Freq: Two times a day (BID) | ORAL | 0 refills | Status: AC

## 2023-07-07 MED ORDER — OXYCODONE HCL 5 MG PO TABS: 5.0000 mg | ORAL_TABLET | ORAL | 0 refills | Status: AC | PRN

## 2023-07-07 MED ORDER — ORAL CARE MOUTH RINSE: 15.0000 mL | OROMUCOSAL | Status: DC | PRN

## 2023-07-07 MED ORDER — TRAMADOL HCL 50 MG PO TABS: 50.0000 mg | ORAL_TABLET | Freq: Four times a day (QID) | ORAL | 0 refills | Status: AC | PRN

## 2023-07-07 MED ORDER — ASPIRIN 81 MG PO CHEW: 81.0000 mg | CHEWABLE_TABLET | Freq: Two times a day (BID) | ORAL | Status: AC

## 2023-07-07 MED ORDER — SODIUM CHLORIDE 0.9 % IV BOLUS
500.0000 mL | Freq: Once | INTRAVENOUS | Status: AC
  Administered 2023-07-07: 500 mL via INTRAVENOUS

## 2023-07-07 NOTE — Progress Notes (Signed)
 Pts BP 91/43, pt asymptomatic. MD Hooten notified, orders received for NS 500 ml bolus. Order placed.

## 2023-07-07 NOTE — Plan of Care (Signed)
   Problem: Activity: Goal: Ability to avoid complications of mobility impairment will improve Outcome: Progressing   Problem: Pain Management: Goal: Pain level will decrease with appropriate interventions Outcome: Progressing   Problem: Skin Integrity: Goal: Will show signs of wound healing Outcome: Progressing

## 2023-07-07 NOTE — Progress Notes (Signed)
 Physical Therapy Treatment Patient Details Name: Bridget Deleon MRN: [id number] DOB: 10-23-1940 Today's Date: 07/07/2023   History of Present Illness Patient is an 83 year old female s/p R THA. Posterior hip precautions.    PT Comments  Patient received with RN in room. Had just gotten back to bed. Patient is agreeable to PT session. She is mod I with bed mobility, although continues to require cues for posterior hip precaution. 0/3 recall from yesterday. She stands with cga and cues. Ambulated 30 feet with RW and cga. Cues needed for sequencing. She will continue to benefit from skilled PT to improve independence and safety.      If plan is discharge home, recommend the following: A lot of help with walking and/or transfers;A lot of help with bathing/dressing/bathroom;Assist for transportation;Help with stairs or ramp for entrance;Assistance with cooking/housework   Can travel by private vehicle     Yes  Equipment Recommendations  Rolling walker (2 wheels)    Recommendations for Other Services       Precautions / Restrictions Precautions Precautions: Fall;Posterior Hip Precaution Booklet Issued: No Recall of Precautions/Restrictions: Impaired Precaution/Restrictions Comments: no recall of hip precautions Restrictions Weight Bearing Restrictions Per Provider Order: Yes RLE Weight Bearing Per Provider Order: Weight bearing as tolerated     Mobility  Bed Mobility Overal bed mobility: Needs Assistance Bed Mobility: Supine to Sit, Sit to Supine     Supine to sit: Supervision Sit to supine: Supervision   General bed mobility comments: supervision this session    Transfers Overall transfer level: Needs assistance Equipment used: Rolling walker (2 wheels) Transfers: Sit to/from Stand Sit to Stand: Contact guard assist           General transfer comment: Cues for weight bearing and precautions, hand placement    Ambulation/Gait Ambulation/Gait assistance:  Contact guard assist Gait Distance (Feet): 30 Feet Assistive device: Rolling walker (2 wheels) Gait Pattern/deviations: Step-to pattern, Decreased step length - right, Decreased step length - left, Decreased stride length Gait velocity: decreased         Stairs             Wheelchair Mobility     Tilt Bed    Modified Rankin (Stroke Patients Only)       Balance Overall balance assessment: Needs assistance Sitting-balance support: Feet supported Sitting balance-Leahy Scale: Good     Standing balance support: Bilateral upper extremity supported, During functional activity, Reliant on assistive device for balance Standing balance-Leahy Scale: Good                              Communication Communication Communication: No apparent difficulties  Cognition Arousal: Alert Behavior During Therapy: WFL for tasks assessed/performed   PT - Cognitive impairments: No apparent impairments                         Following commands: Intact      Cueing Cueing Techniques: Verbal cues  Exercises Total Joint Exercises Ankle Circles/Pumps: AROM, Right, 10 reps Quad Sets: AROM, Right, 10 reps Heel Slides: AROM, Right, 10 reps Hip ABduction/ADduction: AAROM, Right, 10 reps    General Comments        Pertinent Vitals/Pain Pain Assessment Pain Assessment: Faces Faces Pain Scale: Hurts little more Pain Location: R groin Pain Descriptors / Indicators: Discomfort, Grimacing, Guarding Pain Intervention(s): Monitored during session, Repositioned, Ice applied    Home Living  Prior Function            PT Goals (current goals can now be found in the care plan section) Acute Rehab PT Goals Patient Stated Goal: to go to rehab then home, son in law she lives with works during the day PT Goal Formulation: With patient Time For Goal Achievement: 07/13/23 Potential to Achieve Goals: Good Progress towards PT goals:  Progressing toward goals    Frequency    BID      PT Plan      Co-evaluation              AM-PAC PT "6 Clicks" Mobility   Outcome Measure  Help needed turning from your back to your side while in a flat bed without using bedrails?: A Little Help needed moving from lying on your back to sitting on the side of a flat bed without using bedrails?: A Little Help needed moving to and from a bed to a chair (including a wheelchair)?: A Little Help needed standing up from a chair using your arms (e.g., wheelchair or bedside chair)?: A Little Help needed to walk in hospital room?: A Little Help needed climbing 3-5 steps with a railing? : A Lot 6 Click Score: 17    End of Session Equipment Utilized During Treatment: Gait belt Activity Tolerance: Patient limited by pain;Patient limited by fatigue Patient left: in bed;with call bell/phone within reach;with bed alarm set Nurse Communication: Mobility status PT Visit Diagnosis: Other abnormalities of gait and mobility (R26.89);Muscle weakness (generalized) (M62.81);Difficulty in walking, not elsewhere classified (R26.2);Pain Pain - Right/Left: Right Pain - part of body: Hand     Time: 0850-0910 PT Time Calculation (min) (ACUTE ONLY): 20 min  Charges:    $Gait Training: 8-22 mins PT General Charges $$ ACUTE PT VISIT: 1 Visit                     Jayliana Valencia, PT, GCS 07/07/23,10:26 AM

## 2023-07-07 NOTE — TOC Progression Note (Signed)
 Transition of Care [facility]) - Progression Note    Patient Details  Name: Bridget Deleon MRN: [id number] Date of Birth: 1941/04/04  Transition [facility]) CM/SW Contact  Marlowe Sax, RN Phone Number: 07/07/2023, 3:59 PM  Clinical Narrative:           Pending  Plan Auth ID:  [id number] Expected Discharge Plan and Services                                               Social Determinants of Health (SDOH) Interventions SDOH Screenings   Food Insecurity: No Food Insecurity (07/06/2023)  Housing: Low Risk  (07/06/2023)  Transportation Needs: No Transportation Needs (07/06/2023)  Utilities: Not At Risk (07/06/2023)  Depression (PHQ2-9): Low Risk  (09/20/2022)  Recent Concern: Depression (PHQ2-9) - High Risk (08/19/2022)  Financial Resource Strain: Low Risk  (05/09/2023)   Received from [facility]  Social Connections: Moderately Isolated (07/06/2023)  Tobacco Use: Medium Risk (07/06/2023)    Readmission Risk Interventions     No data to display

## 2023-07-07 NOTE — Progress Notes (Addendum)
  Subjective: 1 Day Post-Op Procedure(s) (LRB): ARTHROPLASTY, HIP, TOTAL,POSTERIOR APPROACH (Right) Patient reports pain as mild.   Patient is well, and has had no acute complaints or problems Plan is to go to rehab after hospital stay. Negative for chest pain and shortness of breath Fever: no Gastrointestinal: Negative for nausea and vomiting  Objective: Vital signs in last 24 hours: Temp:  [97 F (36.1 C)-98.8 F (37.1 C)] 98.1 F (36.7 C) (03/20 0310) Pulse Rate:  [66-158] 81 (03/20 0550) Resp:  [14-22] 16 (03/20 0550) BP: (90-116)/(40-73) 102/46 (03/20 0550) SpO2:  [90 %-100 %] 96 % (03/20 0550)  Intake/Output from previous day:  Intake/Output Summary (Last 24 hours) at 07/07/2023 0707 Last data filed at 07/07/2023 0311 Gross per 24 hour  Intake 2102.66 ml  Output 390 ml  Net 1712.66 ml    Intake/Output this shift: No intake/output data recorded.  Labs: No results for input(s): "HGB" in the last 72 hours. No results for input(s): "WBC", "RBC", "HCT", "PLT" in the last 72 hours. No results for input(s): "NA", "K", "CL", "CO2", "BUN", "CREATININE", "GLUCOSE", "CALCIUM" in the last 72 hours. No results for input(s): "LABPT", "INR" in the last 72 hours.   EXAM General - Patient is Alert and Oriented Extremity - Neurovascular intact Sensation intact distally Compartment soft Dressing/Incision - clean, dry, with the Hemovac tubing removed with no complication.  The Hemovac tubing was intact on removal. Motor Function - intact, moving foot and toes well on exam.  Ambulated 3 feet with physical therapy  Past Medical History:  Diagnosis Date   Arrhythmia    Arthritis    osteoarthritis   Cancer (HCC) 2010   right lower lobe cancer    Complication of anesthesia 23 yrs ago .    woke up on operating table, also A-fib for short time after Lung surgery   COPD (chronic obstructive pulmonary disease) (HCC)    Coronary artery disease 2010   woke up and states she was told   bottom of heart was asleep ... takes diltiazem for that reason .   GERD (gastroesophageal reflux disease)    Hypercholesterolemia    Ovarian ca (HCC) 1972   Wears dentures    full upper and lower    Assessment/Plan: 1 Day Post-Op Procedure(s) (LRB): ARTHROPLASTY, HIP, TOTAL,POSTERIOR APPROACH (Right) Principal Problem:   Hx of total hip arthroplasty, right  Estimated body mass index is 19.58 kg/m as calculated from the following:   Height as of this encounter: 5\' 9"  (1.753 m).   Weight as of this encounter: 60.1 kg. Advance diet Up with therapy D/C IV fluids  Discharge planning: Plan to discharge to rehab.  Follow-up with [facility]ics in 6 weeks for x-rays and evaluation with Dr. Ernest Pine.  Staples to be removed in 2 weeks while in rehab.  DVT Prophylaxis - Aspirin, Foot Pumps, and TED hose Weight-Bearing as tolerated to right leg  Dedra Skeens, PA-C Orthopaedic Surgery 07/07/2023, 7:07 AM

## 2023-07-07 NOTE — Evaluation (Signed)
 Occupational Therapy Evaluation Patient Details Name: Bridget Deleon MRN: [id number] DOB: Nov 17, 1940 Today's Date: 07/07/2023   History of Present Illness   Patient is an 83 year old female s/p R THA. Posterior hip precautions.     Clinical Impressions Patient presenting with decreased Ind in self care, balance, functional mobility/transfers, endurance, and safety awareness. Patient reports being Ind at baseline and living with son in law. He works during the day. Pt endorses 8/10 pain in groin during session. Min A for bed mobility and mod A to stand from bed this session. Pt needing increased time and cuing to fully place R foot onto floor during transfers and ambulation in room with RW. Pt was unable to verbalize posterior hip precautions this session and they were reviewed once more. Patient will benefit from acute OT to increase overall independence in the areas of ADLs, functional mobility, and safety awareness in order to safely discharge.     If plan is discharge home, recommend the following:   A little help with walking and/or transfers;A little help with bathing/dressing/bathroom;Assistance with cooking/housework;Assist for transportation;Help with stairs or ramp for entrance     Functional Status Assessment   Patient has had a recent decline in their functional status and demonstrates the ability to make significant improvements in function in a reasonable and predictable amount of time.     Equipment Recommendations   BSC/3in1      Precautions/Restrictions   Precautions Precautions: Fall;Posterior Hip Precaution Booklet Issued: No Recall of Precautions/Restrictions: Impaired Precaution/Restrictions Comments: no recall of hip precautions Restrictions Weight Bearing Restrictions Per Provider Order: Yes RLE Weight Bearing Per Provider Order: Weight bearing as tolerated     Mobility Bed Mobility Overal bed mobility: Needs Assistance Bed Mobility:  Supine to Sit, Sit to Supine     Supine to sit: Min assist Sit to supine: Min assist        Transfers Overall transfer level: Needs assistance Equipment used: Rolling walker (2 wheels) Transfers: Sit to/from Stand Sit to Stand: Min assist, Mod assist                  Balance Overall balance assessment: Needs assistance Sitting-balance support: Feet supported Sitting balance-Leahy Scale: Good     Standing balance support: Bilateral upper extremity supported, During functional activity, Reliant on assistive device for balance Standing balance-Leahy Scale: Good                             ADL either performed or assessed with clinical judgement   ADL Overall ADL's : Needs assistance/impaired                                       General ADL Comments: mod A for simulated toilet transfer sit <>stand. Pt would likely need mod - max A for LB self care.     Vision Patient Visual Report: No change from baseline              Pertinent Vitals/Pain Pain Assessment Pain Assessment: 0-10 Pain Score: 8  Faces Pain Scale: Hurts little more Pain Location: R groin Pain Descriptors / Indicators: Discomfort, Grimacing, Guarding Pain Intervention(s): Limited activity within patient's tolerance, Monitored during session, Repositioned     Extremity/Trunk Assessment Upper Extremity Assessment Upper Extremity Assessment: Overall WFL for tasks assessed   Lower Extremity Assessment Lower Extremity Assessment: RLE  deficits/detail RLE: Unable to fully assess due to pain RLE Coordination: decreased gross motor   Cervical / Trunk Assessment Cervical / Trunk Assessment: Normal   Communication Communication Communication: No apparent difficulties   Cognition Arousal: Alert Behavior During Therapy: WFL for tasks assessed/performed                                 Following commands: Intact       Cueing  General Comments    Cueing Techniques: Verbal cues              Home Living Family/patient expects to be discharged to:: Skilled nursing facility Living Arrangements: Other relatives                                      Prior Functioning/Environment Prior Level of Function : Independent/Modified Independent             Mobility Comments: Uses a rollator at baseline      OT Problem List: Decreased strength;Decreased activity tolerance;Decreased safety awareness;Impaired balance (sitting and/or standing);Decreased knowledge of use of DME or AE;Decreased knowledge of precautions;Pain   OT Treatment/Interventions: Self-care/ADL training;Therapeutic exercise;Therapeutic activities;Energy conservation;DME and/or AE instruction;Patient/family education;Balance training      OT Goals(Current goals can be found in the care plan section)   Acute Rehab OT Goals Patient Stated Goal: to decrease pain OT Goal Formulation: With patient Time For Goal Achievement: 07/21/23 Potential to Achieve Goals: Fair ADL Goals Pt Will Perform Grooming: with supervision;standing Pt Will Perform Lower Body Dressing: with contact guard assist;sit to/from stand;with adaptive equipment Pt Will Transfer to Toilet: with supervision;ambulating Pt Will Perform Toileting - Clothing Manipulation and hygiene: with supervision;sit to/from stand   OT Frequency:  Min 2X/week       AM-PAC OT "6 Clicks" Daily Activity     Outcome Measure Help from another person eating meals?: None Help from another person taking care of personal grooming?: None Help from another person toileting, which includes using toliet, bedpan, or urinal?: A Lot Help from another person bathing (including washing, rinsing, drying)?: A Lot Help from another person to put on and taking off regular upper body clothing?: A Little Help from another person to put on and taking off regular lower body clothing?: A Lot 6 Click Score: 17   End of  Session Equipment Utilized During Treatment: Rolling walker (2 wheels) Nurse Communication: Mobility status  Activity Tolerance: Patient limited by pain Patient left: in bed;with call bell/phone within reach  OT Visit Diagnosis: Unsteadiness on feet (R26.81);Repeated falls (R29.6)                Time: 1610-9604 OT Time Calculation (min): 21 min Charges:  OT General Charges $OT Visit: 1 Visit OT Evaluation $OT Eval Moderate Complexity: 1 Mod OT Treatments $Therapeutic Activity: 8-22 mins Jackquline Denmark, MS, OTR/L , CBIS ascom [phone]  07/07/23, 12:00 PM

## 2023-07-07 NOTE — Progress Notes (Signed)
 Physical Therapy Treatment Patient Details Name: Bridget Deleon MRN: [id number] DOB: February 08, 1941 Today's Date: 07/07/2023   History of Present Illness Patient is an 83 year old female s/p R THA. Posterior hip precautions.    PT Comments  Patient received in bed sleeping, wakes to my voice. She is agreeable to PT session. Patient reports continued pain in right groin area. She required cga for bed mobility and cga for sit to stand with cues for hand placement. Patient ambulated 60 feet with RW and cga. Cues for safe use of AD and sequencing. She requires continued cues regarding hip precautions with 1/3 recall ( no crossing legs). She is making steady progress toward goals. Patient will continue to benefit from skilled PT to improve strength, activity tolerance and safety with mobility.        If plan is discharge home, recommend the following: A lot of help with bathing/dressing/bathroom;Assist for transportation;Help with stairs or ramp for entrance;Assistance with cooking/housework;A little help with walking and/or transfers   Can travel by private vehicle     Yes  Equipment Recommendations  Rolling walker (2 wheels)    Recommendations for Other Services       Precautions / Restrictions Precautions Precautions: Fall;Posterior Hip Precaution Booklet Issued: Yes (comment) Recall of Precautions/Restrictions: Impaired Precaution/Restrictions Comments: 1/3 recall of hip precautions Restrictions Weight Bearing Restrictions Per Provider Order: Yes RLE Weight Bearing Per Provider Order: Weight bearing as tolerated     Mobility  Bed Mobility Overal bed mobility: Needs Assistance Bed Mobility: Supine to Sit, Sit to Supine     Supine to sit: Supervision Sit to supine: Min assist   General bed mobility comments: Min assist to get R LE back up onto bed    Transfers Overall transfer level: Needs assistance Equipment used: Rolling walker (2 wheels) Transfers: Sit to/from  Stand Sit to Stand: Contact guard assist           General transfer comment: Cues for weight bearing and precautions, hand placement    Ambulation/Gait Ambulation/Gait assistance: Contact guard assist Gait Distance (Feet): 50 Feet Assistive device: Rolling walker (2 wheels) Gait Pattern/deviations: Step-to pattern, Decreased step length - right, Decreased step length - left, Decreased weight shift to right, Decreased stride length Gait velocity: decreased     General Gait Details: Ambulating out in hallway, pain limited, cues needed for safe use of AD   Stairs             Wheelchair Mobility     Tilt Bed    Modified Rankin (Stroke Patients Only)       Balance Overall balance assessment: Needs assistance Sitting-balance support: Feet supported Sitting balance-Leahy Scale: Good     Standing balance support: Bilateral upper extremity supported, During functional activity, Reliant on assistive device for balance Standing balance-Leahy Scale: Good                              Communication Communication Communication: No apparent difficulties  Cognition Arousal: Alert Behavior During Therapy: WFL for tasks assessed/performed   PT - Cognitive impairments: Memory, Safety/Judgement                         Following commands: Intact      Cueing Cueing Techniques: Verbal cues  Exercises Total Joint Exercises Ankle Circles/Pumps: AROM, Right, 10 reps Quad Sets: AROM, Right, 10 reps Gluteal Sets: AROM, Right, 10 reps Towel Squeeze: AROM,  Right, 5 reps Short Arc Quad: AROM, Right, 5 reps Heel Slides: AROM, Right, 10 reps Hip ABduction/ADduction: AAROM, Right, 10 reps    General Comments        Pertinent Vitals/Pain Pain Assessment Pain Assessment: Faces Faces Pain Scale: Hurts little more Pain Location: R groin Pain Descriptors / Indicators: Discomfort, Grimacing, Guarding Pain Intervention(s): Monitored during session,  Repositioned, Ice applied    Home Living Family/patient expects to be discharged to:: Skilled nursing facility Living Arrangements: Other relatives                      Prior Function            PT Goals (current goals can now be found in the care plan section) Acute Rehab PT Goals Patient Stated Goal: to go to rehab then home, son in law she lives with works during the day PT Goal Formulation: With patient Time For Goal Achievement: 07/13/23 Potential to Achieve Goals: Good Progress towards PT goals: Progressing toward goals    Frequency    BID      PT Plan      Co-evaluation              AM-PAC PT "6 Clicks" Mobility   Outcome Measure  Help needed turning from your back to your side while in a flat bed without using bedrails?: A Little Help needed moving from lying on your back to sitting on the side of a flat bed without using bedrails?: A Little Help needed moving to and from a bed to a chair (including a wheelchair)?: A Little Help needed standing up from a chair using your arms (e.g., wheelchair or bedside chair)?: A Little Help needed to walk in hospital room?: A Little Help needed climbing 3-5 steps with a railing? : A Lot 6 Click Score: 17    End of Session Equipment Utilized During Treatment: Gait belt Activity Tolerance: Patient limited by fatigue;Patient limited by pain Patient left: in bed;with call bell/phone within reach;with bed alarm set Nurse Communication: Mobility status PT Visit Diagnosis: Other abnormalities of gait and mobility (R26.89);Muscle weakness (generalized) (M62.81);Difficulty in walking, not elsewhere classified (R26.2);Pain Pain - Right/Left: Right Pain - part of body: Hip     Time: 9562-1308 PT Time Calculation (min) (ACUTE ONLY): 23 min  Charges:    $Gait Training: 8-22 mins $Therapeutic Exercise: 8-22 mins PT General Charges $$ ACUTE PT VISIT: 1 Visit                     Jaeleen Inzunza, PT,  GCS 07/07/23,2:30 PM

## 2023-07-07 NOTE — TOC Progression Note (Signed)
 Transition of Care [facility]) - Progression Note    Patient Details  Name: Bridget Deleon MRN: [id number] Date of Birth: [birth year]  [facility]) CM/SW Contact  Marlowe Sax, RN Phone Number: 07/07/2023, 2:01 PM  Clinical Narrative:     Met with the patient and her family in the room, I reviewed the bed offers, they are going to go and visit some of the facilities and will call me and let me know their choice       Expected Discharge Plan and Services                                               Social Determinants of Health (SDOH) Interventions SDOH Screenings   Food Insecurity: No Food Insecurity (07/06/2023)  Housing: Low Risk  (07/06/2023)  Transportation Needs: No Transportation Needs (07/06/2023)  Utilities: Not At Risk (07/06/2023)  Depression (PHQ2-9): Low Risk  (09/20/2022)  Recent Concern: Depression (PHQ2-9) - High Risk (08/19/2022)  Financial Resource Strain: Low Risk  (05/09/2023)   Received from [facility]  Social Connections: Moderately Isolated (07/06/2023)  Tobacco Use: Medium Risk (07/06/2023)    Readmission Risk Interventions     No data to display

## 2023-07-07 NOTE — Care Management Obs Status (Signed)
 MEDICARE OBSERVATION STATUS NOTIFICATION   Patient Details  Name: Bridget Deleon MRN: [id number] Date of Birth: 18-Dec-1940   Medicare Observation Status Notification Given:  Orland Dec, CMA 07/07/2023, 11:25 AM

## 2023-07-07 NOTE — TOC Progression Note (Signed)
 Transition of Care [facility]) - Progression Note    Patient Details  Name: Bridget Deleon MRN: [id number] Date of Birth: Dec 22, 1940  Transition of Care [facility]) CM/SW Contact  Marlowe Sax, RN Phone Number: 07/07/2023, 3:37 PM  Clinical Narrative:     Received a message from family after they toured the facilities they chose [facility], I notified [facility] and ins Berkley Harvey is pending to go to [employer]       Expected Discharge Plan and Services                                               Social Determinants of Health (SDOH) Interventions SDOH Screenings   Food Insecurity: No Food Insecurity (07/06/2023)  Housing: Low Risk  (07/06/2023)  Transportation Needs: No Transportation Needs (07/06/2023)  Utilities: Not At Risk (07/06/2023)  Depression (PHQ2-9): Low Risk  (09/20/2022)  Recent Concern: Depression (PHQ2-9) - High Risk (08/19/2022)  Financial Resource Strain: Low Risk  (05/09/2023)   Received from [facility]  Social Connections: Moderately Isolated (07/06/2023)  Tobacco Use: Medium Risk (07/06/2023)    Readmission Risk Interventions     No data to display

## 2023-07-07 NOTE — Anesthesia Postprocedure Evaluation (Signed)
 Anesthesia Post Note  Patient: Bridget Deleon  Procedure(s) Performed: ARTHROPLASTY, HIP, TOTAL,POSTERIOR APPROACH (Right: Hip)  Patient location during evaluation: Nursing Unit Anesthesia Type: Spinal Level of consciousness: oriented and awake and alert Pain management: pain level controlled Vital Signs Assessment: post-procedure vital signs reviewed and stable Respiratory status: spontaneous breathing and respiratory function stable Cardiovascular status: blood pressure returned to baseline and stable Postop Assessment: no headache, no backache, no apparent nausea or vomiting and able to ambulate Anesthetic complications: no   No notable events documented.   Last Vitals:  Vitals:   07/07/23 0550 07/07/23 0715  BP: (!) 102/46 (!) 105/48  Pulse: 81 74  Resp: 16 16  Temp:  36.8 C  SpO2: 96% 98%    Last Pain:  Vitals:   07/07/23 0715  TempSrc: Oral  PainSc: 0-No pain                 Mathews Argyle P

## 2023-07-07 NOTE — Plan of Care (Signed)
  Problem: Activity: Goal: Ability to avoid complications of mobility impairment will improve Outcome: Progressing Goal: Ability to tolerate increased activity will improve Outcome: Progressing   Problem: Pain Management: Goal: Pain level will decrease with appropriate interventions Outcome: Progressing   

## 2023-07-08 DIAGNOSIS — M1611 Unilateral primary osteoarthritis, right hip: Secondary | ICD-10-CM | POA: Diagnosis not present

## 2023-07-08 LAB — SURGICAL PATHOLOGY

## 2023-07-08 NOTE — Progress Notes (Signed)
 Physical Therapy Treatment Patient Details Name: Bridget Deleon MRN: [id number] DOB: [birth year] Today's Date: 07/08/2023   History of Present Illness Patient is an 83 year old female s/p R THA. Posterior hip precautions.    PT Comments  Pt received in bed, pre-medicated prior to session. Good tolerance for R LE AROM prior to OOB. Pt required Supervision to transfer Supine>sit with use of rails. Sit<>stand Min/CGA and cues for proper technique and hip precautions. Pt only able to recall 1/3 precautions. Gait training with RW in hall with CGA ~64ft. Increased c/o dizziness as gait distance progressed requiring seated rest break to resolve dizziness. BP 93/44, HR 90. Nursing present to assess. Shorter distance gait back to room. Pt left sitting upright in chair with all needs in reach. Continue PT acutely, pt awaiting transfer to STR prior to returning home.   If plan is discharge home, recommend the following: A little help with walking and/or transfers;A lot of help with bathing/dressing/bathroom;Assistance with cooking/housework;Assist for transportation;Help with stairs or ramp for entrance   Can travel by private vehicle     Yes  Equipment Recommendations  Other (comment) (TBD at next level of care)    Recommendations for Other Services       Precautions / Restrictions Precautions Precautions: Fall;Posterior Hip Precaution Booklet Issued: Yes (comment) Recall of Precautions/Restrictions: Impaired Precaution/Restrictions Comments: 1/3 recall of hip precautions Restrictions Weight Bearing Restrictions Per Provider Order: Yes RLE Weight Bearing Per Provider Order: Weight bearing as tolerated     Mobility  Bed Mobility Overal bed mobility: Needs Assistance Bed Mobility: Supine to Sit     Supine to sit: Supervision, Used rails     General bed mobility comments: Without rails, pt would require at least MinA for bed mobility    Transfers Overall transfer level: Needs  assistance Equipment used: Rolling walker (2 wheels) Transfers: Sit to/from Stand Sit to Stand: Contact guard assist, Min assist           General transfer comment: Cues for weight bearing and precautions, hand placement    Ambulation/Gait Ambulation/Gait assistance: Contact guard assist Gait Distance (Feet): 60 Feet Assistive device: Rolling walker (2 wheels) Gait Pattern/deviations: Step-through pattern, Decreased step length - right, Decreased step length - left, Decreased stance time - right, Antalgic Gait velocity: decreased     General Gait Details: Improved gait sequencing and tolerance, c/o dizziness once up and ambulating requiring a seated rest break prior to ambulating back. BP 93/44, nursing notified MD   Stairs             Wheelchair Mobility     Tilt Bed    Modified Rankin (Stroke Patients Only)       Balance Overall balance assessment: Needs assistance Sitting-balance support: Feet supported Sitting balance-Leahy Scale: Good     Standing balance support: Bilateral upper extremity supported, During functional activity, Reliant on assistive device for balance Standing balance-Leahy Scale: Fair Standing balance comment: "Fair" due to experiencing dizziness during ambulation                            Communication Communication Communication: No apparent difficulties  Cognition Arousal: Alert Behavior During Therapy: WFL for tasks assessed/performed   PT - Cognitive impairments: Memory, Safety/Judgement                       PT - Cognition Comments: Repeated cues to complete desired tasks Following commands: Intact  Cueing Cueing Techniques: Verbal cues  Exercises Total Joint Exercises Ankle Circles/Pumps: AROM, Both, 10 reps Heel Slides: AROM, Right, 5 reps, Supine Hip ABduction/ADduction: AAROM, Right, 5 reps, Supine General Exercises - Lower Extremity Long Arc Quad: AROM, Right, 10 reps, Seated     General Comments General comments (skin integrity, edema, etc.): R hip dressing dry and intact      Pertinent Vitals/Pain Pain Assessment Pain Assessment: 0-10 Pain Score: 2  Pain Location: R groin Pain Descriptors / Indicators: Discomfort, Aching Pain Intervention(s): Premedicated before session    Home Living                          Prior Function            PT Goals (current goals can now be found in the care plan section) Acute Rehab PT Goals Patient Stated Goal: to go to rehab then home, son in law she lives with works during the day Progress towards PT goals: Progressing toward goals    Frequency    BID      PT Plan      Co-evaluation              AM-PAC PT "6 Clicks" Mobility   Outcome Measure  Help needed turning from your back to your side while in a flat bed without using bedrails?: A Little Help needed moving from lying on your back to sitting on the side of a flat bed without using bedrails?: A Little Help needed moving to and from a bed to a chair (including a wheelchair)?: A Little Help needed standing up from a chair using your arms (e.g., wheelchair or bedside chair)?: A Little Help needed to walk in hospital room?: A Little Help needed climbing 3-5 steps with a railing? : A Lot 6 Click Score: 17    End of Session Equipment Utilized During Treatment: Gait belt Activity Tolerance: Other (comment) (Pt limited due to dizziness in standing) Patient left: in chair;with call bell/phone within reach;with nursing/sitter in room Nurse Communication: Mobility status;Other (comment) (dizziness with gait training) PT Visit Diagnosis: Other abnormalities of gait and mobility (R26.89);Muscle weakness (generalized) (M62.81);Difficulty in walking, not elsewhere classified (R26.2);Pain Pain - Right/Left: Right Pain - part of body: Hip     Time: 4401-0272 PT Time Calculation (min) (ACUTE ONLY): 28 min  Charges:    $Gait Training: 8-22  mins $Therapeutic Exercise: 8-22 mins PT General Charges $$ ACUTE PT VISIT: 1 Visit                    Zadie Cleverly, PTA  Jannet Askew 07/08/2023, 12:59 PM

## 2023-07-08 NOTE — Plan of Care (Signed)
  Problem: Clinical Measurements: Goal: Postoperative complications will be avoided or minimized Outcome: Progressing   

## 2023-07-08 NOTE — Progress Notes (Signed)
 Report given to Johnny Bridge at [facility].   Madie Reno, RN

## 2023-07-08 NOTE — Progress Notes (Signed)
  Subjective: 2 Days Post-Op Procedure(s) (LRB): ARTHROPLASTY, HIP, TOTAL,POSTERIOR APPROACH (Right) Patient reports pain as mild.   Patient is well, and has had no acute complaints or problems Plan is to go to rehab after hospital stay.  Awaiting insurance approval for [facility]. Negative for chest pain and shortness of breath Fever: no Gastrointestinal: Negative for nausea and vomiting  Objective: Vital signs in last 24 hours: Temp:  [98.2 F (36.8 C)-98.7 F (37.1 C)] 98.2 F (36.8 C) (03/20 2315) Pulse Rate:  [74-88] 88 (03/20 2315) Resp:  [16] 16 (03/20 2315) BP: (91-117)/(43-60) 117/53 (03/20 2315) SpO2:  [93 %-99 %] 99 % (03/20 2315)  Intake/Output from previous day:  Intake/Output Summary (Last 24 hours) at 07/08/2023 0609 Last data filed at 07/08/2023 0300 Gross per 24 hour  Intake 1550 ml  Output --  Net 1550 ml    Intake/Output this shift: Total I/O In: 165 [I.V.:165] Out: -   Labs: No results for input(s): "HGB" in the last 72 hours. No results for input(s): "WBC", "RBC", "HCT", "PLT" in the last 72 hours. No results for input(s): "NA", "K", "CL", "CO2", "BUN", "CREATININE", "GLUCOSE", "CALCIUM" in the last 72 hours. No results for input(s): "LABPT", "INR" in the last 72 hours.   EXAM General - Patient is Alert and Oriented Extremity - Neurovascular intact Sensation intact distally Compartment soft Dressing/Incision - clean, dry, with the Hemovac tubing removed with no complication.  The Hemovac tubing was intact on removal. Motor Function - intact, moving foot and toes well on exam.  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    woke up on operating table, also A-fib for short time after Lung surgery   COPD (chronic obstructive pulmonary disease) (HCC)    Coronary artery disease  2010   woke up and states she was told  bottom of heart was asleep ... takes diltiazem for that reason .   GERD (gastroesophageal reflux disease)    Hypercholesterolemia    Ovarian ca (HCC) 1972   Wears dentures    full upper and lower    Assessment/Plan: 2 Days Post-Op Procedure(s) (LRB): ARTHROPLASTY, HIP, TOTAL,POSTERIOR APPROACH (Right) Principal Problem:   Hx of total hip arthroplasty, right  Estimated body mass index is 19.58 kg/m as calculated from the following:   Height as of this encounter: 5\' 9"  (1.753 m).   Weight as of this encounter: 60.1 kg. Advance diet Up with therapy D/C IV fluids  Discharge planning: Plan to discharge to rehab.  Follow-up with [facility] clinic orthopedics in 6 weeks for x-rays and evaluation with Dr. Ernest Pine.  Staples to be removed in 2 weeks while in rehab.  DVT Prophylaxis - Aspirin, Foot Pumps, and TED hose Weight-Bearing as tolerated to right leg  Dedra Skeens, PA-C Orthopaedic Surgery 07/08/2023, 6:09 AM

## 2023-07-08 NOTE — TOC Progression Note (Signed)
 Transition of Care [facility]) - Progression Note    Patient Details  Name: Bridget Deleon MRN: [id number] Date of Birth: 01-19-1941  Transition [facility] Care [facility]) CM/SW Contact  Marlowe Sax, RN Phone Number: 07/08/2023, 9:54 AM  Clinical Narrative:     Approved Plan Auth ID: [id number] dates:07/07/23-07/11/23 next review date: 07/11/23        Expected Discharge Plan and Services                                               Social Determinants of Health (SDOH) Interventions SDOH Screenings   Food Insecurity: No Food Insecurity (07/06/2023)  Housing: Low Risk  (07/06/2023)  Transportation Needs: No Transportation Needs (07/06/2023)  Utilities: Not At Risk (07/06/2023)  Depression (PHQ2-9): Low Risk  (09/20/2022)  Recent Concern: Depression (PHQ2-9) - High Risk (08/19/2022)  Financial Resource Strain: Low Risk  (05/09/2023)   Received from [facility]  Social Connections: Moderately Isolated (07/06/2023)  Tobacco Use: Medium Risk (07/06/2023)    Readmission Risk Interventions     No data to display

## 2023-07-08 NOTE — Progress Notes (Signed)
 Discharge Summary for Bridget Deleon  Discharge Plan: Patient will be discharged home as per the MD's order. We discussed prescriptions and follow-up appointments with the patient. The prescriptions were provided, and the medication list was explained in detail. Patient confirmed understanding of the instructions.  Skin Assessment: The patient's skin is clean, dry, and intact, with no signs of breakdown or tears. The IV catheter was removed, and the skin remains intact. The site shows no signs or symptoms of complications. A dressing and pressure were applied to the site. Patient reports no pain and has no complaints.  After-Visit Summary: An After-Visit Summary was printed and given to the patient. The following items were sent home with patient:  Two honeycomb bandages Two TED hose   The patient was escorted via wheelchair and discharged home in a private vehicle.  Cherysh Epperly D. Lawerance Bach, RN

## 2023-07-08 NOTE — TOC Progression Note (Addendum)
 Transition of Care [facility] - Progression Note    Patient Details  Name: Bridget Deleon MRN: [id number] Date of Birth: [birth year]  [facility]) CM/SW Contact  Marlowe Sax, RN Phone Number: 07/08/2023, 12:23 PM  Clinical Narrative:      Called son Rayna Sexton at [phone] and was not able to reach, left a VM I called ben the grandson and explained that she will not meet medical criteria for EMS to transport and they can either pay up front for EMS or the family can transport her, he will call Rayna Sexton the patient's son who is at work until 1 PM and then call me back and let me know what they would like to do [place] called me back and let me know that they will transport between 130-2, I notified the nurse      Expected Discharge Plan and Services         Expected Discharge Date: 07/08/23                                     Social Determinants of Health (SDOH) Interventions SDOH Screenings   Food Insecurity: No Food Insecurity (07/06/2023)  Housing: Low Risk  (07/06/2023)  Transportation Needs: No Transportation Needs (07/06/2023)  Utilities: Not At Risk (07/06/2023)  Depression (PHQ2-9): Low Risk  (09/20/2022)  Recent Concern: Depression (PHQ2-9) - High Risk (08/19/2022)  Financial Resource Strain: Low Risk  (05/09/2023)   Received from [facility]  Social Connections: Moderately Isolated (07/06/2023)  Tobacco Use: Medium Risk (07/06/2023)    Readmission Risk Interventions     No data to display
